# Patient Record
Sex: Female | Born: 1955 | Race: White | Hispanic: No | Marital: Married | Smoking: Never smoker
Health system: Southern US, Community
[De-identification: ages and names within clinical notes are randomized; demographics above are authoritative.]

## PROBLEM LIST (undated history)

## (undated) DIAGNOSIS — K219 Gastro-esophageal reflux disease without esophagitis: Secondary | ICD-10-CM

## (undated) DIAGNOSIS — R58 Hemorrhage, not elsewhere classified: Secondary | ICD-10-CM

## (undated) DIAGNOSIS — K8050 Calculus of bile duct without cholangitis or cholecystitis without obstruction: Secondary | ICD-10-CM

## (undated) DIAGNOSIS — R1011 Right upper quadrant pain: Secondary | ICD-10-CM

## (undated) DIAGNOSIS — G43909 Migraine, unspecified, not intractable, without status migrainosus: Secondary | ICD-10-CM

## (undated) DIAGNOSIS — M1990 Unspecified osteoarthritis, unspecified site: Secondary | ICD-10-CM

## (undated) DIAGNOSIS — Z83719 Family history of colon polyps, unspecified: Secondary | ICD-10-CM

## (undated) DIAGNOSIS — E119 Type 2 diabetes mellitus without complications: Secondary | ICD-10-CM

## (undated) DIAGNOSIS — I2510 Atherosclerotic heart disease of native coronary artery without angina pectoris: Secondary | ICD-10-CM

## (undated) DIAGNOSIS — Z8371 Family history of colonic polyps: Secondary | ICD-10-CM

## (undated) DIAGNOSIS — E785 Hyperlipidemia, unspecified: Secondary | ICD-10-CM

## (undated) DIAGNOSIS — R748 Abnormal levels of other serum enzymes: Secondary | ICD-10-CM

## (undated) DIAGNOSIS — K589 Irritable bowel syndrome without diarrhea: Secondary | ICD-10-CM

## (undated) HISTORY — PX: ABDOMINAL HYSTERECTOMY: SHX81

## (undated) HISTORY — DX: Type 2 diabetes mellitus without complications: E11.9

## (undated) HISTORY — DX: Right upper quadrant pain: R10.11

## (undated) HISTORY — PX: TOTAL ABDOMINAL HYSTERECTOMY: SHX209

## (undated) HISTORY — DX: Irritable bowel syndrome, unspecified: K58.9

## (undated) HISTORY — PX: KNEE ARTHROSCOPY: SUR90

## (undated) HISTORY — DX: Hyperlipidemia, unspecified: E78.5

## (undated) HISTORY — PX: TONSILLECTOMY: SUR1361

## (undated) HISTORY — DX: Family history of colon polyps, unspecified: Z83.719

## (undated) HISTORY — DX: Calculus of bile duct without cholangitis or cholecystitis without obstruction: K80.50

## (undated) HISTORY — DX: Abnormal levels of other serum enzymes: R74.8

## (undated) HISTORY — PX: CARDIAC CATHETERIZATION: SHX172

## (undated) HISTORY — DX: Family history of colonic polyps: Z83.71

## (undated) HISTORY — PX: CARPAL TUNNEL RELEASE: SHX101

## (undated) HISTORY — DX: Atherosclerotic heart disease of native coronary artery without angina pectoris: I25.10

## (undated) HISTORY — DX: Gastro-esophageal reflux disease without esophagitis: K21.9

## (undated) HISTORY — DX: Migraine, unspecified, not intractable, without status migrainosus: G43.909

---

## 1998-11-06 DIAGNOSIS — E119 Type 2 diabetes mellitus without complications: Secondary | ICD-10-CM

## 1998-11-06 HISTORY — DX: Type 2 diabetes mellitus without complications: E11.9

## 2007-03-14 ENCOUNTER — Ambulatory Visit: Payer: Self-pay

## 2008-01-13 ENCOUNTER — Inpatient Hospital Stay: Payer: Self-pay | Admitting: Internal Medicine

## 2008-01-13 ENCOUNTER — Other Ambulatory Visit: Payer: Self-pay

## 2008-03-09 ENCOUNTER — Ambulatory Visit: Payer: Self-pay | Admitting: Rheumatology

## 2008-03-19 ENCOUNTER — Ambulatory Visit: Payer: Self-pay

## 2008-04-03 ENCOUNTER — Ambulatory Visit: Payer: Self-pay | Admitting: Specialist

## 2009-03-24 ENCOUNTER — Ambulatory Visit: Payer: Self-pay

## 2010-03-28 ENCOUNTER — Ambulatory Visit: Payer: Self-pay | Admitting: Unknown Physician Specialty

## 2010-04-14 ENCOUNTER — Ambulatory Visit: Payer: Self-pay

## 2010-12-03 ENCOUNTER — Emergency Department: Payer: Self-pay | Admitting: Emergency Medicine

## 2011-03-11 ENCOUNTER — Observation Stay: Payer: Self-pay | Admitting: Family Medicine

## 2011-03-11 DIAGNOSIS — R079 Chest pain, unspecified: Secondary | ICD-10-CM

## 2011-03-12 DIAGNOSIS — R079 Chest pain, unspecified: Secondary | ICD-10-CM

## 2011-04-19 ENCOUNTER — Ambulatory Visit: Payer: Self-pay

## 2011-06-07 ENCOUNTER — Other Ambulatory Visit: Payer: Self-pay | Admitting: Physician Assistant

## 2012-04-25 ENCOUNTER — Ambulatory Visit: Payer: Self-pay

## 2012-08-06 LAB — URINALYSIS, COMPLETE
Bilirubin,UR: NEGATIVE
Blood: NEGATIVE
Glucose,UR: NEGATIVE mg/dL (ref 0–75)
Ketone: NEGATIVE
Leukocyte Esterase: NEGATIVE
Nitrite: NEGATIVE
Ph: 5 (ref 4.5–8.0)
Protein: NEGATIVE
RBC,UR: <1 /HPF (ref 0–5)
Specific Gravity: 1.011 (ref 1.003–1.030)
Squamous Epithelial: 3
WBC UR: 3 /HPF (ref 0–5)

## 2012-08-06 LAB — COMPREHENSIVE METABOLIC PANEL
Albumin: 4.2 g/dL (ref 3.4–5.0)
Alkaline Phosphatase: 71 U/L (ref 50–136)
Anion Gap: 9 (ref 7–16)
BUN: 8 mg/dL (ref 7–18)
Bilirubin,Total: 0.6 mg/dL (ref 0.2–1.0)
Calcium, Total: 8.8 mg/dL (ref 8.5–10.1)
Chloride: 106 mmol/L (ref 98–107)
Co2: 25 mmol/L (ref 21–32)
Creatinine: 0.48 mg/dL — ABNORMAL LOW (ref 0.60–1.30)
EGFR (African American): >60
EGFR (Non-African Amer.): >60
Glucose: 75 mg/dL (ref 65–99)
Osmolality: 276 (ref 275–301)
Potassium: 3.6 mmol/L (ref 3.5–5.1)
SGOT(AST): 32 U/L (ref 15–37)
SGPT (ALT): 32 U/L (ref 12–78)
Sodium: 140 mmol/L (ref 136–145)
Total Protein: 7.8 g/dL (ref 6.4–8.2)

## 2012-08-06 LAB — CBC
HCT: 36.8 % (ref 35.0–47.0)
HGB: 13.4 g/dL (ref 12.0–16.0)
MCH: 34.6 pg — ABNORMAL HIGH (ref 26.0–34.0)
MCHC: 36.3 g/dL — ABNORMAL HIGH (ref 32.0–36.0)
MCV: 95 fL (ref 80–100)
Platelet: 190 10*3/uL (ref 150–440)
RBC: 3.86 10*6/uL (ref 3.80–5.20)
RDW: 12.5 % (ref 11.5–14.5)
WBC: 10.8 10*3/uL (ref 3.6–11.0)

## 2012-08-06 LAB — LIPASE, BLOOD: Lipase: 187 U/L (ref 73–393)

## 2012-08-07 ENCOUNTER — Observation Stay: Payer: Self-pay

## 2012-08-07 LAB — TSH: Thyroid Stimulating Horm: 2.04 u[IU]/mL

## 2012-08-07 LAB — TROPONIN I
Troponin-I: <0.02 ng/mL
Troponin-I: <0.02 ng/mL

## 2012-08-07 LAB — CK TOTAL AND CKMB (NOT AT ARMC)
CK, Total: 45 U/L (ref 21–215)
CK, Total: 43 U/L (ref 21–215)
CK-MB: 0.7 ng/mL (ref 0.5–3.6)
CK-MB: 0.7 ng/mL (ref 0.5–3.6)

## 2012-08-07 LAB — MAGNESIUM: Magnesium: 1.9 mg/dL

## 2012-08-08 LAB — BASIC METABOLIC PANEL
Anion Gap: 10 (ref 7–16)
BUN: 5 mg/dL — ABNORMAL LOW (ref 7–18)
Calcium, Total: 8 mg/dL — ABNORMAL LOW (ref 8.5–10.1)
Chloride: 108 mmol/L — ABNORMAL HIGH (ref 98–107)
Co2: 25 mmol/L (ref 21–32)
Creatinine: 0.54 mg/dL — ABNORMAL LOW (ref 0.60–1.30)
EGFR (African American): >60
EGFR (Non-African Amer.): >60
Glucose: 83 mg/dL (ref 65–99)
Osmolality: 281 (ref 275–301)
Potassium: 3.3 mmol/L — ABNORMAL LOW (ref 3.5–5.1)
Sodium: 143 mmol/L (ref 136–145)

## 2012-08-08 LAB — CBC WITH DIFFERENTIAL/PLATELET
Basophil #: 0 10*3/uL (ref 0.0–0.1)
Basophil %: 0.6 %
Eosinophil #: 0.2 10*3/uL (ref 0.0–0.7)
Eosinophil %: 2.6 %
HCT: 32.6 % — ABNORMAL LOW (ref 35.0–47.0)
HGB: 11.2 g/dL — ABNORMAL LOW (ref 12.0–16.0)
Lymphocyte #: 2 10*3/uL (ref 1.0–3.6)
Lymphocyte %: 28.6 %
MCH: 32.4 pg (ref 26.0–34.0)
MCHC: 34.2 g/dL (ref 32.0–36.0)
MCV: 95 fL (ref 80–100)
Monocyte #: 0.7 x10 3/mm (ref 0.2–0.9)
Monocyte %: 10 %
Neutrophil #: 4.1 10*3/uL (ref 1.4–6.5)
Neutrophil %: 58.2 %
Platelet: 138 10*3/uL — ABNORMAL LOW (ref 150–440)
RBC: 3.44 10*6/uL — ABNORMAL LOW (ref 3.80–5.20)
RDW: 12.4 % (ref 11.5–14.5)
WBC: 7.1 10*3/uL (ref 3.6–11.0)

## 2012-08-08 LAB — LIPID PANEL
Cholesterol: 97 mg/dL (ref 0–200)
HDL Cholesterol: 27 mg/dL — ABNORMAL LOW (ref 40–60)
Ldl Cholesterol, Calc: 50 mg/dL (ref 0–100)
Triglycerides: 98 mg/dL (ref 0–200)
VLDL Cholesterol, Calc: 20 mg/dL (ref 5–40)

## 2012-08-08 LAB — CK TOTAL AND CKMB (NOT AT ARMC)
CK, Total: 34 U/L (ref 21–215)
CK-MB: <0.5 ng/mL — ABNORMAL LOW (ref 0.5–3.6)

## 2012-08-08 LAB — TROPONIN I: Troponin-I: <0.02 ng/mL

## 2012-08-10 LAB — BASIC METABOLIC PANEL
Anion Gap: 11 (ref 7–16)
BUN: 6 mg/dL — ABNORMAL LOW (ref 7–18)
Calcium, Total: 7.9 mg/dL — ABNORMAL LOW (ref 8.5–10.1)
Chloride: 110 mmol/L — ABNORMAL HIGH (ref 98–107)
Co2: 24 mmol/L (ref 21–32)
Creatinine: 0.46 mg/dL — ABNORMAL LOW (ref 0.60–1.30)
EGFR (African American): >60
EGFR (Non-African Amer.): >60
Glucose: 90 mg/dL (ref 65–99)
Osmolality: 286 (ref 275–301)
Potassium: 3.4 mmol/L — ABNORMAL LOW (ref 3.5–5.1)
Sodium: 145 mmol/L (ref 136–145)

## 2012-08-10 LAB — CBC WITH DIFFERENTIAL/PLATELET
Basophil #: 0 10*3/uL (ref 0.0–0.1)
Basophil %: 0.6 %
Eosinophil #: 0.2 10*3/uL (ref 0.0–0.7)
Eosinophil %: 2.4 %
HCT: 31.6 % — ABNORMAL LOW (ref 35.0–47.0)
HGB: 11.2 g/dL — ABNORMAL LOW (ref 12.0–16.0)
Lymphocyte #: 1.7 10*3/uL (ref 1.0–3.6)
Lymphocyte %: 24.4 %
MCH: 33.2 pg (ref 26.0–34.0)
MCHC: 35.6 g/dL (ref 32.0–36.0)
MCV: 93 fL (ref 80–100)
Monocyte #: 0.6 x10 3/mm (ref 0.2–0.9)
Monocyte %: 8.5 %
Neutrophil #: 4.5 10*3/uL (ref 1.4–6.5)
Neutrophil %: 64.1 %
Platelet: 153 10*3/uL (ref 150–440)
RBC: 3.38 10*6/uL — ABNORMAL LOW (ref 3.80–5.20)
RDW: 12.2 % (ref 11.5–14.5)
WBC: 7 10*3/uL (ref 3.6–11.0)

## 2012-08-13 LAB — PATHOLOGY REPORT

## 2012-10-24 ENCOUNTER — Ambulatory Visit: Payer: Self-pay

## 2013-08-12 ENCOUNTER — Emergency Department: Payer: Self-pay | Admitting: Emergency Medicine

## 2013-08-13 LAB — URINALYSIS, COMPLETE
Bilirubin,UR: NEGATIVE
Blood: NEGATIVE
Glucose,UR: 500 mg/dL (ref 0–75)
Leukocyte Esterase: NEGATIVE
Nitrite: NEGATIVE
Ph: 5 (ref 4.5–8.0)
Protein: 100
RBC,UR: NONE SEEN /HPF (ref 0–5)
Specific Gravity: 1.04 (ref 1.003–1.030)

## 2013-08-13 LAB — COMPREHENSIVE METABOLIC PANEL
Albumin: 4.2 g/dL (ref 3.4–5.0)
Alkaline Phosphatase: 106 U/L (ref 50–136)
Anion Gap: 6 — ABNORMAL LOW (ref 7–16)
BUN: 13 mg/dL (ref 7–18)
Bilirubin,Total: 0.6 mg/dL (ref 0.2–1.0)
Calcium, Total: 9.2 mg/dL (ref 8.5–10.1)
Chloride: 101 mmol/L (ref 98–107)
Co2: 28 mmol/L (ref 21–32)
Creatinine: 0.61 mg/dL (ref 0.60–1.30)
EGFR (African American): >60
EGFR (Non-African Amer.): >60
Glucose: 313 mg/dL — ABNORMAL HIGH (ref 65–99)
Osmolality: 282 (ref 275–301)
Potassium: 3.5 mmol/L (ref 3.5–5.1)
SGOT(AST): 47 U/L — ABNORMAL HIGH (ref 15–37)
SGPT (ALT): 59 U/L (ref 12–78)
Sodium: 135 mmol/L — ABNORMAL LOW (ref 136–145)
Total Protein: 7.9 g/dL (ref 6.4–8.2)

## 2013-08-13 LAB — CBC
HCT: 41.9 % (ref 35.0–47.0)
HGB: 14.6 g/dL (ref 12.0–16.0)
MCH: 32.1 pg (ref 26.0–34.0)
MCHC: 34.9 g/dL (ref 32.0–36.0)
MCV: 92 fL (ref 80–100)
Platelet: 191 10*3/uL (ref 150–440)
RBC: 4.54 10*6/uL (ref 3.80–5.20)
RDW: 12 % (ref 11.5–14.5)
WBC: 9.6 10*3/uL (ref 3.6–11.0)

## 2014-04-03 DIAGNOSIS — R0789 Other chest pain: Secondary | ICD-10-CM | POA: Insufficient documentation

## 2014-05-20 ENCOUNTER — Ambulatory Visit: Payer: Self-pay | Admitting: Internal Medicine

## 2014-06-06 DIAGNOSIS — G43909 Migraine, unspecified, not intractable, without status migrainosus: Secondary | ICD-10-CM | POA: Insufficient documentation

## 2014-07-09 ENCOUNTER — Emergency Department: Payer: Self-pay | Admitting: Emergency Medicine

## 2014-07-09 LAB — COMPREHENSIVE METABOLIC PANEL
Albumin: 4.1 g/dL (ref 3.4–5.0)
Alkaline Phosphatase: 77 U/L
Anion Gap: 14 (ref 7–16)
BUN: 14 mg/dL (ref 7–18)
Bilirubin,Total: 0.6 mg/dL (ref 0.2–1.0)
Calcium, Total: 9.1 mg/dL (ref 8.5–10.1)
Chloride: 102 mmol/L (ref 98–107)
Co2: 21 mmol/L (ref 21–32)
Creatinine: 0.94 mg/dL (ref 0.60–1.30)
EGFR (African American): >60
EGFR (Non-African Amer.): >60
Glucose: 428 mg/dL — ABNORMAL HIGH (ref 65–99)
Osmolality: 293 (ref 275–301)
Potassium: 3.7 mmol/L (ref 3.5–5.1)
SGOT(AST): 50 U/L — ABNORMAL HIGH (ref 15–37)
SGPT (ALT): 83 U/L — ABNORMAL HIGH
Sodium: 137 mmol/L (ref 136–145)
Total Protein: 7.6 g/dL (ref 6.4–8.2)

## 2014-07-09 LAB — CBC WITH DIFFERENTIAL/PLATELET
Basophil #: 0.1 10*3/uL (ref 0.0–0.1)
Basophil %: 0.5 %
Eosinophil #: 0.1 10*3/uL (ref 0.0–0.7)
Eosinophil %: 0.3 %
HCT: 47.5 % — ABNORMAL HIGH (ref 35.0–47.0)
HGB: 15.6 g/dL (ref 12.0–16.0)
Lymphocyte #: 2.2 10*3/uL (ref 1.0–3.6)
Lymphocyte %: 12 %
MCH: 31.6 pg (ref 26.0–34.0)
MCHC: 32.9 g/dL (ref 32.0–36.0)
MCV: 96 fL (ref 80–100)
Monocyte #: 1 x10 3/mm — ABNORMAL HIGH (ref 0.2–0.9)
Monocyte %: 5.2 %
Neutrophil #: 15.2 10*3/uL — ABNORMAL HIGH (ref 1.4–6.5)
Neutrophil %: 82 %
Platelet: 250 10*3/uL (ref 150–440)
RBC: 4.94 10*6/uL (ref 3.80–5.20)
RDW: 12.1 % (ref 11.5–14.5)
WBC: 18.5 10*3/uL — ABNORMAL HIGH (ref 3.6–11.0)

## 2014-07-09 LAB — TROPONIN I: Troponin-I: <0.02 ng/mL

## 2014-07-09 LAB — URINALYSIS, COMPLETE
Bacteria: NONE SEEN
Bilirubin,UR: NEGATIVE
Blood: NEGATIVE
Glucose,UR: >=500 mg/dL (ref 0–75)
Leukocyte Esterase: NEGATIVE
Nitrite: NEGATIVE
Ph: 5 (ref 4.5–8.0)
Protein: NEGATIVE
RBC,UR: 1 /HPF (ref 0–5)
Specific Gravity: 1.035 (ref 1.003–1.030)
Squamous Epithelial: 1
WBC UR: 1 /HPF (ref 0–5)

## 2014-07-09 LAB — LIPASE, BLOOD: Lipase: 137 U/L (ref 73–393)

## 2014-10-21 ENCOUNTER — Ambulatory Visit: Payer: Self-pay | Admitting: Internal Medicine

## 2014-11-06 ENCOUNTER — Ambulatory Visit: Payer: Self-pay | Admitting: Internal Medicine

## 2014-12-07 ENCOUNTER — Ambulatory Visit: Payer: Self-pay | Admitting: Internal Medicine

## 2015-02-10 ENCOUNTER — Emergency Department
Admit: 2015-02-10 | Disposition: A | Discharge status: Home / Self Care | Payer: Self-pay | Admitting: Emergency Medicine

## 2015-02-23 NOTE — Consult Note (Signed)
PATIENT NAME:  Brianna Leon, Brianna Leon MR#:  [id number] DATE OF BIRTH:  12-Oct-1956  DATE OF CONSULTATION:  08/08/2012  REFERRING PHYSICIAN:   CONSULTING PHYSICIAN:  Keturah Barre, NP  PRIMARY CARE PHYSICIAN: Einar Crow, MD  HISTORY OF PRESENT ILLNESS: [name] is a very pleasant 59 year old Caucasian woman who was admitted with abdominal pain for three days. Per report, she was in her usual state of health until three days prior to admission. Gastroenterology has been consulted at the request of Dr. Gaynelle Cage to evaluate the same. Her history is significant for diabetes, hyperlipidemia, and GERD. Her abdominal pain started Sunday evening after a steak and baked potato dinner at [facility]. She does report that last Friday she had some limited nausea, vomiting, and diarrhea which resolved on Saturday. Sunday evening she states she ate steak, baked potato, and a salad with onset of right-sided abdominal pain and nausea that same evening. She states pain progressively increased over Tuesday and Wednesday so she came to the emergency department and was subsequently admitted. She has had gallbladder evaluated with ultrasound and HIDA. There were no signs of gallstones or cholecystitis. The patient does report some cramping during the HIDA test, although this was done without CCK. Her liver panel has been normal and states her pain is better today after pain medications, but nausea has persisted, and is receiving IV Protonix here but for some reason is not taking PPI therapy at home despite history of GERD. She states no NSAID use other than 81 mg of aspirin daily. Greasy foods and salads give her abdominal pain and nausea. She denies black stools, vomiting, bloating, difficulty swallowing, and problems with recent acid reflux. She does have an appointment on 09/10/2012 with Dr. Mechele Collin in regards to 3 or 4 episodes of small amount of bright red rectal bleeding, which has not recurred recently.  She did have EGD in 2001 which found H. pylori gastritis and was treated with Flagyl and Biaxin. She does state that the abdominal pain is more on the side, in the flank, and sometimes in her lower back and it hurts to turn over. She states with onset of lower back pain about a year ago her primary physician ordered an x-ray and prescribed her some pain medications that she states made her ill and no longer takes. She states recent evaluation of ovaries by Dr. Luella Cook due to this complaint and they were normal. She states her kidneys were also evaluated and found to be normal.   REVIEW OF SYSTEMS: A 10-point review is unremarkable, other than what is noted above.     PAST MEDICAL HISTORY:  1. Hyperlipidemia.  2. Diabetes. 3. Gastroesophageal reflux disease.   PAST SURGICAL HISTORY:  1. Tonsillectomy. 2. Hysterectomy. 3. Did undergo cardiac catheterization for some complaints of chest pain by Dr. Juliann Pares approximately 5 to 6 months ago. This revealed no significant coronary artery disease.   SOCIAL HISTORY: She works as a Lawyer, lives with husband. No alcohol, tobacco, or illicits.   FAMILY HISTORY: Significant for colon polyps in her father. No history of colorectal cancer, peptic ulcer disease, or liver disease.   ALLERGIES: Penicillin and Lipitor.   MEDICATIONS:  1. Simvastatin 40 mg p.o. daily.  2. Xanax 0.25 mg p.o. daily p.r.n. 3. Actos 45 mg p.o. daily.  4. ASA 81 mg p.o. daily.  5. Glimepiride p.o. daily.  6. Victoza 1.8 injection daily.   MOST RECENT LABS/RADIOLOGIC STUDIES: Glucose 83, BUN 5, creatinine 0.54, sodium 143, potassium  3.3, chloride 108, GFR greater than 60, and calcium 8. Magnesium 1.5. Lipase 187. Cholesterol panel was normal with the exception of decreased HDL of 27. Her hepatic panel was normal. Cardiac enzyme testing x3 has been negative. TSH 2.04. WBC 7.1, hemoglobin 11.2, hematocrit 32.6, and platelet count 138. Normocytic red cells. Normal distribution of  white cells.   CT of chest was done for concerns of pulmonary embolism. This was not found.  Rib x-rays were negative.  Abdominal ultrasound demonstrated a normal liver, normal gallbladder, and normal size common bile duct.   CT with IV but no oral contrast demonstrated an accessory spleen, was essentially negative in other respects.  HIDA without CCK was done yesterday and had no unusual findings.   PHYSICAL EXAMINATION:   VITAL SIGNS: Most recent vital signs - temperature 97.8, pulse 68, respiratory rate 18, blood pressure 124/72, and oxygen saturation 97%.   GENERAL: Well-appearing, pleasant Caucasian woman lying in bed in no acute distress.   HEENT: Normocephalic, atraumatic. Sclerae anicteric. No redness, drainage, or inflammation to the eyes or nares. Oral mucous membranes are pink and moist.   NECK: Supple. No thyromegaly, JVD, or adenopathy.   CHEST: Respirations eupneic. Lungs are clear to auscultation bilaterally.   CARDIAC: S1 and S2, regular rate and rhythm. No MRG. Peripheral pulses 2+. No appreciable edema.   ABDOMEN: Nondistended. Bowel sounds x4. Minimal tenderness to the right lateral upper quadrant. No guarding, rebound tenderness, peritoneal signs, or hernias.   RECTAL: Stool brown, heme-negative. Nontender.   GENITOURINARY: Deferred.   EXTREMITIES: Moves all extremities well x4. No clubbing or cyanosis. Strength five out of five. Sensation intact.   NEUROLOGICAL: Cranial nerves II through XII grossly intact. Alert and oriented x3. Speech clear. No facial droop.   PSYCHIATRIC: Cooperative, logical thought, good memory skills, pleasant.   IMPRESSION AND PLAN: Sudden onset of right-sided abdominal pain and nausea after fatty meal with negative partial gallbladder work-up. Noted national shortage of CCK. Differentials also include gastritis, biliary colic, and dyskinesia. Agree with PPI therapy, recommend twice a day. Abdomen soft and nondistended with mild right  lateral quadrant tenderness. She is heme-negative. Stool is heme-negative. She may benefit from EGD and H. pylori test. She should keep her appointment with Dr. Mechele CollinElliott regarding her stools. At this point, I do not think that her lower back pain is contributory to this current issue.   These services were provided by Vevelyn Pathristiane Channin Agustin, MSN, NPC in collaboration with Barnetta ChapelMartin Skulskie, MD, with whom I have discussed this patient in full.  ____________________________ Keturah Barrehristiane H. Lashana Spang, NP chl:slb D: 08/09/2012 15:23:00 ET T: 08/09/2012 15:41:32 ET JOB#: [id number]  cc: Keturah Barrehristiane H. Rilan Eiland, NP, <Dictator> Marya AmslerMarshall W. Dareen PianoAnderson, MD [facility] HospitalCHRISTIANE H Colonel Krauser FNP ELECTRONICALLY SIGNED 08/24/2012 7:46

## 2015-02-23 NOTE — Discharge Summary (Signed)
PATIENT NAME:  Brianna Leon, Brianna Leon MR#:  [id number] DATE OF BIRTH:  11-07-1955  DATE OF ADMISSION:  08/07/2012 DATE OF DISCHARGE:  08/10/2012  PRIMARY CARE PHYSICIAN: Dr. Einar CrowMarshall Anderson  CONSULTING GI DOCTOR:  Dr. Marva PandaSkulskie    DISCHARGE DIAGNOSES:  1. Gastritis.  2. Abdominal pain.  3. Diabetes.  HISTORY OF PRESENT ILLNESS:  The patient is a pleasant 59 year old female admitted on 10/02 with a three-day history of progressive abdominal pain. In the ED she had a nondiagnostic CT and ultrasound scan. She was admitted for further evaluation. Her white blood count was slightly elevated at 11 but her comprehensive panel, urine, and EKG were normal. Her lipase was normal.   HOSPITAL COURSE BY ISSUE: .  1. Abdominal pain: The patient had negative urinalysis,  negative right upper quadrant ultrasound, normal white count, negative lipase, normal CT scan. Her troponins were negative. Thyroid was negative. She had a normal HIDA scan. She also had a CT of her chest which was negative for pulmonary embolism or thoracic abnormalities. She was seen by GI who did an endoscopy, which showed a normal esophagus but erosive gastritis and duodenitis. For this she was started on high-dose Protonix 40 mg twice a day. Biopsies are pending. She also had a bone scan done, which revealed some focal increased uptake in the right fourth rib, which may be posttraumatic. There was also some mild increase in the uptake at L5, which suggested degenerative changes. On the day of discharge she was pain free and was tolerating a regular diet. She will be discharged on Protonix. She also started Cymbalta and a diclofenac patch, which helped to control her pain.  2. Diabetes: She was continued on outpatient medications and sliding scale insulin for this.   DISCHARGE DIET: ADA diet.    DISCHARGE MEDICATIONS: .  1. Protonix 40 mg twice a day.  2. Cymbalta 60 mg once a day.  3. Diclofenac patch 1.3%, apply q. 12 hours as needed.   4. Pioglitazone 30 mg once a day.  5. Aspirin 81 mg once a day.  6. Simvastatin 40 mg once a day.  7. Imipramine  2 mg, 1 tablet once a day.  8. Victoza 18 mg per 3 mL, inject 1.8 mg subcutaneous once a day in the evening.  9. Ocuvite.  10. One-A-Day Women's Plus tablet.  DISPOSITION:  Discharge to home.  DISCHARGE ACTIVITY LIMITATIONS: As tolerated. Return to work Monday October 7 if tolerated.   DISCHARGE FOLLOWUP:  The  patient will follow up with Dr. Dareen PianoAnderson in 1 to 2 weeks and with Dr. Marva PandaSkulskie in GI within 2 to 4 weeks.   CONDITION ON DISCHARGE: Good.  TIME SPENT: 40 minutes.  ____________________________ Stann Mainlandavid P. Sampson GoonFitzgerald, MD dpf:bjt D: 08/11/2012 12:03:17 ET T: 08/11/2012 12:58:46 ET JOB#: [id number]  cc: Stann Mainlandavid P. Sampson GoonFitzgerald, MD, <Dictator> Marya AmslerMarshall W. Dareen PianoAnderson, MD Maysie Parkhill Sampson GoonFITZGERALD MD ELECTRONICALLY SIGNED 08/13/2012 21:46

## 2015-02-23 NOTE — H&P (Signed)
PATIENT NAME:  Brianna Leon, Brianna Leon MR#:  [id number] DATE OF BIRTH:  25-Apr-1956  DATE OF ADMISSION:  08/06/2012  PRIMARY CARE PHYSICIAN: Dr[facility] Einar CrowMarshall Anderson, [facility]  ER PHYSICIAN: Dr. Manson PasseyBrown  ADMITTING PHYSICIAN: Dr. Tilda FrancoAkuneme   PRESENTING COMPLAINT: Abdominal pain over a three day period.   HISTORY: Patient is a 59 year old lady who was in her usual state of health until three days ago when she started having progressive abdominal pain. Pain is sharp in nature, right-sided, radiating from the medial towards the flank in right upper quadrant area. She states nausea but no vomiting. Denies any fever. No loss of consciousness. Denied PND, orthopnea, pedal edema. No rashes or trauma to the area. No recent long travel or sick contact. No recent change in medication. Of note five days ago patient had episode of diarrhea with vomiting. This lasted over a 48 hour period with persistent nausea, vomiting. Symptoms resolved three days ago then soon afterwards this ongoing pain started. Initially intensity was about 4/5 but now has progressed to 9/10. Admits to occasional dizziness, but no loss of consciousness. No dysuria or frequency. No similar episodes of illness in the family or in the past. For this she had a CT abdomen here in the ER which was nondiagnostic and had a right upper quadrant ultrasound which shows no gallbladder or liver pathology. She was referred to hospitalist for further evaluation and pain control. Patient stated that pain gets worse when she eats greasy food. No known relieving factors.  REVIEW OF SYSTEMS: CONSTITUTIONAL: Denies fever, fatigue, weakness. Positive for weight loss but no weight gain. EYES: No redness or blurred vision. ENT: No tinnitus, epistaxis or difficulty swallowing. RESPIRATORY: No cough, wheezing. CARDIOVASCULAR: Denies any chest pain, PND, orthopnea, arrhythmia but admits to occasional exertional dizziness over the last few days. GASTROINTESTINAL: Positive for  nausea but no recent vomiting or diarrhea. Positive for abdominal pain which is mostly right sided. Denies hematemesis or change in bowel habits. GENITOURINARY: Denies dysuria, frequency, or incontinence. ENDOCRINE: No polyuria, polydipsia, heat or cold intolerance. HEMATOLOGIC: Denies anemia, easy bruising, bleeding, or swollen glands. SKIN: No rashes, change in hair or skin texture. MUSCULOSKELETAL: No joint pain, redness, swelling, or limited activity. NEURO: No numbness, weakness, tremors or vertigo. PSYCH: No anxiety or depression.   PAST MEDICAL HISTORY:  1. Hyperlipidemia.  2. Type 2 diabetes.  3. Gastroesophageal reflux disease.  PAST SURGICAL HISTORY:  1. Tonsillectomy.  2. Hysterectomy.  3. Patient had a cardiac cauterization about five months ago which showed no significant coronary artery disease.    SOCIAL HISTORY: Patient works as a LawyerCNA. No alcohol, tobacco, or recreational drug use. Lives at home with her family.   FAMILY HISTORY: Positive for coronary artery disease in her father.   ALLERGIES: Penicillin which gives her nausea, vomiting. Lipitor which gives her muscle weakness and GI upset.   MEDICATIONS:  1. Simvastatin 40 mg daily.  2. Alprazolam 0.25 mg daily p.r.n.  3. Protonix 40 mg daily.  4. Actos 45 mg daily.  5. Aspirin 81 mg a day.   PHYSICAL EXAMINATION:  VITAL SIGNS: Temperature 98.4, pulse 83, respiratory rate 18, blood pressure 122/57, oxygen saturation 99% on room.   GENERAL: Middle age lady, overweight, lying on the gurney awake, alert, oriented to time, place and person, in no distress.   HEENT: Atraumatic, normocephalic. Pupils equal, reactive to light and accommodation. Extraocular movements intact. Mucous membranes pink, moist.   NECK: Supple. No JV distention.   CHEST: Good air entry.  Clear to auscultation.   HEART: Regular rate and rhythm. No murmur.   ABDOMEN: Full, moves with respiration. Has right lower and right upper quadrant  tenderness. No guarding. Bowel sounds normoactive. No organomegaly.   EXTREMITIES: No edema. No clubbing. No deformity.   NEUROLOGICAL: No focal deficits.   PSYCH: Affect appropriate to situation.   LABORATORY, DIAGNOSTIC AND RADIOLOGICAL DATA: There is no EKG. CT abdomen and pelvis shows no acute abnormality. Right upper quadrant ultrasound shows no liver or gallbladder pathology. CBC: White count 11, hemoglobin 13, platelets 190. Chemistry shows sodium 140, potassium 3.6, creatinine 0.4, BUN 8, anion gap 9, glucose 75, calcium 8.8, lipase 187. LFTs normal. Urinalysis was negative for nitrites or leukocyte esterase, WBCs 3 and bacteria 3. Data also shows echocardiogram from one year ago which was normal. Lipase 187.   IMPRESSION:  1. Intractable pain with nausea and vomiting, query cause.  2. Type 2 diabetes, stable.  3. Hyperlipidemia, stable.  4. Gastroesophageal reflux disease.   PLAN: Admit to general medical floor under observation for serial cardiac enzymes. Check TSH, magnesium. Consider HIDA scan. IV fluid for rehydration, n.p.o. for now. Stool for Clostridium difficile. Protonix for GI prophylaxis. Deep vein thrombosis prophylaxis with Lovenox. CODE STATUS: FULL CODE. Will transfer to Dr. Ewell Poe service in the morning for possible HIDA scan.   TOTAL PATIENT CARE TIME: 50 minutes.   ____________________________ Floy Sabina Tilda Franco, MD mia:cms D: 08/07/2012 05:47:17 ET T: 08/07/2012 07:29:03 ET  JOB#: [id number] cc: Tammi Boulier I. Tilda Franco, MD, <Dictator> Marya Amsler. Dareen Piano, MD Margaret Pyle MD ELECTRONICALLY SIGNED 08/09/2012 0:24

## 2015-02-23 NOTE — Consult Note (Signed)
Brief Consult Note: Diagnosis: abdominal pain.   Patient was seen by consultant.   Consult note dictated.   Comments: Appreciate consult for 59 y/o caucasian woman with history of DM, HL, GERD, admitted with abdominal pain and nausea after steak dinner at [facility], for evaluation of the same. Patient reports that last Friday she had some limited NVD which resolved on Saturday. Sunday evening ate steak, baked potato, and salad, with onset of right sided abdominal pain & nausea that same evening. States pain progressively increased over Tues/Wed, so she came to the ED. Has had gallbladder evaluated with US and HIDA. No signs of gallstones/cholecystitis were found, but patient does report cramping during HIDA test, although done without CCK. Liver panel normal.Pain better today after pain meds, but nausea persistent. Is receiving Protonix IV here, but for some reason is not taking this at home, despite history of GERD. States no NSAID use other than 81mg  po daily. Greasy foods and salads give her abdominal pain and nausea.  Denies black stools, vomiting, bloating, difficulty swallowing, problems with recent acid reflux. Has appt 11/5 w/ Dr Markham JordanElliot in regards to 3 or 4 episodes of small amt bright red rectal bleeding.  Did have EGD 2001 that found H pylori gastritis, treated with Flagyl/Biaxin. Does state that the abdominal pain is more on the side and the flank, sometimes in her lower back, hurts to turn over. States onset of lower back pain about 2953yr ago, pmd ordered Xray and rx;d pain medication.States  recent eval of  ovaries by Dr Luella Cookosenow d/t this complaint, & are normal;  kidneys were also eval'd  normal.   Impression/ plan: sudden onset of abd pain, nausea after fatty meal, w/ negative partial gb wkup. Noted nt'l shortage of CCK. Diffs also include gastritis, biliary colic/dyskinesia. Agree w/ PPI therapy, rec bid. Abd soft/nondistended with mild right lateral quadrant tenderness. She is heme  negative. Stool is  heme neg. May benefit from EGD, H pylori test. Should keep appt w/ Dr Markham JordanElliot re stools Do not think that lower back pain is contributory to this current episode..  Electronic Signatures: Vevelyn PatLondon, Rucker Pridgeon H (NP)  (Signed 03-Oct-13 14:50)  Authored: Brief Consult Note   Last Updated: 03-Oct-13 14:50 by Keturah BarreLondon, Kordelia Severin H (NP)

## 2015-02-23 NOTE — Consult Note (Signed)
Brief Consult Note: Diagnosis: right upper and right lower abdominal apin, n/v.   Patient was seen by consultant.   Consult note dictated.   Recommend to proceed with surgery or procedure.   Recommend further assessment or treatment.   Orders entered.   Comments: Patient seen and examined, please see full GI consult.  Patietn admitted with n/v ruq/right abd pain.  Multiple studies uninformative, unfortunately CCK not available to test biliary dyskinesia.  Pain is medial to lateral ruq.  There is a fhx of gallbladder problems.  Will check h.pylori stool antigen and arrange for egd.  I have discussed the risks benefits and complications of egd to include not limited to bleding infection perforation and sedation and she wishes to proceed.  Will increase ppi to bid. I have discussed the risks benefits and complications of egd to include not limited to bleeding infection perforation and sedation and she wishes to proceed.  Electronic Signatures: Barnetta ChapelSkulskie, Martin (MD)  (Signed 03-Oct-13 17:29)  Authored: Brief Consult Note   Last Updated: 03-Oct-13 17:29 by Barnetta ChapelSkulskie, Martin (MD)

## 2015-02-23 NOTE — Consult Note (Signed)
Chief Complaint:   Subjective/Chief Complaint continues with milder nausea no emesis, continued right abdominal pain, less than on admission, mostly ruq toward epigastrum   VITAL SIGNS/ANCILLARY NOTES: **Vital Signs.:   04-Oct-13 07:30   Vital Signs Type Routine   Temperature Temperature (F) 97.9   Celsius 36.6   Temperature Source Oral   Pulse Pulse 59   Respirations Respirations 20   Systolic BP Systolic BP 502   Diastolic BP (mmHg) Diastolic BP (mmHg) 68   Mean BP 82   Pulse Ox % Pulse Ox % 94   Pulse Ox Activity Level  At rest   Oxygen Delivery Room Air/ 21 %  *Intake and Output.:   04-Oct-13 04:25   Grand Totals Intake:   Output:  125    Net:  -125 24 Hr.:  -100   Weight Type daily   Weight Method Bed   Current Weight (lbs) (lbs) 169   Current Weight (kg) (kg) 76.6   Height (ft) (feet) 5   Height (in) (in) 3   Height (cm) centimeters 160   BSA (m2) 1.7   BMI (kg/m2) 29.9   Urine ml     Out:  125   Urinary Method  Void; Up to BR   Brief Assessment:   Cardiac Regular    Respiratory clear BS    Gastrointestinal details normal Soft  Nondistended  No masses palpable  Bowel sounds normal  No rebound tenderness  tender to palpation right abdomen, mostly ruq toward epigastrum.   Lab Results: Routine Chem:  03-Oct-13 04:15    Glucose, Serum 83   BUN  5   Creatinine (comp)  0.54   Sodium, Serum 143   Potassium, Serum  3.3   Chloride, Serum  108   CO2, Serum 25   Calcium (Total), Serum  8.0   Anion Gap 10   Osmolality (calc) 281   eGFR (African American) >60   eGFR (Non-African American) >60 (eGFR values <8m/min/1.73 m2 may be an indication of chronic kidney disease (CKD). Calculated eGFR is useful in patients with stable renal function. The eGFR calculation will not be reliable in acutely ill patients when serum creatinine is changing rapidly. It is not useful in  patients on dialysis. The eGFR calculation may not be applicable to patients at the low and  high extremes of body sizes, pregnant women, and vegetarians.)   Cholesterol, Serum 97   Triglycerides, Serum 98   HDL (INHOUSE)  27   VLDL Cholesterol Calculated 20   LDL Cholesterol Calculated 50 (Result(s) reported on 08 Aug 2012 at 05:50AM.)  Routine Hem:  03-Oct-13 04:15    WBC (CBC) 7.1   RBC (CBC)  3.44   Hemoglobin (CBC)  11.2   Hematocrit (CBC)  32.6   Platelet Count (CBC)  138   MCV 95   MCH 32.4   MCHC 34.2   RDW 12.4   Neutrophil % 58.2   Lymphocyte % 28.6   Monocyte % 10.0   Eosinophil % 2.6   Basophil % 0.6   Neutrophil # 4.1   Lymphocyte # 2.0   Monocyte # 0.7   Eosinophil # 0.2   Basophil # 0.0 (Result(s) reported on 08 Aug 2012 at 05:39AM.)   Assessment/Plan:  Assessment/Plan:   Assessment 1) right abdominal pain as above, uncertain etiology. multiple tests uninformative.    Plan 1) EGD today.  I have discussed the risks benefits and complicatiosn of egd to include not limited to bleeding infection perforation and  sedation and she wishes to proceed. Further recs to follow.   Electronic Signatures: Loistine Simas (MD)  (Signed 04-Oct-13 14:50)  Authored: Chief Complaint, VITAL SIGNS/ANCILLARY NOTES, Brief Assessment, Lab Results, Assessment/Plan   Last Updated: 04-Oct-13 14:50 by Loistine Simas (MD)

## 2015-03-26 ENCOUNTER — Other Ambulatory Visit: Payer: Self-pay | Admitting: Nurse Practitioner

## 2015-03-26 DIAGNOSIS — Z87898 Personal history of other specified conditions: Secondary | ICD-10-CM | POA: Insufficient documentation

## 2015-03-26 DIAGNOSIS — R1011 Right upper quadrant pain: Secondary | ICD-10-CM

## 2015-03-26 DIAGNOSIS — Z8371 Family history of colonic polyps: Secondary | ICD-10-CM | POA: Insufficient documentation

## 2015-03-26 DIAGNOSIS — R110 Nausea: Secondary | ICD-10-CM

## 2015-03-26 DIAGNOSIS — R748 Abnormal levels of other serum enzymes: Secondary | ICD-10-CM | POA: Insufficient documentation

## 2015-03-26 LAB — HM HEPATITIS C SCREENING LAB: HM Hepatitis Screen: NEGATIVE

## 2015-04-02 ENCOUNTER — Other Ambulatory Visit: Payer: Self-pay

## 2015-04-06 ENCOUNTER — Ambulatory Visit
Admission: RE | Admit: 2015-04-06 | Discharge: 2015-04-06 | Disposition: A | Discharge status: Home / Self Care | Payer: BLUE CROSS/BLUE SHIELD | Source: Ambulatory Visit | Attending: Nurse Practitioner | Admitting: Nurse Practitioner

## 2015-04-06 DIAGNOSIS — R110 Nausea: Secondary | ICD-10-CM | POA: Diagnosis present

## 2015-04-06 DIAGNOSIS — R1011 Right upper quadrant pain: Secondary | ICD-10-CM

## 2015-04-06 MED ORDER — SINCALIDE 5 MCG IJ SOLR
0.0200 ug/kg | Freq: Once | INTRAMUSCULAR | Status: AC
  Administered 2015-04-06: 1.4 ug via INTRAVENOUS
  Filled 2015-04-06: qty 5

## 2015-04-06 MED ORDER — TECHNETIUM TC 99M MEBROFENIN IV KIT
5.2400 | PACK | Freq: Once | INTRAVENOUS | Status: AC | PRN
  Administered 2015-04-06: 5.24 via INTRAVENOUS

## 2015-05-07 LAB — HM PAP SMEAR: HM Pap smear: NORMAL

## 2015-05-18 HISTORY — PX: ESOPHAGOGASTRODUODENOSCOPY: SHX1529

## 2015-05-18 HISTORY — PX: COLONOSCOPY: SHX5424

## 2015-05-18 LAB — HM COLONOSCOPY

## 2015-06-17 DIAGNOSIS — Z0000 Encounter for general adult medical examination without abnormal findings: Secondary | ICD-10-CM | POA: Insufficient documentation

## 2015-07-09 ENCOUNTER — Encounter: Payer: Self-pay | Admitting: *Deleted

## 2016-10-24 ENCOUNTER — Encounter: Payer: Self-pay | Admitting: Emergency Medicine

## 2016-10-24 ENCOUNTER — Emergency Department
Admission: EM | Admit: 2016-10-24 | Discharge: 2016-10-24 | Disposition: A | Discharge status: Home / Self Care | Payer: BLUE CROSS/BLUE SHIELD | Attending: Emergency Medicine | Admitting: Emergency Medicine

## 2016-10-24 DIAGNOSIS — R112 Nausea with vomiting, unspecified: Secondary | ICD-10-CM | POA: Diagnosis not present

## 2016-10-24 DIAGNOSIS — R42 Dizziness and giddiness: Secondary | ICD-10-CM | POA: Diagnosis not present

## 2016-10-24 DIAGNOSIS — R1011 Right upper quadrant pain: Secondary | ICD-10-CM | POA: Insufficient documentation

## 2016-10-24 LAB — CBC WITH DIFFERENTIAL/PLATELET
Basophils Absolute: 0 10*3/uL (ref 0–0.1)
Basophils Relative: 1 %
Eosinophils Absolute: 0 10*3/uL (ref 0–0.7)
Eosinophils Relative: 0 %
HCT: 40.5 % (ref 35.0–47.0)
Hemoglobin: 14.2 g/dL (ref 12.0–16.0)
Lymphocytes Relative: 13 %
Lymphs Abs: 1.1 10*3/uL (ref 1.0–3.6)
MCH: 32.1 pg (ref 26.0–34.0)
MCHC: 35.1 g/dL (ref 32.0–36.0)
MCV: 91.4 fL (ref 80.0–100.0)
Monocytes Absolute: 0.3 10*3/uL (ref 0.2–0.9)
Monocytes Relative: 4 %
Neutro Abs: 6.8 10*3/uL — ABNORMAL HIGH (ref 1.4–6.5)
Neutrophils Relative %: 82 %
Platelets: 199 10*3/uL (ref 150–440)
RBC: 4.43 MIL/uL (ref 3.80–5.20)
RDW: 12.3 % (ref 11.5–14.5)
WBC: 8.3 10*3/uL (ref 3.6–11.0)

## 2016-10-24 LAB — COMPREHENSIVE METABOLIC PANEL
ALT: 28 U/L (ref 14–54)
AST: 23 U/L (ref 15–41)
Albumin: 4.7 g/dL (ref 3.5–5.0)
Alkaline Phosphatase: 55 U/L (ref 38–126)
Anion gap: 10 (ref 5–15)
BUN: 13 mg/dL (ref 6–20)
CO2: 25 mmol/L (ref 22–32)
Calcium: 9.2 mg/dL (ref 8.9–10.3)
Chloride: 100 mmol/L — ABNORMAL LOW (ref 101–111)
Creatinine, Ser: 0.46 mg/dL (ref 0.44–1.00)
GFR calc Af Amer: >60 mL/min (ref >60)
GFR calc non Af Amer: >60 mL/min (ref >60)
Glucose, Bld: 358 mg/dL — ABNORMAL HIGH (ref 65–99)
Potassium: 3.9 mmol/L (ref 3.5–5.1)
Sodium: 135 mmol/L (ref 135–145)
Total Bilirubin: 0.9 mg/dL (ref 0.3–1.2)
Total Protein: 8 g/dL (ref 6.5–8.1)

## 2016-10-24 LAB — TROPONIN I: Troponin I: <0.03 ng/mL (ref <0.03)

## 2016-10-24 MED ORDER — MECLIZINE HCL 25 MG PO TABS: 25.0000 mg | ORAL_TABLET | Freq: Three times a day (TID) | ORAL | 0 refills | Status: DC | PRN

## 2016-10-24 MED ORDER — ONDANSETRON 4 MG PO TBDP: 4.0000 mg | ORAL_TABLET | Freq: Three times a day (TID) | ORAL | 0 refills | Status: DC | PRN

## 2016-10-24 MED ORDER — BLOOD GLUCOSE MONITOR KIT: PACK | 0 refills | Status: DC

## 2016-10-24 MED ORDER — MECLIZINE HCL 25 MG PO TABS
25.0000 mg | ORAL_TABLET | Freq: Once | ORAL | Status: AC
  Administered 2016-10-24: 25 mg via ORAL
  Filled 2016-10-24: qty 1

## 2016-10-24 NOTE — ED Triage Notes (Signed)
C/o nausea and states when she stands or moves her head the room spins.

## 2016-10-24 NOTE — ED Notes (Signed)
Signature pad would only open "read only view" so signature page printed out and pt signed on paper and placed with chart

## 2016-10-24 NOTE — ED Provider Notes (Signed)
[facility] Emergency Department Provider Note  Time seen: 1:18 PM  I have reviewed the triage vital signs and the nursing notes.   HISTORY  Chief Complaint Dizziness    HPI Brianna Leon is a 60 y.o. female who presents to the emergency department for dizziness. According to the patient she states she was feeling well until this morning when she rolled over to turn off her alarm clock. She states acute onset of dizziness like the room was spinning around her. Became very nauseated. States she was able to get up after resting for several minutes she went to her mother's house to unlock the door for her caregiver however once again became extremely dizzy after bending over. Patient states she then became very nauseated with several episodes of vomiting. Denies any headache. Denies focal weakness or numbness. Denies history of vertigo.  History reviewed. No pertinent past medical history.  There are no active problems to display for this patient.   Past Surgical History:  Procedure Laterality Date  . ABDOMINAL HYSTERECTOMY  22 years ago    Prior to Admission medications   Not on File    Allergies  Allergen Reactions  . Metformin And Related Diarrhea  . Penicillins Nausea And Vomiting    No family history on file.  Social History Social History  Substance Use Topics  . Smoking status: Never Smoker  . Smokeless tobacco: Never Used  . Alcohol use Not on file    Review of Systems Constitutional: Negative for feverIntermittent dizziness. Denies any currently. Cardiovascular: Negative for chest pain. Respiratory: Negative for shortness of breath. Gastrointestinal: Negative for abdominal pain. Positive for nausea and vomiting, now resolved. Neurological: Negative for headaches, focal weakness or numbness. 10-point ROS otherwise negative.  ____________________________________________   PHYSICAL EXAM:  VITAL SIGNS: ED Triage Vitals  Enc  Vitals Group     BP 10/24/16 1017 123/60     Pulse Rate 10/24/16 1017 85     Resp 10/24/16 1017 18     Temp 10/24/16 1017 97.7 F (36.5 C)     Temp Source 10/24/16 1017 Oral     SpO2 10/24/16 1017 98 %     Weight 10/24/16 1008 130 lb (59 kg)     Height 10/24/16 1008 5\' 2"  (1.575 m)     Head Circumference --      Peak Flow --      Pain Score 10/24/16 1008 5     Pain Loc --      Pain Edu? --      Excl. in GC? --     Constitutional: Alert and oriented. Well appearing and in no distress. Eyes: Normal exam ENT   Head: Normocephalic and atraumatic   Mouth/Throat: Mucous membranes are moist. Cardiovascular: Normal rate, regular rhythm. No murmur Respiratory: Normal respiratory effort without tachypnea nor retractions. Breath sounds are clear Gastrointestinal: Soft, slight right upper quadrant tenderness to palpation which the patient states is chronic. No rebound or guarding. No distention. Musculoskeletal: Nontender with normal range of motion in all extremities. Neurologic:  Normal speech and language. No gross focal neurologic deficits Skin:  Skin is warm, dry and intact.  Psychiatric: Mood and affect are normal. Speech and behavior are normal.   ____________________________________________    EKG  EKG reviewed and interpreted by myself shows normal sinus rhythm at 85 bpm, narrow QRS, normal axis, normal intervals, no concerning ST changes.  ____________________________________________    INITIAL IMPRESSION / ASSESSMENT AND PLAN / ED COURSE  Pertinent labs & imaging results that were available during my care of the patient were reviewed by me and considered in my medical decision making (see chart for details).  The patient presents the emergency department with dizziness which started after rolling over this morning, exacerbated by certain positions/movements. Patient's symptoms are very suggestive of BPPV. Patient's labs are largely within normal limits besides an  elevated blood glucose. Patient is supposed be taking Lantus at night but lost her glucometer several weeks ago and has not been taking it. Patient's anion gap is normal. I discussed with the patient the need to restart her nighttime Lantus today. I will write her for a glucometer. We will also discharge the patient with meclizine and ENT follow-up. I discussed return precautions for any increased/persistent dizziness, weakness, numbness or headache. Patient agreeable plan.  ____________________________________________   FINAL CLINICAL IMPRESSION(S) / ED DIAGNOSES  Vertigo    Minna AntisKevin Elasha Tess, MD 10/24/16 1323

## 2016-10-24 NOTE — ED Notes (Signed)
Pt to room in wheelchair, standing beside bed to change with no assistance. Pt states "time to time I have frequent pain in my R side." states last night she was hurting in shoulder and back and didn't feel good, woke up this morning and felt dizzy. Was walking around trying to feed cats and states she felt like she was going to pass out. Went inside and tried to drink something. Then began throwing up. Movement made her dizzy and vomit.

## 2016-10-24 NOTE — Discharge Instructions (Signed)
As we discussed please take your meclizine as needed for dizziness. Please follow-up with ENT if you continue to have symptoms in the next 1-2 days. Return to the emergency department for any persistent dizziness, weakness/numbness of any arm or leg, or headache.

## 2016-10-24 NOTE — ED Notes (Addendum)
Pt states R sided pain more with eating, states still has gallbladder, been told she doesn't have gallstones.   Pt states since being here she is feeling better.

## 2016-11-28 ENCOUNTER — Other Ambulatory Visit: Payer: Self-pay | Admitting: Obstetrics & Gynecology

## 2016-11-28 DIAGNOSIS — I2510 Atherosclerotic heart disease of native coronary artery without angina pectoris: Secondary | ICD-10-CM | POA: Diagnosis not present

## 2016-11-28 DIAGNOSIS — E7800 Pure hypercholesterolemia, unspecified: Secondary | ICD-10-CM | POA: Diagnosis not present

## 2016-11-28 DIAGNOSIS — Z1211 Encounter for screening for malignant neoplasm of colon: Secondary | ICD-10-CM | POA: Diagnosis not present

## 2016-11-28 DIAGNOSIS — I10 Essential (primary) hypertension: Secondary | ICD-10-CM | POA: Diagnosis not present

## 2016-11-28 DIAGNOSIS — R079 Chest pain, unspecified: Secondary | ICD-10-CM | POA: Diagnosis not present

## 2016-11-28 DIAGNOSIS — Z01419 Encounter for gynecological examination (general) (routine) without abnormal findings: Secondary | ICD-10-CM | POA: Diagnosis not present

## 2016-11-28 DIAGNOSIS — Z0100 Encounter for examination of eyes and vision without abnormal findings: Secondary | ICD-10-CM | POA: Diagnosis not present

## 2016-11-28 DIAGNOSIS — Z794 Long term (current) use of insulin: Secondary | ICD-10-CM | POA: Diagnosis not present

## 2016-11-28 DIAGNOSIS — Z1231 Encounter for screening mammogram for malignant neoplasm of breast: Secondary | ICD-10-CM

## 2016-11-28 DIAGNOSIS — E119 Type 2 diabetes mellitus without complications: Secondary | ICD-10-CM | POA: Diagnosis not present

## 2016-12-04 DIAGNOSIS — E119 Type 2 diabetes mellitus without complications: Secondary | ICD-10-CM | POA: Diagnosis not present

## 2016-12-04 DIAGNOSIS — I10 Essential (primary) hypertension: Secondary | ICD-10-CM | POA: Diagnosis not present

## 2016-12-04 DIAGNOSIS — I2510 Atherosclerotic heart disease of native coronary artery without angina pectoris: Secondary | ICD-10-CM | POA: Diagnosis not present

## 2016-12-04 DIAGNOSIS — G43909 Migraine, unspecified, not intractable, without status migrainosus: Secondary | ICD-10-CM | POA: Diagnosis not present

## 2017-01-02 ENCOUNTER — Ambulatory Visit
Admission: RE | Admit: 2017-01-02 | Discharge: 2017-01-02 | Disposition: A | Discharge status: Home / Self Care | Payer: BLUE CROSS/BLUE SHIELD | Source: Ambulatory Visit | Attending: Obstetrics & Gynecology | Admitting: Obstetrics & Gynecology

## 2017-01-02 ENCOUNTER — Encounter: Payer: Self-pay | Admitting: Radiology

## 2017-01-02 DIAGNOSIS — Z1231 Encounter for screening mammogram for malignant neoplasm of breast: Secondary | ICD-10-CM | POA: Insufficient documentation

## 2017-04-03 DIAGNOSIS — G43909 Migraine, unspecified, not intractable, without status migrainosus: Secondary | ICD-10-CM | POA: Diagnosis not present

## 2017-04-03 DIAGNOSIS — E119 Type 2 diabetes mellitus without complications: Secondary | ICD-10-CM | POA: Diagnosis not present

## 2017-04-03 DIAGNOSIS — Z794 Long term (current) use of insulin: Secondary | ICD-10-CM | POA: Diagnosis not present

## 2017-04-03 DIAGNOSIS — I2510 Atherosclerotic heart disease of native coronary artery without angina pectoris: Secondary | ICD-10-CM | POA: Diagnosis not present

## 2017-04-03 LAB — LIPID PANEL
Cholesterol: 216 — AB (ref 0–200)
HDL: 45 (ref 35–70)
LDL Cholesterol: 149
Triglycerides: 111 (ref 40–160)

## 2017-04-03 LAB — BASIC METABOLIC PANEL
BUN: 18 (ref 4–21)
Creatinine: 0.5 (ref 0.5–1.1)
Glucose: 290
Potassium: 4.9 (ref 3.4–5.3)
Sodium: 139 (ref 137–147)

## 2017-04-03 LAB — HEMOGLOBIN A1C: Hemoglobin A1C: 9.4

## 2017-04-03 LAB — HEPATIC FUNCTION PANEL
ALT: 18 (ref 7–35)
AST: 14 (ref 13–35)
Alkaline Phosphatase: 48 (ref 25–125)
Bilirubin, Total: 0.7

## 2017-04-03 LAB — MICROALBUMIN, URINE: Microalb, Ur: <7

## 2017-04-30 DIAGNOSIS — E785 Hyperlipidemia, unspecified: Secondary | ICD-10-CM | POA: Insufficient documentation

## 2017-04-30 DIAGNOSIS — I2510 Atherosclerotic heart disease of native coronary artery without angina pectoris: Secondary | ICD-10-CM | POA: Insufficient documentation

## 2017-04-30 DIAGNOSIS — I10 Essential (primary) hypertension: Secondary | ICD-10-CM

## 2017-04-30 DIAGNOSIS — I129 Hypertensive chronic kidney disease with stage 1 through stage 4 chronic kidney disease, or unspecified chronic kidney disease: Secondary | ICD-10-CM | POA: Insufficient documentation

## 2017-04-30 DIAGNOSIS — K219 Gastro-esophageal reflux disease without esophagitis: Secondary | ICD-10-CM | POA: Insufficient documentation

## 2017-04-30 DIAGNOSIS — E119 Type 2 diabetes mellitus without complications: Secondary | ICD-10-CM | POA: Insufficient documentation

## 2017-06-11 ENCOUNTER — Encounter: Payer: Self-pay | Admitting: General Surgery

## 2017-06-11 ENCOUNTER — Other Ambulatory Visit: Payer: Self-pay | Admitting: Family Medicine

## 2017-06-11 ENCOUNTER — Ambulatory Visit
Admission: RE | Admit: 2017-06-11 | Discharge: 2017-06-11 | Disposition: A | Discharge status: Home / Self Care | Payer: BLUE CROSS/BLUE SHIELD | Source: Ambulatory Visit | Attending: Family Medicine | Admitting: Family Medicine

## 2017-06-11 ENCOUNTER — Ambulatory Visit (INDEPENDENT_AMBULATORY_CARE_PROVIDER_SITE_OTHER): Payer: BLUE CROSS/BLUE SHIELD | Admitting: Family Medicine

## 2017-06-11 ENCOUNTER — Encounter: Payer: Self-pay | Admitting: Family Medicine

## 2017-06-11 VITALS — BP 118/73 | HR 75 | Temp 98.3°F | Ht 62.7 in | Wt 131.1 lb

## 2017-06-11 DIAGNOSIS — E118 Type 2 diabetes mellitus with unspecified complications: Secondary | ICD-10-CM | POA: Diagnosis not present

## 2017-06-11 DIAGNOSIS — R918 Other nonspecific abnormal finding of lung field: Secondary | ICD-10-CM | POA: Diagnosis not present

## 2017-06-11 DIAGNOSIS — E782 Mixed hyperlipidemia: Secondary | ICD-10-CM | POA: Diagnosis not present

## 2017-06-11 DIAGNOSIS — R634 Abnormal weight loss: Secondary | ICD-10-CM

## 2017-06-11 DIAGNOSIS — I10 Essential (primary) hypertension: Secondary | ICD-10-CM | POA: Diagnosis not present

## 2017-06-11 DIAGNOSIS — R1011 Right upper quadrant pain: Secondary | ICD-10-CM

## 2017-06-11 DIAGNOSIS — K219 Gastro-esophageal reflux disease without esophagitis: Secondary | ICD-10-CM | POA: Diagnosis not present

## 2017-06-11 DIAGNOSIS — R0781 Pleurodynia: Secondary | ICD-10-CM

## 2017-06-11 MED ORDER — SUCRALFATE 1 GM/10ML PO SUSP: 1.0000 g | Freq: Three times a day (TID) | ORAL | 1 refills | Status: DC

## 2017-06-11 NOTE — Patient Instructions (Addendum)
Gastroparesis °Gastroparesis, also called delayed gastric emptying, is a condition in which food takes longer than normal to empty from the stomach. The condition is usually long-lasting (chronic). °What are the causes? °This condition may be caused by: °· An endocrine disorder, such as hypothyroidism or diabetes. Diabetes is the most common cause of this condition. °· A nervous system disease, such as Parkinson disease or multiple sclerosis. °· Cancer, infection, or surgery of the stomach or vagus nerve. °· A connective tissue disorder, such as scleroderma. °· Certain medicines. ° °In most cases, the cause is not known. °What increases the risk? °This condition is more likely to develop in: °· People with certain disorders, including endocrine disorders, eating disorders, amyloidosis, and scleroderma. °· People with certain diseases, including Parkinson disease or multiple sclerosis. °· People with cancer or infection of the stomach or vagus nerve. °· People who have had surgery on the stomach or vagus nerve. °· People who take certain medicines. °· Women. ° °What are the signs or symptoms? °Symptoms of this condition include: °· An early feeling of fullness when eating. °· Nausea. °· Weight loss. °· Vomiting. °· Heartburn. °· Abdominal bloating. °· Inconsistent blood glucose levels. °· Lack of appetite. °· Acid from the stomach coming up into the esophagus (gastroesophageal reflux). °· Spasms of the stomach. ° °Symptoms may come and go. °How is this diagnosed? °This condition is diagnosed with tests, such as: °· Tests that check how long it takes food to move through the stomach and intestines. These tests include: °? Upper gastrointestinal (GI) series. In this test, X-rays of the intestines are taken after you drink a liquid. The liquid makes the intestines show up better on the X-rays. °? Gastric emptying scintigraphy. In this test, scans are taken after you eat food that contains a small amount of radioactive  material. °? Wireless capsule GI monitoring system. This test involves swallowing a capsule that records information about movement through the stomach. °· Gastric manometry. This test measures electrical and muscular activity in the stomach. It is done with a thin tube that is passed down the throat and into the stomach. °· Endoscopy. This test checks for abnormalities in the lining of the stomach. It is done with a long, thin tube that is passed down the throat and into the stomach. °· An ultrasound. This test can help rule out gallbladder disease or pancreatitis as a cause of your symptoms. It uses sound waves to take pictures of the inside of your body. ° °How is this treated? °There is no cure for gastroparesis. This condition may be managed with: °· Treatment of the underlying condition causing the gastroparesis. °· Lifestyle changes, including exercise and dietary changes. Dietary changes can include: °? Changes in what and when you eat. °? Eating smaller meals more often. °? Eating low-fat foods. °? Eating low-fiber forms of high-fiber foods, such as cooked vegetables instead of raw vegetables. °? Having liquid foods in place of solid foods. Liquid foods are easier to digest. °· Medicines. These may be given to control nausea and vomiting and to stimulate stomach muscles. °· Getting food through a feeding tube. This may be done in severe cases. °· A gastric neurostimulator. This is a device that is inserted into the body with surgery. It helps improve stomach emptying and control nausea and vomiting. ° °Follow these instructions at home: °· Follow your health care provider's instructions about exercise and diet. °· Take medicines only as directed by your health care provider. °Contact a   health care provider if: °· Your symptoms do not improve with treatment. °· You have new symptoms. °Get help right away if: °· You have severe abdominal pain that does not improve with treatment. °· You have nausea that does  not go away. °· You cannot keep fluids down. °This information is not intended to replace advice given to you by your health care provider. Make sure you discuss any questions you have with your health care provider. °Document Released: 10/23/2005 Document Revised: 03/30/2016 Document Reviewed: 10/19/2014 °Elsevier Interactive Patient Education © 2018 [employer] ° °

## 2017-06-11 NOTE — Progress Notes (Signed)
BP 118/73 (BP Location: Left Arm, Patient Position: Sitting, Cuff Size: Normal)   Pulse 75   Temp 98.3 F (36.8 C)   Ht 5' 2.7" (1.593 m)   Wt 131 lb 2 oz (59.5 kg)   SpO2 99%   BMI 23.45 kg/m    Subjective:    Patient ID: Brianna Leon, female    DOB: 1956/09/07, 61 y.o.   MRN: [id number]  HPI: Brianna Leon is a 61 y.o. female who presents today to establish care. Records from her previous provider from January of this year reviewed. Was on cymbalta, glimiperide, lantus, onglyza and simvastatin. Labs last checked in May.  Chief Complaint  Patient presents with  . Weight Loss    Patient was at around 185. Right abdominal pain that goes to her back, diarrhea. Patient states that she had the same symptoms as her and had her gallbladder removed and it the symptoms went away  . Joint Pain    fingers, knees and legs and back   ABDOMINAL PAIN- has had problems with pain in her epigastric region and into her R side for years. Has seen GI about it in the past. She notes that she has had tests done in the past, but not when it was acting up, but then it will come back again. She notes that it tends to happen with eating certain foods- especially greasy and spicy food, but happens with all foods. Salads really tend to make it worse.   Duration: 10+ years Onset: gradual Severity: mild discomfort to severe pain Quality: dull aching Location:  R side and RUQ  Episode duration: constant pain- gets worse with eating, will ease off  Radiation: yes- into her back and into her shoulder Frequency: constant, waxes and wanes Alleviating factors: nothing Aggravating factors: eating anything Status: fluctuating Treatments attempted: antacids, PPI and H2 Blocker- was told that she has had an ulcer in the past Fever: no Nausea: yes Vomiting: yes- at least a couple of times a week Weight loss: yes- over the last 3 years, without effort Decreased appetite: yes Diarrhea: yes Constipation:  no Blood in stool: no Heartburn: yes- indigestion- when that is happening Jaundice: no Rash: no Dysuria/urinary frequency: no Hematuria: no History of sexually transmitted disease: no Recurrent NSAID use: no- tylenol 2-3x a week  HYPERTENSION / HYPERLIPIDEMIA Satisfied with current treatment? yes Duration of hypertension: chronic BP monitoring frequency: not checking BP medication side effects: no Duration of hyperlipidemia: chronic Cholesterol medication side effects: no Cholesterol supplements: none Past cholesterol medications: simvastatin Medication compliance: excellent compliance Aspirin: yes Recent stressors: no Recurrent headaches: no Visual changes: no Palpitations: no Dyspnea: no Chest pain: no Lower extremity edema: no Dizzy/lightheaded: no  DIABETES Hypoglycemic episodes: yes- at night- lowest has been recently has been 60s Polydipsia/polyuria: no Visual disturbance: no Chest pain: no Paresthesias: no Glucose Monitoring: in the AM- has gotten up to 230 and as low as 140 Taking Insulin?: yes Blood Pressure Monitoring: not checking Retinal Examination: Up to Date Foot Exam: Up to Date Diabetic Education: Completed Pneumovax: Up to Date Influenza: Up to Date Aspirin: yes  Active Ambulatory Problems    Diagnosis Date Noted  . CAD (coronary artery disease)   . Hyperlipidemia   . Hypertension   . GERD (gastroesophageal reflux disease)   . DM (diabetes mellitus), type 2 ([place])   . RUQ pain 06/12/2017   Resolved Ambulatory Problems    Diagnosis Date Noted  . No Resolved Ambulatory Problems  Past Medical History:  Diagnosis Date  . CAD (coronary artery disease)   . DM (diabetes mellitus), type 2 ([place])   . Elevated liver enzymes   . Family history of colonic polyps   . GERD (gastroesophageal reflux disease)   . Hyperlipidemia   . IBS (irritable bowel syndrome)   . Migraine    Past Surgical History:  Procedure Laterality Date  . ABDOMINAL  HYSTERECTOMY  22 years ago  . CARDIAC CATHETERIZATION     x 2  . CARPAL TUNNEL RELEASE Right   . COLONOSCOPY  05/18/2015   repeat 5 years  . ESOPHAGOGASTRODUODENOSCOPY  05/18/2015  . KNEE ARTHROSCOPY  2001 and 2009  . TONSILLECTOMY     Outpatient Encounter Prescriptions as of 06/11/2017  Medication Sig  . glimepiride (AMARYL) 2 MG tablet Take 2 mg by mouth daily with breakfast.  . Insulin Glargine (LANTUS SOLOSTAR [place]) Inject into the skin. Inject 20 units into the skin every night  . multivitamin-lutein (OCUVITE-LUTEIN) CAPS capsule Take 1 capsule by mouth daily.  Marland Kitchen OVER THE COUNTER MEDICATION Hearing Support  . saxagliptin HCl (ONGLYZA) 5 MG TABS tablet Take 5 mg by mouth daily.  . simvastatin (ZOCOR) 40 MG tablet Take 40 mg by mouth daily.  . blood glucose meter kit and supplies KIT Dispense based on patient and insurance preference. Use up to four times daily as directed. (FOR ICD-9 250.00, 250.01).  . meclizine (ANTIVERT) 25 MG tablet Take 1 tablet (25 mg total) by mouth 3 (three) times daily as needed for dizziness.  . sucralfate (CARAFATE) 1 GM/10ML suspension Take 10 mLs (1 g total) by mouth 4 (four) times daily -  with meals and at bedtime.  . [DISCONTINUED] ondansetron (ZOFRAN ODT) 4 MG disintegrating tablet Take 1 tablet (4 mg total) by mouth every 8 (eight) hours as needed for nausea or vomiting.   No facility-administered encounter medications on file as of 06/11/2017.    Allergies  Allergen Reactions  . Atorvastatin Other (See Comments)  . Metformin And Related Diarrhea  . Other Hives    Lysol Cleaning Products  . Penicillins Nausea And Vomiting  . Tramadol Nausea Only   Family History  Problem Relation Age of Onset  . Colon polyps Father   . Diabetes Father   . Hypertension Father   . Heart disease Father   . Stroke Father   . Diabetes Mother   . Hypertension Mother   . Diabetes Brother   . Heart failure Brother        pacemaker  . Hypertension Brother   .  Heart attack Brother   . Migraines Daughter   . Lupus Son   . Diabetes Maternal Grandmother   . Heart disease Maternal Grandmother   . Stroke Maternal Grandfather   . Hypertension Paternal Grandmother   . Stroke Paternal Grandfather   . Hyperlipidemia Brother   . Breast cancer Neg Hx    Social History   Social History  . Marital status: Married    Spouse name: N/A  . Number of children: N/A  . Years of education: N/A   Occupational History  . Not on file.   Social History Main Topics  . Smoking status: Never Smoker  . Smokeless tobacco: Never Used  . Alcohol use No  . Drug use: No  . Sexual activity: Yes   Other Topics Concern  . Not on file   Social History Narrative  . No narrative on file   Review of Systems  Constitutional: Positive for appetite change, fatigue and unexpected weight change. Negative for activity change, chills, diaphoresis and fever.  HENT: Negative.   Respiratory: Negative.   Cardiovascular: Negative.   Gastrointestinal: Positive for abdominal pain and constipation. Negative for abdominal distention, anal bleeding, blood in stool, diarrhea, nausea, rectal pain and vomiting.  Musculoskeletal: Positive for arthralgias. Negative for back pain, gait problem, joint swelling, myalgias, neck pain and neck stiffness.  Neurological: Negative.   Psychiatric/Behavioral: Negative.     Per HPI unless specifically indicated above     Objective:    BP 118/73 (BP Location: Left Arm, Patient Position: Sitting, Cuff Size: Normal)   Pulse 75   Temp 98.3 F (36.8 C)   Ht 5' 2.7" (1.593 m)   Wt 131 lb 2 oz (59.5 kg)   SpO2 99%   BMI 23.45 kg/m   Wt Readings from Last 3 Encounters:  06/11/17 131 lb 2 oz (59.5 kg)  10/24/16 130 lb (59 kg)  04/06/15 153 lb (69.4 kg)    Physical Exam  Constitutional: She is oriented to person, place, and time. She appears well-developed and well-nourished. No distress.  HENT:  Head: Normocephalic and atraumatic.    Right Ear: Hearing normal.  Left Ear: Hearing normal.  Nose: Nose normal.  Eyes: Conjunctivae and lids are normal. Right eye exhibits no discharge. Left eye exhibits no discharge. No scleral icterus.  Cardiovascular: Normal rate, regular rhythm, normal heart sounds and intact distal pulses.  Exam reveals no gallop and no friction rub.   No murmur heard. Pulmonary/Chest: Effort normal and breath sounds normal. No respiratory distress. She has no wheezes. She has no rales. She exhibits no tenderness.  Abdominal: Soft. Bowel sounds are normal. She exhibits no distension and no mass. There is tenderness. There is no rebound and no guarding.  Musculoskeletal: Normal range of motion.  Tenderness on her R lower ribs- not particularly on her abdomen  Neurological: She is alert and oriented to person, place, and time.  Skin: Skin is warm, dry and intact. No rash noted. She is not diaphoretic. No erythema. No pallor.  Psychiatric: She has a normal mood and affect. Her speech is normal and behavior is normal. Judgment and thought content normal. Cognition and memory are normal.  Nursing note and vitals reviewed.   Results for orders placed or performed in visit on 06/11/17  Microscopic Examination  Result Value Ref Range   WBC, UA 0-5 0 - 5 /hpf   RBC, UA None seen 0 - 2 /hpf   Epithelial Cells (non renal) 0-10 0 - 10 /hpf   Bacteria, UA None seen None seen/Few  Bayer DCA Hb A1c Waived  Result Value Ref Range   Bayer DCA Hb A1c Waived 9.7 (H) <7.0 %  Microalbumin, urine  Result Value Ref Range   Microalb, Ur <7   Basic metabolic panel  Result Value Ref Range   Glucose 290    BUN 18 4 - 21   Creatinine 0.5 0.5 - 1.1   Potassium 4.9 3.4 - 5.3   Sodium 139 137 - 147  Lipid panel  Result Value Ref Range   Triglycerides 111 40 - 160   Cholesterol 216 (A) 0 - 200   HDL 45 35 - 70   LDL Cholesterol 149   Hepatic function panel  Result Value Ref Range   Alkaline Phosphatase 48 25 - 125    ALT 18 7 - 35   AST 14 13 - 35   Bilirubin,  Total 0.7   Hemoglobin A1c  Result Value Ref Range   Hemoglobin A1C 9.4   HM PAP SMEAR  Result Value Ref Range   HM Pap smear normal   UA/M w/rflx Culture, Routine  Result Value Ref Range   Specific Gravity, UA 1.025 1.005 - 1.030   pH, UA 6.0 5.0 - 7.5   Color, UA Yellow Yellow   Appearance Ur Clear Clear   Leukocytes, UA Negative Negative   Protein, UA Negative Negative/Trace   Glucose, UA Negative Negative   Ketones, UA Negative Negative   RBC, UA Negative Negative   Bilirubin, UA Negative Negative   Urobilinogen, Ur 1.0 0.2 - 1.0 mg/dL   Nitrite, UA Negative Negative   Microscopic Examination See below:       Assessment & Plan:   Problem List Items Addressed This Visit      Cardiovascular and Mediastinum   Hypertension - Primary    Under good control. Continue current regimen. Continue to monitor. Call with any concerns.       Relevant Medications   simvastatin (ZOCOR) 40 MG tablet     Digestive   GERD (gastroesophageal reflux disease)    Unclear if this is the cause of the weight loss- she thinks that she had an ulcer in the past. Will check H. Pylori and CMP. Will start carafate to see if this will help calm down her stomach. Await results.       Relevant Medications   sucralfate (CARAFATE) 1 GM/10ML suspension     Endocrine   DM (diabetes mellitus), type 2 ([place])    Uncontrolled with A1c of 9.4. Concern for possible gatroparesis with her issues with nausea and pain in her belly. Has not been taking her insulin. Strongly advised her to start taking it. Will continue to monitor. This may also be the cause of her weight loss.       Relevant Medications   simvastatin (ZOCOR) 40 MG tablet   glimepiride (AMARYL) 2 MG tablet   Insulin Glargine (LANTUS SOLOSTAR )   saxagliptin HCl (ONGLYZA) 5 MG TABS tablet   Other Relevant Orders   Bayer DCA Hb A1c Waived (Completed)     Other   Hyperlipidemia    Under good  control. Continue current regimen. Continue to monitor. Call with any concerns.       Relevant Medications   simvastatin (ZOCOR) 40 MG tablet   RUQ pain    Has had significant work up including HIDA without benefits. Patient is convinced that this is her gall bladder. Would like referral to general surgery for evaluation and discussion about CCY. Referral generated today. Long discussion with patient today as I am not sure this is her gall bladder. Her pain seems to be more boney and over her ribs than abdominal. She also also describes a lot of nausea and diarrhea with anything she eats. Pain is constant rather than colicky. Concern for possible gastroparesis with her uncontrolled diabetes or possibly a bone issue. Will check labs and x-ray. Will start carafate to see if it helps with the abdominal pain. Referral to surgery made today. Continue to monitor closely and await results.       Relevant Orders   CBC with Differential/Platelet   Comprehensive metabolic panel   UA/M w/rflx Culture, Routine (Completed)   Iron and TIBC   Ferritin   Thyroid Panel With TSH   H. pylori antibody, IgG   Ambulatory referral to General Surgery   DG Ribs Unilateral  Right (Completed)    Other Visit Diagnoses    Weight loss       Significant concern with loss of >50lbs over 2 years without effort. Checking labs today. Await results. Continue to follow closely.   Relevant Orders   CBC with Differential/Platelet   Comprehensive metabolic panel   UA/M w/rflx Culture, Routine (Completed)   Iron and TIBC   Ferritin   Thyroid Panel With TSH   Rib pain on right side       More rib pain than abdominal pain on exam- will check x-ray. Await results.    Relevant Orders   DG Ribs Unilateral Right (Completed)       Follow up plan: Return 3-4 weeks, for follow up.

## 2017-06-12 ENCOUNTER — Telehealth: Payer: Self-pay | Admitting: Family Medicine

## 2017-06-12 DIAGNOSIS — R1011 Right upper quadrant pain: Secondary | ICD-10-CM | POA: Insufficient documentation

## 2017-06-12 HISTORY — DX: Right upper quadrant pain: R10.11

## 2017-06-12 LAB — UA/M W/RFLX CULTURE, ROUTINE
Bilirubin, UA: NEGATIVE
Glucose, UA: NEGATIVE
Ketones, UA: NEGATIVE
Leukocytes, UA: NEGATIVE
Nitrite, UA: NEGATIVE
Protein, UA: NEGATIVE
RBC, UA: NEGATIVE
Specific Gravity, UA: 1.025 (ref 1.005–1.030)
Urobilinogen, Ur: 1 mg/dL (ref 0.2–1.0)
pH, UA: 6 (ref 5.0–7.5)

## 2017-06-12 LAB — IRON AND TIBC
Iron Saturation: 48 % (ref 15–55)
Iron: 141 ug/dL (ref 27–159)
Total Iron Binding Capacity: 292 ug/dL (ref 250–450)
UIBC: 151 ug/dL (ref 131–425)

## 2017-06-12 LAB — CBC WITH DIFFERENTIAL/PLATELET
Basophils Absolute: 0 10*3/uL (ref 0.0–0.2)
Basos: 0 %
EOS (ABSOLUTE): 0.2 10*3/uL (ref 0.0–0.4)
Eos: 3 %
Hematocrit: 41.6 % (ref 34.0–46.6)
Hemoglobin: 13.6 g/dL (ref 11.1–15.9)
Immature Grans (Abs): 0 10*3/uL (ref 0.0–0.1)
Immature Granulocytes: 0 %
Lymphocytes Absolute: 2.6 10*3/uL (ref 0.7–3.1)
Lymphs: 33 %
MCH: 30.8 pg (ref 26.6–33.0)
MCHC: 32.7 g/dL (ref 31.5–35.7)
MCV: 94 fL (ref 79–97)
Monocytes Absolute: 0.6 10*3/uL (ref 0.1–0.9)
Monocytes: 7 %
Neutrophils Absolute: 4.6 10*3/uL (ref 1.4–7.0)
Neutrophils: 57 %
Platelets: 253 10*3/uL (ref 150–379)
RBC: 4.41 x10E6/uL (ref 3.77–5.28)
RDW: 12.5 % (ref 12.3–15.4)
WBC: 8.1 10*3/uL (ref 3.4–10.8)

## 2017-06-12 LAB — THYROID PANEL WITH TSH
Free Thyroxine Index: 1.9 (ref 1.2–4.9)
T3 Uptake Ratio: 25 % (ref 24–39)
T4, Total: 7.4 ug/dL (ref 4.5–12.0)
TSH: 0.787 u[IU]/mL (ref 0.450–4.500)

## 2017-06-12 LAB — MICROSCOPIC EXAMINATION
Bacteria, UA: NONE SEEN
RBC, UA: NONE SEEN /hpf (ref 0–?)

## 2017-06-12 LAB — COMPREHENSIVE METABOLIC PANEL
ALT: 21 IU/L (ref 0–32)
AST: 19 IU/L (ref 0–40)
Albumin/Globulin Ratio: 2.1 (ref 1.2–2.2)
Albumin: 5 g/dL (ref 3.5–5.5)
Alkaline Phosphatase: 59 IU/L (ref 39–117)
BUN/Creatinine Ratio: 33 — ABNORMAL HIGH (ref 9–23)
BUN: 16 mg/dL (ref 6–24)
Bilirubin Total: 0.7 mg/dL (ref 0.0–1.2)
CO2: 25 mmol/L (ref 20–29)
Calcium: 10.2 mg/dL (ref 8.7–10.2)
Chloride: 101 mmol/L (ref 96–106)
Creatinine, Ser: 0.48 mg/dL — ABNORMAL LOW (ref 0.57–1.00)
GFR calc Af Amer: 124 mL/min/{1.73_m2} (ref >59)
GFR calc non Af Amer: 108 mL/min/{1.73_m2} (ref >59)
Globulin, Total: 2.4 g/dL (ref 1.5–4.5)
Glucose: 160 mg/dL — ABNORMAL HIGH (ref 65–99)
Potassium: 4.2 mmol/L (ref 3.5–5.2)
Sodium: 141 mmol/L (ref 134–144)
Total Protein: 7.4 g/dL (ref 6.0–8.5)

## 2017-06-12 LAB — H. PYLORI ANTIBODY, IGG: H. pylori, IgG AbS: <0.8 Index Value (ref 0.00–0.79)

## 2017-06-12 LAB — FERRITIN: Ferritin: 306 ng/mL — ABNORMAL HIGH (ref 15–150)

## 2017-06-12 NOTE — Assessment & Plan Note (Signed)
Uncontrolled with A1c of 9.4. Concern for possible gatroparesis with her issues with nausea and pain in her belly. Has not been taking her insulin. Strongly advised her to start taking it. Will continue to monitor. This may also be the cause of her weight loss.

## 2017-06-12 NOTE — Assessment & Plan Note (Signed)
Has had significant work up including HIDA without benefits. Patient is convinced that this is her gall bladder. Would like referral to general surgery for evaluation and discussion about CCY. Referral generated today. Long discussion with patient today as I am not sure this is her gall bladder. Her pain seems to be more boney and over her ribs than abdominal. She also also describes a lot of nausea and diarrhea with anything she eats. Pain is constant rather than colicky. Concern for possible gastroparesis with her uncontrolled diabetes or possibly a bone issue. Will check labs and x-ray. Will start carafate to see if it helps with the abdominal pain. Referral to surgery made today. Continue to monitor closely and await results.

## 2017-06-12 NOTE — Telephone Encounter (Signed)
Called patient, no answer, left message for patient to return my call.  

## 2017-06-12 NOTE — Assessment & Plan Note (Signed)
Unclear if this is the cause of the weight loss- she thinks that she had an ulcer in the past. Will check H. Pylori and CMP. Will start carafate to see if this will help calm down her stomach. Await results.

## 2017-06-12 NOTE — Assessment & Plan Note (Signed)
Under good control. Continue current regimen. Continue to monitor. Call with any concerns. 

## 2017-06-12 NOTE — Telephone Encounter (Signed)
Please let her know that her labs so far have been normal and her x-ray was normal. We're still waiting on a couple of labs to come back and I'll let her know when the come back.

## 2017-06-13 ENCOUNTER — Telehealth: Payer: Self-pay

## 2017-06-13 NOTE — Telephone Encounter (Signed)
Referral, OV notes, face sheet, and insurance cards printed and faxed to:   [facility] -[address] [place],  [place]  [id number] Demetrius [phone]: [phone] F: [phone])

## 2017-06-13 NOTE — Telephone Encounter (Signed)
Patient notified

## 2017-06-13 NOTE — Telephone Encounter (Signed)
Patient called back to speak with Tiffany. She can be reached at [phone]  Thank You

## 2017-06-18 NOTE — Progress Notes (Signed)
Lab copy given to Dr.Johnson

## 2017-06-20 ENCOUNTER — Telehealth: Payer: Self-pay | Admitting: Family Medicine

## 2017-06-20 NOTE — Telephone Encounter (Signed)
Phone call Discussed with patient side effects to Carafate. Patient will stay off medication try Prilosec times if getting worse will check back.

## 2017-06-20 NOTE — Telephone Encounter (Signed)
Patient was transferred to provider for telephone conversation.   

## 2017-06-20 NOTE — Telephone Encounter (Signed)
Call pt 

## 2017-06-20 NOTE — Telephone Encounter (Signed)
Called and spoke to the patient. She states that she has been taking the medication sucralfate since last week and not really had any issues with it. Patient states that last night, her throat was swelling and she had a rash all over her stomach. Patient states she tried taking a benadryl but threw it up. States she took another benadryl afterwards. Patient states she has not taken the medication today, throat is not swollen but a little scratchy and rash is pretty much gone but a little itching still. Patient would like to know what to do and feels that she should not take this medication. Patient requests to be called back at [phone]. I explained that Dr. Laural BenesJohnson was not here but that I would send this message to Dr. Dossie Arbourrissman to look at.

## 2017-06-20 NOTE — Telephone Encounter (Signed)
Patient called to see if someone would call her back because she had some type of reaction to the medication she has been taking for about a week now. She said yesterday was the first time she had that weird feeling.    She is hoping someone can call her back today.  Thank you  [phone] or [phone]

## 2017-06-26 ENCOUNTER — Encounter: Payer: Self-pay | Admitting: General Surgery

## 2017-06-26 ENCOUNTER — Ambulatory Visit (INDEPENDENT_AMBULATORY_CARE_PROVIDER_SITE_OTHER): Payer: BLUE CROSS/BLUE SHIELD | Admitting: General Surgery

## 2017-06-26 VITALS — BP 126/64 | HR 98 | Resp 12 | Ht 62.0 in | Wt 130.0 lb

## 2017-06-26 DIAGNOSIS — R112 Nausea with vomiting, unspecified: Secondary | ICD-10-CM

## 2017-06-26 DIAGNOSIS — R1011 Right upper quadrant pain: Secondary | ICD-10-CM

## 2017-06-26 NOTE — Progress Notes (Addendum)
Patient ID: Brianna Leon, female   DOB: 1956/02/10, 61 y.o.   MRN: [id number]  Chief Complaint  Patient presents with  . Abdominal Pain    HPI Brianna Leon is a 61 y.o. female.  here for evaluation of occasional right upper quadrant pain referred by Dr Park Liter. She states has noticed this for about 8-9 years. She states it has gotten worse over the 6 months. She notices this with heavy meals, greasy or spicy foods. The pain last 3-4 hours. She does get nauseated and vomit with every episode. She does admit to chills and tremors with the pain and nausea. The pain radiates to her back and shoulder. Occasional loose stools but not with every episode. She was placed on Carafate but had an allergic reaction. She states she has lost 70 pounds over 4 years. Her cardiologist is Dr Clayborn Bigness and is followed by [facility]. HIDA scan was 04-06-15. Abdominal ultrasound 07-09-14.  HPI  Past Medical History:  Diagnosis Date  . CAD (coronary artery disease)   . DM (diabetes mellitus), type 2 ([place]) 2000   insulin started 2016  . Elevated liver enzymes   . Family history of colonic polyps    father  . GERD (gastroesophageal reflux disease)   . Hyperlipidemia   . IBS (irritable bowel syndrome)   . Migraine     Past Surgical History:  Procedure Laterality Date  . ABDOMINAL HYSTERECTOMY  22 years ago  . CARDIAC CATHETERIZATION     x 2  . CARPAL TUNNEL RELEASE Right   . COLONOSCOPY  05/18/2015   repeat 5 years  . ESOPHAGOGASTRODUODENOSCOPY  05/18/2015  . KNEE ARTHROSCOPY  2001 and 2009  . TONSILLECTOMY      Family History  Problem Relation Age of Onset  . Colon polyps Father   . Diabetes Father   . Hypertension Father   . Heart disease Father   . Stroke Father   . Diabetes Mother   . Hypertension Mother   . Diabetes Brother   . Heart failure Brother        pacemaker  . Hypertension Brother   . Heart attack Brother   . Migraines Daughter   . Lupus Son   . Diabetes Maternal  Grandmother   . Heart disease Maternal Grandmother   . Stroke Maternal Grandfather   . Hypertension Paternal Grandmother   . Stroke Paternal Grandfather   . Hyperlipidemia Brother   . Breast cancer Neg Hx     Social History Social History  Substance Use Topics  . Smoking status: Never Smoker  . Smokeless tobacco: Never Used  . Alcohol use No    Allergies  Allergen Reactions  . Carafate [Sucralfate] Anaphylaxis and Hives  . Atorvastatin Other (See Comments)  . Metformin And Related Diarrhea  . Other Hives    Lysol Cleaning Products  . Penicillins Nausea And Vomiting  . Tramadol Nausea Only    Current Outpatient Prescriptions  Medication Sig Dispense Refill  . blood glucose meter kit and supplies KIT Dispense based on patient and insurance preference. Use up to four times daily as directed. (FOR ICD-9 250.00, 250.01). 1 each 0  . glimepiride (AMARYL) 2 MG tablet Take 2 mg by mouth daily with breakfast.    . Insulin Glargine (LANTUS SOLOSTAR [place]) Inject into the skin. Inject 20 units into the skin every night    . meclizine (ANTIVERT) 25 MG tablet Take 1 tablet (25 mg total) by mouth 3 (three) times daily  as needed for dizziness. 30 tablet 0  . multivitamin-lutein (OCUVITE-LUTEIN) CAPS capsule Take 1 capsule by mouth daily.    Marland Kitchen OVER THE COUNTER MEDICATION Hearing Support    . saxagliptin HCl (ONGLYZA) 5 MG TABS tablet Take 5 mg by mouth daily.    . simvastatin (ZOCOR) 40 MG tablet Take 40 mg by mouth daily.     No current facility-administered medications for this visit.     Review of Systems Review of Systems  Constitutional: Negative.   Respiratory: Negative.   Cardiovascular: Negative.   Gastrointestinal: Positive for abdominal pain, diarrhea, nausea and vomiting. Negative for constipation.    Blood pressure 126/64, pulse 98, resp. rate 12, height '5\' 2"'  (1.575 m), weight 130 lb (59 kg).  Weight 129 pounds during "Well woman" exam with Dr. Leonides Schanz on November 28, 2016.       Physical Exam Physical Exam  Constitutional: She is oriented to person, place, and time. She appears well-developed and well-nourished.  HENT:  Mouth/Throat: Oropharynx is clear and moist.  Eyes: Conjunctivae are normal. No scleral icterus.  Neck: Neck supple.  Cardiovascular: Normal rate, regular rhythm and normal heart sounds.   Pulmonary/Chest: Effort normal and breath sounds normal.  Abdominal: Soft. Bowel sounds are normal. There is no tenderness.  Lymphadenopathy:    She has no cervical adenopathy.  Neurological: She is alert and oriented to person, place, and time.  Skin: Skin is warm and dry.  Psychiatric: Her behavior is normal.    Data Reviewed GI notes of Mar 26, 2015: Evaluation for abdominal pain (RUQ) with nausea, vomiting.  Weight 157 poiunds.  EGD/Colonoscopy planned. Not completed at [facility].   2011 and 2013 endoscopy reports reviewed.   Abdominal ultrasound of July 09, 2014 and HIDA scan of Apr 06, 2015 reviewed.    HGB A1c of Apr 03, 2017:  9.4.   This was during transition from Dr. Ouida Sills to Dr. Wynetta Emery.  HGB A1c: 8.4 June 11, 2015. HGB A1c: 6.4 February 05, 2015.   CBC and comprehensive metabolic panel in ED October 24, 2016 notable for normal CBC, BS 358. Normal LFT's.   Cardiology evaluation of November 28, 2016. Chest pain at rest with no known cardiac etiology.  Assessment    Long standing abdominal pain (years).  Weight loss.  Conviction that resolution will occur with cholecystectomy as occurred with her daughter.    Plan    Urinary glucose losses during period of poor control could be contributing to weight loss. . Reviewed potential for improvement in symptoms depends on cause.  Risk of cholecystectomy including ductal injury reviewed (National data: 1/600).   With history of frequent stools, this can be worsened with gallbladder removal, with or without resolution of pain. History of diabetes for 18 years (only 2 years on insulin)  raises possibility of gastroparesis, especially considering the large volume of emesis she reports.  Will obtain gastric emptying study if biliary evaluation is negative.       Recommend repeating abdominal ultrasound with HIDA with stimulation to rule in/ rule out possible biliary source.    Patient 2016 HIDA showed EF of 91 %.  If pain has reproduction of symptoms with stimulation, there is a possibiity of symptom relief with cholecystectomy, although the literature is sparse to support.   More than 50% of the time was spent on chart/ imaging reviews.   HPI, Physical Exam, Assessment and Plan have been scribed under the direction and in the presence of Robert Bellow, MD.  Karie Fetch, RN  I have completed the exam and reviewed the above documentation for accuracy and completeness.  I agree with the above.  Haematologist has been used and any errors in dictation or transcription are unintentional.  Hervey Ard, M.D., F.A.C.S. [place] [phone] [birth year]

## 2017-06-26 NOTE — Patient Instructions (Addendum)
The patient is aware to call back for any questions or concerns.  Recommend repeating abdominal ultrasound and HIDA  The patient is scheduled for an abdominal Ultrasound and HIDA scan with CCK injection at [facility] on 07/11/17 at 8:30 am. She will arrive there by 8:15 am and have nothing to eat or drink after midnight the night prior. She is aware of date, time, and instructions.

## 2017-06-27 ENCOUNTER — Other Ambulatory Visit: Payer: Self-pay

## 2017-06-27 DIAGNOSIS — R112 Nausea with vomiting, unspecified: Secondary | ICD-10-CM

## 2017-06-27 DIAGNOSIS — R1011 Right upper quadrant pain: Secondary | ICD-10-CM

## 2017-07-06 ENCOUNTER — Encounter: Payer: Self-pay | Admitting: Family Medicine

## 2017-07-06 ENCOUNTER — Ambulatory Visit (INDEPENDENT_AMBULATORY_CARE_PROVIDER_SITE_OTHER): Payer: BLUE CROSS/BLUE SHIELD | Admitting: Family Medicine

## 2017-07-06 VITALS — BP 127/77 | HR 86 | Temp 97.7°F | Wt 128.4 lb

## 2017-07-06 DIAGNOSIS — R1011 Right upper quadrant pain: Secondary | ICD-10-CM

## 2017-07-06 MED ORDER — OMEPRAZOLE 20 MG PO CPDR: 20.0000 mg | DELAYED_RELEASE_CAPSULE | Freq: Every day | ORAL | 3 refills | Status: DC

## 2017-07-06 NOTE — Progress Notes (Signed)
BP 127/77 (BP Location: Left Arm, Patient Position: Sitting, Cuff Size: Normal)   Pulse 86   Temp 97.7 F (36.5 C)   Wt 128 lb 7 oz (58.3 kg)   SpO2 99%   BMI 23.49 kg/m    Subjective:    Patient ID: Brianna Leon, female    DOB: Feb 27, 1956, 61 y.o.   MRN: [id number]  HPI: Brianna Leon is a 61 y.o. female  Chief Complaint  Patient presents with  . Abdominal Pain   Abdominal pain- saw Dr. Lemar LivingsByrnett on 06/26/17. He was also concerned that this may be due to gastroparesis. He plans to obtain gastric emptying study if her billiary evaluation is negative. He is repeating her abdominal US and her HIDA to rule in or out a biliary source. She is scheduled to do this next week.   She did not tolerated her carafate- stated that it made her throat start to swell and gave her a rash on her belly. She stopped taking it and just continued on the prilosec. She has not started the prilosec. She notes that the carafate didn't really seem to help when she was taking it. She notes that she is looking forward to her testing next week and is really hoping that her tests show that this is her gall bladder so she has an answer. No other concerns or complaints at this time.   Relevant past medical, surgical, family and social history reviewed and updated as indicated. Interim medical history since our last visit reviewed. Allergies and medications reviewed and updated.  Review of Systems  Constitutional: Negative.   Respiratory: Negative.   Cardiovascular: Negative.   Gastrointestinal: Positive for abdominal pain, diarrhea, nausea and vomiting. Negative for abdominal distention, anal bleeding, blood in stool, constipation and rectal pain.  Psychiatric/Behavioral: Negative.     Per HPI unless specifically indicated above     Objective:    BP 127/77 (BP Location: Left Arm, Patient Position: Sitting, Cuff Size: Normal)   Pulse 86   Temp 97.7 F (36.5 C)   Wt 128 lb 7 oz (58.3 kg)   SpO2 99%    BMI 23.49 kg/m   Wt Readings from Last 3 Encounters:  07/06/17 128 lb 7 oz (58.3 kg)  06/26/17 130 lb (59 kg)  06/11/17 131 lb 2 oz (59.5 kg)    Physical Exam  Constitutional: She is oriented to person, place, and time. She appears well-developed and well-nourished. No distress.  HENT:  Head: Normocephalic and atraumatic.  Right Ear: Hearing normal.  Left Ear: Hearing normal.  Nose: Nose normal.  Eyes: Conjunctivae and lids are normal. Right eye exhibits no discharge. Left eye exhibits no discharge. No scleral icterus.  Cardiovascular: Normal rate, regular rhythm, normal heart sounds and intact distal pulses.  Exam reveals no gallop and no friction rub.   No murmur heard. Pulmonary/Chest: Effort normal and breath sounds normal. No respiratory distress. She has no wheezes. She has no rales. She exhibits no tenderness.  Abdominal: Soft. Bowel sounds are normal. She exhibits no distension and no mass. There is no tenderness. There is no rebound and no guarding.  Musculoskeletal: Normal range of motion.  Neurological: She is alert and oriented to person, place, and time.  Skin: Skin is warm, dry and intact. No rash noted. She is not diaphoretic. No erythema. No pallor.  Psychiatric: She has a normal mood and affect. Her speech is normal and behavior is normal. Judgment and thought content normal. Cognition and memory  are normal.  Nursing note and vitals reviewed.   Results for orders placed or performed in visit on 06/11/17  CBC with Differential/Platelet  Result Value Ref Range   WBC 8.1 3.4 - 10.8 x10E3/uL   RBC 4.41 3.77 - 5.28 x10E6/uL   Hemoglobin 13.6 11.1 - 15.9 g/dL   Hematocrit 16.1 09.6 - 46.6 %   MCV 94 79 - 97 fL   MCH 30.8 26.6 - 33.0 pg   MCHC 32.7 31.5 - 35.7 g/dL   RDW 04.5 40.9 - 81.1 %   Platelets 253 150 - 379 x10E3/uL   Neutrophils 57 Not Estab. %   Lymphs 33 Not Estab. %   Monocytes 7 Not Estab. %   Eos 3 Not Estab. %   Basos 0 Not Estab. %   Neutrophils  Absolute 4.6 1.4 - 7.0 x10E3/uL   Lymphocytes Absolute 2.6 0.7 - 3.1 x10E3/uL   Monocytes Absolute 0.6 0.1 - 0.9 x10E3/uL   EOS (ABSOLUTE) 0.2 0.0 - 0.4 x10E3/uL   Basophils Absolute 0.0 0.0 - 0.2 x10E3/uL   Immature Granulocytes 0 Not Estab. %   Immature Grans (Abs) 0.0 0.0 - 0.1 x10E3/uL  Comprehensive metabolic panel  Result Value Ref Range   Glucose 160 (H) 65 - 99 mg/dL   BUN 16 6 - 24 mg/dL   Creatinine, Ser 9.14 (L) 0.57 - 1.00 mg/dL   GFR calc non Af Amer 108 >59 mL/min/1.73   GFR calc Af Amer 124 >59 mL/min/1.73   BUN/Creatinine Ratio 33 (H) 9 - 23   Sodium 141 134 - 144 mmol/L   Potassium 4.2 3.5 - 5.2 mmol/L   Chloride 101 96 - 106 mmol/L   CO2 25 20 - 29 mmol/L   Calcium 10.2 8.7 - 10.2 mg/dL   Total Protein 7.4 6.0 - 8.5 g/dL   Albumin 5.0 3.5 - 5.5 g/dL   Globulin, Total 2.4 1.5 - 4.5 g/dL   Albumin/Globulin Ratio 2.1 1.2 - 2.2   Bilirubin Total 0.7 0.0 - 1.2 mg/dL   Alkaline Phosphatase 59 39 - 117 IU/L   AST 19 0 - 40 IU/L   ALT 21 0 - 32 IU/L  Iron and TIBC  Result Value Ref Range   Total Iron Binding Capacity 292 250 - 450 ug/dL   UIBC 782 956 - 213 ug/dL   Iron 086 27 - 578 ug/dL   Iron Saturation 48 15 - 55 %  Thyroid Panel With TSH  Result Value Ref Range   TSH 0.787 0.450 - 4.500 uIU/mL   T4, Total 7.4 4.5 - 12.0 ug/dL   T3 Uptake Ratio 25 24 - 39 %   Free Thyroxine Index 1.9 1.2 - 4.9  H. pylori antibody, IgG  Result Value Ref Range   H. pylori, IgG AbS <0.80 0.00 - 0.79 Index Value  Ferritin  Result Value Ref Range   Ferritin 306 (H) 15 - 150 ng/mL      Assessment & Plan:   Problem List Items Addressed This Visit      Other   RUQ pain - Primary    No change. Will switch to prilosec to see if this is GERD/gastritis related. Rx sent to her pharmacy. Continue to follow with general surgery. Call with any concerns.           Follow up plan: Return in about 2 months (around 09/05/2017) for DM visit.

## 2017-07-06 NOTE — Assessment & Plan Note (Signed)
No change. Will switch to prilosec to see if this is GERD/gastritis related. Rx sent to her pharmacy. Continue to follow with general surgery. Call with any concerns.

## 2017-07-11 ENCOUNTER — Encounter
Admission: RE | Admit: 2017-07-11 | Discharge: 2017-07-11 | Disposition: A | Discharge status: Home / Self Care | Payer: BLUE CROSS/BLUE SHIELD | Source: Ambulatory Visit | Attending: General Surgery | Admitting: General Surgery

## 2017-07-11 DIAGNOSIS — R1011 Right upper quadrant pain: Secondary | ICD-10-CM | POA: Insufficient documentation

## 2017-07-11 DIAGNOSIS — R112 Nausea with vomiting, unspecified: Secondary | ICD-10-CM

## 2017-07-11 MED ORDER — TECHNETIUM TC 99M MEBROFENIN IV KIT
5.0000 | PACK | Freq: Once | INTRAVENOUS | Status: AC | PRN
  Administered 2017-07-11: 5.1 via INTRAVENOUS

## 2017-07-11 MED ORDER — SINCALIDE 5 MCG IJ SOLR
0.0200 ug/kg | Freq: Once | INTRAMUSCULAR | Status: AC
  Administered 2017-07-11: 1.2 ug via INTRAVENOUS

## 2017-07-13 ENCOUNTER — Telehealth: Payer: Self-pay | Admitting: General Surgery

## 2017-07-13 ENCOUNTER — Telehealth: Payer: Self-pay | Admitting: Family Medicine

## 2017-07-13 NOTE — Telephone Encounter (Signed)
Routing to provider  

## 2017-07-13 NOTE — Telephone Encounter (Signed)
Patient would like for Dr Laural BenesJohnson to speak with her regarding the appointment with Dr Lemar LivingsByrnett for gallbladder issues.  She said she has some questions for Dr Laural BenesJohnson before she precedes on with his recommendations.    Thank Melissa NoonYou  Aide[place] [phone]

## 2017-07-13 NOTE — Telephone Encounter (Signed)
Called and spoke to Brianna Leon. 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 She notes that she doesn't want to pay a specialist copay to find out he doesn't want to take her gall bladder out. Discussed the results that I can see on epic and advised her that she should follow up with Dr. Lemar LivingsByrnett.   She notes that she tried to eat terribly before she had her HIDA scan to try to trigger her symptoms. She notes that she has been having diarrhea and has been feeling sick.   She will call Dr. Lemar LivingsByrnett to make an appointment to discuss.

## 2017-07-13 NOTE — Telephone Encounter (Signed)
Patient called & wanted her results from her abd u/s & hida scan.I spoke to Dr Lemar LivingsByrnett & HE SAID HE HAD SENT HER A MESSAGE IN MY CHART & IF SHE WANTED TO DISCUSS HER OPTIONS TO MAKE HER AN APPOINTMENT. I EXPLAINED THIS TO THE PATIENT & SHE WAS NOT HAPPY.SHE WAS GOING TO REVIEW HER MESSAGE & MAY CALL BACK FOR AN APPOINTMENT.

## 2017-08-02 ENCOUNTER — Encounter: Payer: Self-pay | Admitting: General Surgery

## 2017-08-02 ENCOUNTER — Ambulatory Visit (INDEPENDENT_AMBULATORY_CARE_PROVIDER_SITE_OTHER): Payer: BLUE CROSS/BLUE SHIELD | Admitting: General Surgery

## 2017-08-02 VITALS — BP 106/70 | HR 100 | Resp 14 | Ht 62.0 in | Wt 129.0 lb

## 2017-08-02 DIAGNOSIS — R1011 Right upper quadrant pain: Secondary | ICD-10-CM

## 2017-08-02 DIAGNOSIS — K8050 Calculus of bile duct without cholangitis or cholecystitis without obstruction: Secondary | ICD-10-CM

## 2017-08-02 HISTORY — DX: Calculus of bile duct without cholangitis or cholecystitis without obstruction: K80.50

## 2017-08-02 MED ORDER — DICYCLOMINE HCL 10 MG PO CAPS: 10.0000 mg | ORAL_CAPSULE | Freq: Three times a day (TID) | ORAL | 0 refills | Status: DC

## 2017-08-02 NOTE — Patient Instructions (Signed)

## 2017-08-02 NOTE — Progress Notes (Signed)
Patient ID: Sena Hitch, female   DOB: 1956-04-07, 61 y.o.   MRN: [id number]  Chief Complaint  Patient presents with  . Follow-up    HPI JENEAN ESCANDON is a 61 y.o. female here today to discuss her HIDA done on 07/11/2017. Patient states she had some pain darning her HIDA. Shortly after the scan the pain was a 9/10 reproducing her postprandial symptoms. Every time she eats she states she has pain.  HPI  Past Medical History:  Diagnosis Date  . CAD (coronary artery disease)   . DM (diabetes mellitus), type 2 ([place]) 2000   insulin started 2016  . Elevated liver enzymes   . Family history of colonic polyps    father  . GERD (gastroesophageal reflux disease)   . Hyperlipidemia   . IBS (irritable bowel syndrome)   . Migraine     Past Surgical History:  Procedure Laterality Date  . ABDOMINAL HYSTERECTOMY  22 years ago  . CARDIAC CATHETERIZATION     x 2  . CARPAL TUNNEL RELEASE Right   . COLONOSCOPY  05/18/2015   repeat 5 years  . ESOPHAGOGASTRODUODENOSCOPY  05/18/2015  . KNEE ARTHROSCOPY  2001 and 2009  . TONSILLECTOMY      Family History  Problem Relation Age of Onset  . Colon polyps Father   . Diabetes Father   . Hypertension Father   . Heart disease Father   . Stroke Father   . Diabetes Mother   . Hypertension Mother   . Diabetes Brother   . Heart failure Brother        pacemaker  . Hypertension Brother   . Heart attack Brother   . Migraines Daughter   . Lupus Son   . Diabetes Maternal Grandmother   . Heart disease Maternal Grandmother   . Stroke Maternal Grandfather   . Hypertension Paternal Grandmother   . Stroke Paternal Grandfather   . Hyperlipidemia Brother   . Breast cancer Neg Hx     Social History Social History  Substance Use Topics  . Smoking status: Never Smoker  . Smokeless tobacco: Never Used  . Alcohol use No    Allergies  Allergen Reactions  . Carafate [Sucralfate] Anaphylaxis and Hives  . Atorvastatin Other (See Comments)  .  Metformin And Related Diarrhea  . Other Hives    Lysol Cleaning Products  . Penicillins Nausea And Vomiting  . Tramadol Nausea Only    Current Outpatient Prescriptions  Medication Sig Dispense Refill  . blood glucose meter kit and supplies KIT Dispense based on patient and insurance preference. Use up to four times daily as directed. (FOR ICD-9 250.00, 250.01). 1 each 0  . glimepiride (AMARYL) 2 MG tablet Take 2 mg by mouth daily with breakfast.    . Insulin Glargine (LANTUS SOLOSTAR [place]) Inject into the skin. Inject 20 units into the skin every night    . meclizine (ANTIVERT) 25 MG tablet Take 1 tablet (25 mg total) by mouth 3 (three) times daily as needed for dizziness. 30 tablet 0  . multivitamin-lutein (OCUVITE-LUTEIN) CAPS capsule Take 1 capsule by mouth daily.    Marland Kitchen omeprazole (PRILOSEC) 20 MG capsule Take 1 capsule (20 mg total) by mouth daily. 30 capsule 3  . OVER THE COUNTER MEDICATION Hearing Support    . saxagliptin HCl (ONGLYZA) 5 MG TABS tablet Take 5 mg by mouth daily.    . simvastatin (ZOCOR) 40 MG tablet Take 40 mg by mouth daily.    Marland Kitchen dicyclomine (  BENTYL) 10 MG capsule Take 1 capsule (10 mg total) by mouth 4 (four) times daily -  before meals and at bedtime. 30 capsule 0   No current facility-administered medications for this visit.     Review of Systems Review of Systems  Blood pressure 106/70, pulse 100, resp. rate 14, height '5\' 2"'  (1.575 m), weight 129 lb (58.5 kg).  Physical Exam Physical Exam  Constitutional: She appears well-developed and well-nourished.  Cardiovascular: Normal rate and regular rhythm.   Pulmonary/Chest: Effort normal and breath sounds normal.    Data Reviewed HIDA scan of 07/11/2017 showed an ejection fraction of 98%.  Assessment    Biliary colic.  Plan    I reviewed with the patient at some patient's will have severe pain with a hyperactive gallbladder. At times this may be tempered with the use of a smooth muscle relaxant such as  Bentyl, other patients will have their gallbladder removed. The literature is inconsistent best regarding cholecystectomy for hyperfunctioning gallbladders, no mild experiences been one for those people with ejection fractions over 90% and with reproduction of symptoms they seem to have relief. A short trial of Bentyl will not be harmful and if she responds nicely to it, cholecystectomy could be avoided. We should know within a week whether this is the case.    The patient will notify the office after 1 week whether her episodes of daily pain of improve. If not we'll plan to proceed with cholecystectomy.   Based on her past history of cardiac disease and insulin-dependent diabetes, she will require cardiology assessment prior to surgery.  Laparoscopic Cholecystectomy with Intraoperative Cholangiogram. The procedure, including it's potential risks and complications (including but not limited to infection, bleeding, injury to intra-abdominal organs or bile ducts, bile leak, poor cosmetic result, sepsis and death) were discussed with the patient in detail. Non-operative options, including their inherent risks (acute calculous cholecystitis with possible choledocholithiasis or gallstone pancreatitis, with the risk of ascending cholangitis, sepsis, and death) were discussed as well. The patient expressed and understanding of what we discussed and wishes to proceed with laparoscopic cholecystectomy. The patient further understands that if it is technically not possible, or it is unsafe to proceed laparoscopically, that I will convert to an open cholecystectomy.  HPI, Physical Exam, Assessment and Plan have been scribed under the direction and in the presence of Hervey Ard, MD.  Gaspar Cola, CMA  I have completed the exam and reviewed the above documentation for accuracy and completeness.  I agree with the above.  Haematologist has been used and any errors in dictation or transcription are  unintentional.  Hervey Ard, M.D., F.A.C.S.   Robert Bellow 08/02/2017, 9:14 PM  Patient's surgery has been scheduled for 09-20-17 at [facility].   Dominga Ferry, CMA

## 2017-08-13 ENCOUNTER — Telehealth: Payer: Self-pay

## 2017-08-13 ENCOUNTER — Telehealth: Payer: Self-pay | Admitting: *Deleted

## 2017-08-13 NOTE — Telephone Encounter (Signed)
Patient called and states that she has used the medication as prescribed and has had no relief from her symptoms. She said that she will go ahead with her surgery planned for 09/20/17 to remove her gallbladder.

## 2017-08-13 NOTE — Telephone Encounter (Signed)
Patient was notified that she would need cardiac clearance prior to gallbladder surgery that is scheduled for 09-20-17.   An appointment has been scheduled with Dr. Juliann Pares for 08-15-17 at 4:15 pm.

## 2017-08-15 DIAGNOSIS — E7800 Pure hypercholesterolemia, unspecified: Secondary | ICD-10-CM | POA: Diagnosis not present

## 2017-08-15 DIAGNOSIS — Z8371 Family history of colonic polyps: Secondary | ICD-10-CM | POA: Diagnosis not present

## 2017-08-15 DIAGNOSIS — G43909 Migraine, unspecified, not intractable, without status migrainosus: Secondary | ICD-10-CM | POA: Diagnosis not present

## 2017-08-15 DIAGNOSIS — I2510 Atherosclerotic heart disease of native coronary artery without angina pectoris: Secondary | ICD-10-CM | POA: Diagnosis not present

## 2017-08-20 NOTE — Telephone Encounter (Signed)
Called Lisa at Dr. Glennis Brink office and have requested they fax clearance over since we have not received anything yet.

## 2017-08-22 NOTE — Telephone Encounter (Signed)
Cardiac [facility] [facility] pre-admit on 08-21-17.

## 2017-09-03 ENCOUNTER — Other Ambulatory Visit: Payer: Self-pay | Admitting: General Surgery

## 2017-09-03 DIAGNOSIS — K8050 Calculus of bile duct without cholangitis or cholecystitis without obstruction: Secondary | ICD-10-CM

## 2017-09-07 ENCOUNTER — Encounter: Payer: Self-pay | Admitting: Family Medicine

## 2017-09-07 ENCOUNTER — Ambulatory Visit (INDEPENDENT_AMBULATORY_CARE_PROVIDER_SITE_OTHER): Payer: BLUE CROSS/BLUE SHIELD | Admitting: Family Medicine

## 2017-09-07 VITALS — BP 135/79 | HR 83 | Temp 97.7°F | Wt 127.4 lb

## 2017-09-07 DIAGNOSIS — R300 Dysuria: Secondary | ICD-10-CM

## 2017-09-07 DIAGNOSIS — Z23 Encounter for immunization: Secondary | ICD-10-CM | POA: Diagnosis not present

## 2017-09-07 DIAGNOSIS — E118 Type 2 diabetes mellitus with unspecified complications: Secondary | ICD-10-CM | POA: Diagnosis not present

## 2017-09-07 NOTE — Assessment & Plan Note (Addendum)
Strongly advised patient to take medication daily. A1c not under good control at 10.4. Recheck in 3 months when her gall bladder has been removed. Call with any concerns.

## 2017-09-07 NOTE — Addendum Note (Signed)
Addended by: Verdon CumminsEEL, TIFFANY L on: 09/07/2017 05:06 PM   Modules accepted: Orders

## 2017-09-07 NOTE — Progress Notes (Signed)
BP 135/79 (BP Location: Left Arm, Patient Position: Sitting, Cuff Size: Normal)   Pulse 83   Temp 97.7 F (36.5 C)   Wt 127 lb 6 oz (57.8 kg)   SpO2 99%   BMI 23.30 kg/m    Subjective:    Patient ID: Brianna Leon, female    DOB: Mar 18, 1956, 61 y.o.   MRN: [id number]  HPI: Brianna Leon is a 60 y.o. female  Chief Complaint  Patient presents with  . Diabetes   Getting her gall bladder out on 11/15. Has bruised her ribs getting something out of the recycling bin.   DIABETES- has not been taking her medicine as prescribed because she is not eating as her belly is bothering her. She is taking her lantus about every other day, misses her lantus 2-3 days in a row some time Hypoglycemic episodes:yes- going down to 70 Polydipsia/polyuria: no Visual disturbance: no Chest pain: no Paresthesias: no Glucose Monitoring: yes  Accucheck frequency: Daily Taking Insulin?: yes Blood Pressure Monitoring: not checking Retinal Examination: Not up to Date Foot Exam: Up to Date Diabetic Education: Completed Pneumovax: Up to Date Influenza: Given today Aspirin: yes  URINARY SYMPTOMS Duration: Today Dysuria: yes Urinary frequency: yes Urgency: yes Small volume voids: no Symptom severity: moderate Urinary incontinence: no Foul odor: no Hematuria: no Abdominal pain: no Back pain: no Suprapubic pain/pressure: no Flank pain: no Fever:  no Vomiting: no Relief with cranberry juice: no Relief with pyridium: no Status: worse Previous urinary tract infection: yes Recurrent urinary tract infection: no   Relevant past medical, surgical, family and social history reviewed and updated as indicated. Interim medical history since our last visit reviewed. Allergies and medications reviewed and updated.  Review of Systems  Constitutional: Negative.   Respiratory: Negative.   Cardiovascular: Negative.   Gastrointestinal: Negative.   Genitourinary: Positive for dysuria and frequency.  Negative for decreased urine volume, difficulty urinating, dyspareunia, enuresis, flank pain, genital sores, hematuria, menstrual problem, pelvic pain, urgency, vaginal bleeding, vaginal discharge and vaginal pain.  Psychiatric/Behavioral: Negative.     Per HPI unless specifically indicated above     Objective:    BP 135/79 (BP Location: Left Arm, Patient Position: Sitting, Cuff Size: Normal)   Pulse 83   Temp 97.7 F (36.5 C)   Wt 127 lb 6 oz (57.8 kg)   SpO2 99%   BMI 23.30 kg/m   Wt Readings from Last 3 Encounters:  09/07/17 127 lb 6 oz (57.8 kg)  08/02/17 129 lb (58.5 kg)  07/06/17 128 lb 7 oz (58.3 kg)    Physical Exam  Constitutional: She is oriented to person, place, and time. She appears well-developed and well-nourished. No distress.  HENT:  Head: Normocephalic and atraumatic.  Right Ear: Hearing normal.  Left Ear: Hearing normal.  Nose: Nose normal.  Eyes: Conjunctivae and lids are normal. Right eye exhibits no discharge. Left eye exhibits no discharge. No scleral icterus.  Cardiovascular: Normal rate, regular rhythm, normal heart sounds and intact distal pulses.  Exam reveals no gallop and no friction rub.   No murmur heard. Pulmonary/Chest: Effort normal and breath sounds normal. No respiratory distress. She has no wheezes. She has no rales. She exhibits no tenderness.  Musculoskeletal: Normal range of motion.  Neurological: She is alert and oriented to person, place, and time.  Skin: Skin is warm, dry and intact. No rash noted. She is not diaphoretic. No erythema. No pallor.  Psychiatric: She has a normal mood and affect. Her  speech is normal and behavior is normal. Judgment and thought content normal. Cognition and memory are normal.  Nursing note and vitals reviewed.   Results for orders placed or performed in visit on 06/11/17  CBC with Differential/Platelet  Result Value Ref Range   WBC 8.1 3.4 - 10.8 x10E3/uL   RBC 4.41 3.77 - 5.28 x10E6/uL   Hemoglobin  13.6 11.1 - 15.9 g/dL   Hematocrit 29.541.6 62.134.0 - 46.6 %   MCV 94 79 - 97 fL   MCH 30.8 26.6 - 33.0 pg   MCHC 32.7 31.5 - 35.7 g/dL   RDW 30.812.5 65.712.3 - 84.615.4 %   Platelets 253 150 - 379 x10E3/uL   Neutrophils 57 Not Estab. %   Lymphs 33 Not Estab. %   Monocytes 7 Not Estab. %   Eos 3 Not Estab. %   Basos 0 Not Estab. %   Neutrophils Absolute 4.6 1.4 - 7.0 x10E3/uL   Lymphocytes Absolute 2.6 0.7 - 3.1 x10E3/uL   Monocytes Absolute 0.6 0.1 - 0.9 x10E3/uL   EOS (ABSOLUTE) 0.2 0.0 - 0.4 x10E3/uL   Basophils Absolute 0.0 0.0 - 0.2 x10E3/uL   Immature Granulocytes 0 Not Estab. %   Immature Grans (Abs) 0.0 0.0 - 0.1 x10E3/uL  Comprehensive metabolic panel  Result Value Ref Range   Glucose 160 (H) 65 - 99 mg/dL   BUN 16 6 - 24 mg/dL   Creatinine, Ser 9.620.48 (L) 0.57 - 1.00 mg/dL   GFR calc non Af Amer 108 >59 mL/min/1.73   GFR calc Af Amer 124 >59 mL/min/1.73   BUN/Creatinine Ratio 33 (H) 9 - 23   Sodium 141 134 - 144 mmol/L   Potassium 4.2 3.5 - 5.2 mmol/L   Chloride 101 96 - 106 mmol/L   CO2 25 20 - 29 mmol/L   Calcium 10.2 8.7 - 10.2 mg/dL   Total Protein 7.4 6.0 - 8.5 g/dL   Albumin 5.0 3.5 - 5.5 g/dL   Globulin, Total 2.4 1.5 - 4.5 g/dL   Albumin/Globulin Ratio 2.1 1.2 - 2.2   Bilirubin Total 0.7 0.0 - 1.2 mg/dL   Alkaline Phosphatase 59 39 - 117 IU/L   AST 19 0 - 40 IU/L   ALT 21 0 - 32 IU/L  Iron and TIBC  Result Value Ref Range   Total Iron Binding Capacity 292 250 - 450 ug/dL   UIBC 952151 841131 - 324425 ug/dL   Iron 401141 27 - 027159 ug/dL   Iron Saturation 48 15 - 55 %  Thyroid Panel With TSH  Result Value Ref Range   TSH 0.787 0.450 - 4.500 uIU/mL   T4, Total 7.4 4.5 - 12.0 ug/dL   T3 Uptake Ratio 25 24 - 39 %   Free Thyroxine Index 1.9 1.2 - 4.9  H. pylori antibody, IgG  Result Value Ref Range   H. pylori, IgG AbS <0.80 0.00 - 0.79 Index Value  Ferritin  Result Value Ref Range   Ferritin 306 (H) 15 - 150 ng/mL      Assessment & Plan:   Problem List Items Addressed This  Visit      Endocrine   DM (diabetes mellitus), type 2 ([facility]) - Primary    Strongly advised patient to take medication daily. A1c not under good control at 10.4. Recheck in 3 months when her gall bladder has been removed. Call with any concerns.       Relevant Orders   Bayer DCA Hb A1c Waived    Other  Visit Diagnoses    Dysuria       Urine checked today, sent out. Await results.    Relevant Orders   UA/M w/rflx Culture, Routine       Follow up plan: Return in about 3 months (around 12/08/2017) for DM, cholesterol, BP follow up.

## 2017-09-08 LAB — UA/M W/RFLX CULTURE, ROUTINE
Bilirubin, UA: NEGATIVE
Ketones, UA: NEGATIVE
Leukocytes, UA: NEGATIVE
Nitrite, UA: NEGATIVE
Protein, UA: NEGATIVE
RBC, UA: NEGATIVE
Specific Gravity, UA: >=1.03 — AB (ref 1.005–1.030)
Urobilinogen, Ur: 0.2 mg/dL (ref 0.2–1.0)
pH, UA: 5 (ref 5.0–7.5)

## 2017-09-08 LAB — MICROSCOPIC EXAMINATION: Casts: NONE SEEN /lpf

## 2017-09-08 LAB — BAYER DCA HB A1C WAIVED: HB A1C (BAYER DCA - WAIVED): 10.4 % — ABNORMAL HIGH (ref <7.0)

## 2017-09-11 ENCOUNTER — Encounter
Admission: RE | Admit: 2017-09-11 | Discharge: 2017-09-11 | Disposition: A | Discharge status: Home / Self Care | Payer: BLUE CROSS/BLUE SHIELD | Source: Ambulatory Visit | Attending: General Surgery | Admitting: General Surgery

## 2017-09-11 HISTORY — DX: Unspecified osteoarthritis, unspecified site: M19.90

## 2017-09-11 HISTORY — DX: Hemorrhage, not elsewhere classified: R58

## 2017-09-11 NOTE — Patient Instructions (Addendum)
Your procedure is scheduled on: 09-20-17 THURSDAY Report to Same Day Surgery 2nd floor medical mall [facility](Medical Mall Entrance-take elevator on left to 2nd floor.  Check in with surgery information desk.) To find out your arrival time please call [phone] between 1PM - 3PM on [facility]  Remember: Instructions that are not followed completely may result in serious medical risk, up to and including death, or upon the discretion of your surgeon and anesthesiologist your surgery may need to be rescheduled.    _x___ 1. Do not eat food after midnight the night before your procedure. NO GUM CHEWING OR CANDY AFTER MIDNIGHT.  You may drink WATER up to 2 hours before you are scheduled to arrive at the hospital for your procedure.  Do not drink clear liquids within 2 hours of your scheduled arrival to the hospital.  Type 1 and type 2 diabetics should only drink water.     __x__ 2. No Alcohol for 24 hours before or after surgery.   __x__3. No Smoking for 24 prior to surgery.   ____  4. Bring all medications with you on the day of surgery if instructed.    __x__ 5. Notify your doctor if there is any change in your medical condition     (cold, fever, infections).     Do not wear jewelry, make-up, hairpins, clips or nail polish.  Do not wear lotions, powders, or perfumes. You may wear deodorant.  Do not shave 48 hours prior to surgery. Men may shave face and neck.  Do not bring valuables to the hospital.    [facility] any belongings or valuables.               Contacts, dentures or bridgework may not be worn into surgery.  Leave your suitcase in the car. After surgery it may be brought to your room.  For patients admitted to the hospital, discharge time is determined by your treatment team.   Patients discharged the day of surgery will not be allowed to drive home.  You will need someone to drive you home and stay with you the night of your procedure.    Please  read over the following fact sheets that you were given:   [facility] Preparing for [facility] and or MRSA Information   _x___ TAKE THE FOLLOWING MEDICATIONS THE MORNING OF SURGERY WITH A SMALL SIP OF WATER. These include:  1. PRILOSEC  2. TAKE A PRILOSEC Wednesday NIGHT BEFORE BED  3.  4.  5.  6.  ____Fleets enema or Magnesium Citrate as directed.   _x___ Use CHG Soap or sage wipes as directed on instruction sheet   ____ Use inhalers on the day of surgery and bring to hospital day of surgery  ____ Stop Metformin and Janumet 2 days prior to surgery.    _X___ Take 1/2 of usual insulin dose the night before surgery and none on the morning surgery-TAKE 10 UNITS OF LANTUS ON Wednesday NIGHT AND NO INSULIN THE AM OF SURGERY  ____ Follow recommendations from Cardiologist, Pulmonologist or PCP regarding stopping Aspirin, Coumadin, Plavix ,Eliquis, Effient, or Pradaxa, and Pletal-OK TO CONTINUE 81 MG ASPIRIN-DO NOT TAKE AM OF SURGERY  ____Stop Anti-inflammatories such as Advil, Aleve, Ibuprofen, Motrin, Naproxen, Naprosyn, Goodies powders or aspirin products. OK to take Tylenol-ASK DR BYRNETT ABOUT BAYER BACK AND BODY AND EXCEDRIN SINCE YOUR TAKING AN 81 MG ASPIRIN.   _x___ Stop supplements until after surgery-STOP HEARING HEALTH SUPPLEMENT NOW-MAY RESUME AFTER  SURGERY   ____ Bring C-Pap to the hospital.

## 2017-09-11 NOTE — Pre-Procedure Instructions (Signed)
Brianna Leon, [name], MD - 08/15/2017 4:15 PM EDT Formatting of this note might be different from the original. Established Patient Visit   Chief Complaint: Chief Complaint  Patient presents with  . Follow-up  P.O.C. GALLBLADDER  Date of Service: 08/15/2017 Date of Birth: 1956-03-08 PCP: Dorcas CarrowJohnson, Megan P, DO  History of Present Illness: Ms. Brianna Leon is a 61 y.o.female patient who presents for preoperative clearance for gallbladder surgery which is scheduled November 15 with Dr. Doristine CounterBurnett. Patient reportedly had a HIDA scan which was abnormal she is been having recurrent symptoms of abdominal discomfort now thought to be related to gallbladder. Patient denies any significant cardiac issues has had noninvasive cardiac workup in 2015 which was unremarkable she has history of diabetes and hypertension but feels reasonably well. Patient is at home taking care of grandkids right now.  Past Medical and Surgical History  Past Medical History Past Medical History:  Diagnosis Date  . CAD (coronary artery disease)  . DM2 (diabetes mellitus, type 2) (CMS-HCC)  PCP Dr. Einar CrowMarshall Anderson  . Elevated liver enzymes 03/26/2015  . GERD (gastroesophageal reflux disease)  . HLD (hyperlipidemia), unspecified  . HTN (hypertension)  . Onychia and paronychia of toe  . Osteoarthrosis, unspecified whether generalized or localized, lower leg  . Pure hypercholesterolemia   Past Surgical History She has a past surgical history that includes Hysterectomy; Wisdom Teeth Removed; Endoscopic Carpal Tunnel Release (Right); Cardiac catheterization; Tonsillectomy; Cardiac catheterization; Colonoscopy (03/28/2010, 10/23/2003, 03/26/1998); egd (03/26/1998); egd (08/09/2012); Colonoscopy (05/18/2015); egd (05/18/2015); and Knee arthroscopy (Bilateral).   Medications and Allergies  Current Medications  Current Outpatient Medications  Medication Sig Dispense Refill  . ALPRAZolam (XANAX) 0.25 MG tablet Take 0.25 mg by mouth  nightly as needed for Sleep.  Marland Kitchen. aspirin 81 MG EC tablet Take 81 mg by mouth once daily.  . blood glucose diagnostic test strip Use 3 (three) times daily. Use as instructed. On insulin and glucose is high 100 each 12  . Compound Medication Estradiol 0.2 mg/gm. 0.5 gm 1x a week. Or as directed. Qty:30gm refills 3 1 each 0  . DULoxetine (CYMBALTA) 60 MG DR capsule Take 1 capsule (60 mg total) by mouth once daily. 90 capsule 3  . glimepiride (AMARYL) 2 MG tablet Take 1 tablet (2 mg total) by mouth daily with breakfast. 90 tablet 3  . insulin GLARGINE (LANTUS SOLOSTAR, BASAGLAR KWIKPEN) pen injector (concentration 100 units/mL) Inject 20 Units subcutaneously nightly. 18 mL 3  . sAXagliptin (ONGLYZA) 5 mg tablet Take 1 tablet (5 mg total) by mouth once daily. 90 tablet 3  . simvastatin (ZOCOR) 40 MG tablet Take 1 tablet (40 mg total) by mouth nightly. 90 tablet 3  . VIT A/VIT C/VIT E/ZINC/COPPER (PRESERVISION AREDS ORAL) Take by mouth.   No current facility-administered medications for this visit.   Allergies: Carafate [sucralfate]; Lipitor [atorvastatin]; Metformin; Penicillins; and Tramadol  Social and Family History  Social History reports that she has never smoked. She has never used smokeless tobacco. She reports that she does not drink alcohol or use drugs.  Family History Family History  Problem Relation Age of Onset  . Myocardial Infarction (Heart attack) Brother  . Diabetes type II Brother  . High blood pressure (Hypertension) Brother  . Pacemaker Brother  . Heart failure Brother  . Diabetes Unknown  . Diabetes type II Mother  . Diabetes type II Father  . High blood pressure (Hypertension) Father   Review of Systems   Review of Systems: The patient denies chest pain, shortness  of breath, orthopnea, paroxysmal nocturnal dyspnea, pedal edema, palpitations, heart racing, presyncope, syncope. Review of 12 Systems is negative except as described above.  Physical Examination    Vitals:There were no vitals taken for this visit. Ht: Wt: ZOX:WRUEABSA:There is no height or weight on file to calculate BSA. There is no height or weight on file to calculate BMI.  HEENT: Pupils equally reactive to light and accomodation  Neck: Supple without thyromegaly, carotid pulses 2+ Lungs: clear to auscultation bilaterally; no wheezes, rales, rhonchi Heart: Regular rate and rhythm. No gallops, murmurs or rub Abdomen: soft nontender, nondistended, with normal bowel sounds Extremities: no cyanosis, clubbing, or edema Peripheral Pulses: 2+ in all extremities, 2+ femoral pulses bilaterally  Assessment   61 y.o. female with  1. Migraine without status migrainosus, not intractable, unspecified migraine type  2. Coronary artery disease involving native coronary artery of native heart without angina pectoris  3. Pure hypercholesterolemia  4. Family history of polyps in the colon  5. Abdominal pain, RUQ (right upper quadrant)  6. Hx of nausea  7. Elevated liver enzymes  8. Chest tightness  9. Type 2 diabetes mellitus without complication, with long-term current use of insulin (CMS-HCC)  10. Gastroesophageal reflux disease, esophagitis presence not specified   Plan  1 preop clearance for gallbladder surgery patient appears to be an acceptable risk 2 coronary artery disease reasonably stable continue aspirin continue simvastatin 3 hyperlipidemia chronic stable continue simvastatin therapy 4 diabetes type 2 uncomplicated continue glimepiride and Onglyza 5 GERD chronic current stable consider Zantac omeprazole therapy for reflux symptoms 6 anxiety chronic stable continue Xanax therapy 7 have the patient follow-up in 1 year  Return in about 1 year (around 08/15/2018).  Alwyn PeaWAYNE D CALLWOOD, MD    Electronically Signed by Brianna Leon, [name], MD on 08/15/2017 4:15 PM EDT     Plan of Treatment - as of this encounter  Upcoming Encounters Upcoming Encounters  Date Type Specialty  Care Team Description  11/29/2017 Office Visit Obstetrics and Gynecology Ward, Renee Ramushelsea Crist, MD  [address]  [facility]  [place], [place] [address]  [phone]  [phone] (Fax)     Visit Diagnoses - in this encounter  Diagnosis  Migraine without status migrainosus, not intractable, unspecified migraine type - Primary   Coronary artery disease involving native coronary artery of native heart without angina pectoris   Pure hypercholesterolemia   Family history of polyps in the colon  Family history of colonic polyps   Abdominal pain, RUQ (right upper quadrant)  Abdominal pain, right upper quadrant   Hx of nausea   Elevated liver enzymes  Other nonspecific abnormal serum enzyme levels   Chest tightness  Other chest pain   Type 2 diabetes mellitus without complication, with long-term current use of insulin (CMS-HCC)   Gastroesophageal reflux disease, esophagitis presence not specified    Images Document Information  Primary Care Provider Other Service Providers Document Coverage Dates  Dorcas CarrowMegan P Johnson DO (Oct. 10, 2018 - Present) [phone] (Work) [phone] (Fax) [address] [place], [place] [address]   Oct. 10, 2018   Custodian Organization  [facility] [address] [place], [place] [address]   Encounter Providers Encounter Date  Dwayne Dorthey Sawyerennis Callwood MD (Attending) [phone] (Work) [phone] (Fax) [address] [facility] [place], [place] [address]  Oct. 10, 2018

## 2017-09-12 ENCOUNTER — Telehealth: Payer: Self-pay | Admitting: Family Medicine

## 2017-09-12 NOTE — Telephone Encounter (Signed)
Patient notified

## 2017-09-12 NOTE — Telephone Encounter (Signed)
Please let her know that her urine was normal. I sent it through mychart, so I'm sorry she didn't get it.

## 2017-09-12 NOTE — Telephone Encounter (Signed)
Copied from CRM [id number]. Topic: General - Other >> Sep 12, 2017  9:37 AM Jolayne Hainesaylor, Brittany L wrote: Reason for CRM:  Patient is requesting her results from Monday. She said the DR was suppose to call her on Monday and she has not heard anything

## 2017-09-14 ENCOUNTER — Encounter
Admission: RE | Admit: 2017-09-14 | Discharge: 2017-09-14 | Disposition: A | Discharge status: Home / Self Care | Payer: BLUE CROSS/BLUE SHIELD | Source: Ambulatory Visit | Attending: General Surgery | Admitting: General Surgery

## 2017-09-14 DIAGNOSIS — E119 Type 2 diabetes mellitus without complications: Secondary | ICD-10-CM | POA: Diagnosis not present

## 2017-09-14 DIAGNOSIS — R001 Bradycardia, unspecified: Secondary | ICD-10-CM | POA: Diagnosis not present

## 2017-09-14 DIAGNOSIS — K8050 Calculus of bile duct without cholangitis or cholecystitis without obstruction: Secondary | ICD-10-CM | POA: Diagnosis not present

## 2017-09-14 NOTE — Pre-Procedure Instructions (Signed)
Brianna Leon, Brianna P, DO  Physician  Family Medicine      Progress Notes  Signed     Encounter Date:  09/07/2017                 Signed                     Expand widget buttonCollapse widget button     Hide copied text   Hover for detailscustomization button                                                                                 untitled image     BP 135/79 (BP Location: Left Arm, Patient Position: Sitting, Cuff Size: Normal)   Pulse 83   Temp 97.7 F (36.5 C)   Wt 127 lb 6 oz (57.8 kg)   SpO2 99%   BMI 23.30 kg/m        Subjective:        Patient ID: Brianna Leon, female    DOB: 1956/07/17, 61 y.o.   MRN: [id number]     HPI:  Brianna Leon is a 61 y.o. female         Chief Complaint    Patient presents with    .   Diabetes       Getting her gall bladder out on 11/15. Has bruised her ribs getting something out of the recycling bin.      DIABETES- has not been taking her medicine as prescribed because she is not eating as her belly is bothering her. She is taking her lantus about every other day, misses her lantus 2-3 days in a row some time  Hypoglycemic episodes:yes- going down to 70  Polydipsia/polyuria: no  Visual disturbance: no  Chest pain: no  Paresthesias: no  Glucose Monitoring: yes              Accucheck frequency: Daily  Taking Insulin?: yes  Blood Pressure Monitoring: not checking  Retinal Examination: Not up to Date  Foot Exam: Up to Date  Diabetic Education: Completed  Pneumovax: Up to Date  Influenza: Given today  Aspirin: yes     URINARY SYMPTOMS  Duration: Today  Dysuria: yes  Urinary frequency: yes  Urgency: yes  Small volume voids: no  Symptom severity: moderate  Urinary incontinence: no  Foul odor: no  Hematuria: no  Abdominal pain: no  Back pain:  no  Suprapubic pain/pressure: no  Flank pain: no  Fever:  no  Vomiting: no  Relief with cranberry juice: no  Relief with pyridium: no  Status: worse  Previous urinary tract infection: yes  Recurrent urinary tract infection: no        Relevant past medical, surgical, family and social history reviewed and updated as indicated. Interim medical history since our last visit reviewed.  Allergies and medications reviewed and updated.     Review of Systems   Constitutional: Negative.    Respiratory: Negative.    Cardiovascular: Negative.    Gastrointestinal: Negative.    Genitourinary: Positive for dysuria and frequency. Negative for decreased urine volume, difficulty urinating, dyspareunia, enuresis, flank pain, genital sores, hematuria,  menstrual problem, pelvic pain, urgency, vaginal bleeding, vaginal discharge and vaginal pain.   Psychiatric/Behavioral: Negative.          Per HPI unless specifically indicated above          Objective:      BP 135/79 (BP Location: Left Arm, Patient Position: Sitting, Cuff Size: Normal)   Pulse 83   Temp 97.7 F (36.5 C)   Wt 127 lb 6 oz (57.8 kg)   SpO2 99%   BMI 23.30 kg/m        Wt Readings from Last 3 Encounters:    09/07/17   127 lb 6 oz (57.8 kg)    08/02/17   129 lb (58.5 kg)    07/06/17   128 lb 7 oz (58.3 kg)       Physical Exam   Constitutional: She is oriented to person, place, and time. She appears well-developed and well-nourished. No distress.   HENT:   Head: Normocephalic and atraumatic.  Right Ear: Hearing normal.  Left Ear: Hearing normal.   Nose: Nose normal.   Eyes: Conjunctivae and lids are normal. Right eye exhibits no discharge. Left eye exhibits no discharge. No scleral icterus.   Cardiovascular: Normal rate, regular rhythm, normal heart sounds and intact distal pulses.  Exam reveals no gallop and no friction rub.    No murmur heard.  Pulmonary/Chest: Effort  normal and breath sounds normal. No respiratory distress. She has no wheezes. She has no rales. She exhibits no tenderness.  Musculoskeletal: Normal range of motion.  Neurological: She is alert and oriented to person, place, and time.   Skin: Skin is warm, dry and intact. No rash noted. She is not diaphoretic. No erythema. No pallor.  Psychiatric: She has a normal mood and affect. Her speech is normal and behavior is normal. Judgment and thought content normal. Cognition and memory are normal.   Nursing note and vitals reviewed.              Results for orders placed or performed in visit on 06/11/17    CBC with Differential/Platelet    Result   Value   Ref Range        WBC   8.1   3.4 - 10.8 x10E3/uL        RBC   4.41   3.77 - 5.28 x10E6/uL        Hemoglobin   13.6   11.1 - 15.9 g/dL        Hematocrit   40.941.6   34.0 - 46.6 %        MCV   94   79 - 97 fL        MCH   30.8   26.6 - 33.0 pg        MCHC   32.7   31.5 - 35.7 g/dL        RDW   81.112.5   91.412.3 - 15.4 %        Platelets   253   150 - 379 x10E3/uL        Neutrophils   57   Not Estab. %        Lymphs   33   Not Estab. %        Monocytes   7   Not Estab. %        Eos   3   Not Estab. %        Basos   0  Not Estab. %        Neutrophils Absolute   4.6   1.4 - 7.0 x10E3/uL        Lymphocytes Absolute   2.6   0.7 - 3.1 x10E3/uL        Monocytes Absolute   0.6   0.1 - 0.9 x10E3/uL        EOS (ABSOLUTE)   0.2   0.0 - 0.4 x10E3/uL        Basophils Absolute   0.0   0.0 - 0.2 x10E3/uL        Immature Granulocytes   0   Not Estab. %        Immature Grans (Abs)   0.0   0.0 - 0.1 x10E3/uL    Comprehensive metabolic panel    Result   Value   Ref Range        Glucose   160 (H)   65 - 99 mg/dL        BUN   16   6 - 24 mg/dL         Creatinine, Ser   0.48 (L)   0.57 - 1.00 mg/dL        GFR calc non Af Amer   108   >59 mL/min/1.73        GFR calc Af Amer   124   >59 mL/min/1.73        BUN/Creatinine Ratio   33 (H)   9 - 23        Sodium   141   134 - 144 mmol/L        Potassium   4.2   3.5 - 5.2 mmol/L        Chloride   101   96 - 106 mmol/L        CO2   25   20 - 29 mmol/L        Calcium   10.2   8.7 - 10.2 mg/dL        Total Protein   7.4   6.0 - 8.5 g/dL        Albumin   5.0   3.5 - 5.5 g/dL        Globulin, Total   2.4   1.5 - 4.5 g/dL        Albumin/Globulin Ratio   2.1   1.2 - 2.2        Bilirubin Total   0.7   0.0 - 1.2 mg/dL        Alkaline Phosphatase   59   39 - 117 IU/L        AST   19   0 - 40 IU/L        ALT   21   0 - 32 IU/L    Iron and TIBC    Result   Value   Ref Range        Total Iron Binding Capacity   292   250 - 450 ug/dL        UIBC   161   096 - 425 ug/dL        Iron   045   27 - 159 ug/dL        Iron Saturation   48   15 - 55 %    Thyroid Panel With TSH    Result   Value   Ref Range        TSH   0.787  0.450 - 4.500 uIU/mL        T4, Total   7.4   4.5 - 12.0 ug/dL        T3 Uptake Ratio   25   24 - 39 %        Free Thyroxine Index   1.9   1.2 - 4.9    H. pylori antibody, IgG    Result   Value   Ref Range        H. pylori, IgG AbS   <0.80   0.00 - 0.79 Index Value    Ferritin    Result   Value   Ref Range        Ferritin   306 (H)   15 - 150 ng/mL            Assessment & Plan:           Problem List Items Addressed This Visit                  Endocrine        DM (diabetes mellitus), type 2 (HCC) - Primary              Strongly advised patient to take medication daily. A1c not under good control at 10.4.  Recheck in 3 months when her gall bladder has been removed. Call with any concerns.               Relevant Orders        Bayer DCA Hb A1c Waived                 Other Visit Diagnoses          Dysuria            Urine checked today, sent out. Await results.         Relevant Orders        UA/M w/rflx Culture, Routine                Follow up plan:  Return in about 3 months (around 12/08/2017) for DM, cholesterol, BP follow up.                          Electronically signed by Brianna Carrow, DO at 09/07/2017  5:04 PM               Office Visit on 09/07/2017                  Detailed Report

## 2017-09-19 MED ORDER — CEFAZOLIN SODIUM-DEXTROSE 2-4 GM/100ML-% IV SOLN
2.0000 g | INTRAVENOUS | Status: AC
  Administered 2017-09-20: 2 g via INTRAVENOUS

## 2017-09-20 ENCOUNTER — Ambulatory Visit: Payer: BLUE CROSS/BLUE SHIELD | Admitting: Anesthesiology

## 2017-09-20 ENCOUNTER — Encounter: Payer: Self-pay | Admitting: *Deleted

## 2017-09-20 ENCOUNTER — Ambulatory Visit: Payer: BLUE CROSS/BLUE SHIELD

## 2017-09-20 ENCOUNTER — Encounter
Admission: RE | Disposition: A | Discharge status: Home / Self Care | Payer: Self-pay | Source: Ambulatory Visit | Attending: General Surgery

## 2017-09-20 ENCOUNTER — Ambulatory Visit
Admission: RE | Admit: 2017-09-20 | Discharge: 2017-09-20 | Disposition: A | Discharge status: Home / Self Care | Payer: BLUE CROSS/BLUE SHIELD | Source: Ambulatory Visit | Attending: General Surgery | Admitting: General Surgery

## 2017-09-20 ENCOUNTER — Other Ambulatory Visit: Payer: Self-pay

## 2017-09-20 DIAGNOSIS — K8050 Calculus of bile duct without cholangitis or cholecystitis without obstruction: Secondary | ICD-10-CM

## 2017-09-20 DIAGNOSIS — Z79899 Other long term (current) drug therapy: Secondary | ICD-10-CM | POA: Insufficient documentation

## 2017-09-20 DIAGNOSIS — E785 Hyperlipidemia, unspecified: Secondary | ICD-10-CM | POA: Insufficient documentation

## 2017-09-20 DIAGNOSIS — I2510 Atherosclerotic heart disease of native coronary artery without angina pectoris: Secondary | ICD-10-CM | POA: Diagnosis not present

## 2017-09-20 DIAGNOSIS — Z794 Long term (current) use of insulin: Secondary | ICD-10-CM | POA: Insufficient documentation

## 2017-09-20 DIAGNOSIS — I10 Essential (primary) hypertension: Secondary | ICD-10-CM | POA: Insufficient documentation

## 2017-09-20 DIAGNOSIS — K219 Gastro-esophageal reflux disease without esophagitis: Secondary | ICD-10-CM | POA: Insufficient documentation

## 2017-09-20 DIAGNOSIS — K8020 Calculus of gallbladder without cholecystitis without obstruction: Secondary | ICD-10-CM | POA: Diagnosis not present

## 2017-09-20 DIAGNOSIS — E119 Type 2 diabetes mellitus without complications: Secondary | ICD-10-CM | POA: Insufficient documentation

## 2017-09-20 DIAGNOSIS — K811 Chronic cholecystitis: Secondary | ICD-10-CM | POA: Diagnosis not present

## 2017-09-20 DIAGNOSIS — Z7982 Long term (current) use of aspirin: Secondary | ICD-10-CM | POA: Diagnosis not present

## 2017-09-20 DIAGNOSIS — R1011 Right upper quadrant pain: Secondary | ICD-10-CM | POA: Diagnosis present

## 2017-09-20 HISTORY — PX: CHOLECYSTECTOMY: SHX55

## 2017-09-20 LAB — GLUCOSE, CAPILLARY
Glucose-Capillary: 151 mg/dL — ABNORMAL HIGH (ref 65–99)
Glucose-Capillary: 211 mg/dL — ABNORMAL HIGH (ref 65–99)

## 2017-09-20 SURGERY — LAPAROSCOPIC CHOLECYSTECTOMY WITH INTRAOPERATIVE CHOLANGIOGRAM
Anesthesia: General | Wound class: Clean Contaminated

## 2017-09-20 MED ORDER — SODIUM CHLORIDE 0.9 % IJ SOLN
INTRAMUSCULAR | Status: AC
  Filled 2017-09-20: qty 50

## 2017-09-20 MED ORDER — SUGAMMADEX SODIUM 500 MG/5ML IV SOLN
INTRAVENOUS | Status: DC | PRN
  Administered 2017-09-20: 200 mg via INTRAVENOUS

## 2017-09-20 MED ORDER — FENTANYL CITRATE (PF) 100 MCG/2ML IJ SOLN
INTRAMUSCULAR | Status: DC | PRN
  Administered 2017-09-20: 50 ug via INTRAVENOUS
  Administered 2017-09-20: 100 ug via INTRAVENOUS

## 2017-09-20 MED ORDER — LIDOCAINE HCL (CARDIAC) 20 MG/ML IV SOLN
INTRAVENOUS | Status: DC | PRN
  Administered 2017-09-20: 60 mg via INTRAVENOUS

## 2017-09-20 MED ORDER — ONDANSETRON HCL 4 MG/2ML IJ SOLN
INTRAMUSCULAR | Status: AC
  Filled 2017-09-20: qty 2

## 2017-09-20 MED ORDER — CEFAZOLIN SODIUM-DEXTROSE 2-4 GM/100ML-% IV SOLN
INTRAVENOUS | Status: AC
  Filled 2017-09-20: qty 100

## 2017-09-20 MED ORDER — LACTATED RINGERS IV SOLN
INTRAVENOUS | Status: DC | PRN
  Administered 2017-09-20: 09:00:00 via INTRAVENOUS

## 2017-09-20 MED ORDER — MIDAZOLAM HCL 2 MG/2ML IJ SOLN
INTRAMUSCULAR | Status: DC | PRN
  Administered 2017-09-20: 2 mg via INTRAVENOUS

## 2017-09-20 MED ORDER — ACETAMINOPHEN 10 MG/ML IV SOLN
INTRAVENOUS | Status: DC | PRN
  Administered 2017-09-20: 1000 mg via INTRAVENOUS

## 2017-09-20 MED ORDER — SODIUM CHLORIDE 0.9 % IV SOLN
INTRAVENOUS | Status: DC | PRN
  Administered 2017-09-20: 30 mL

## 2017-09-20 MED ORDER — SODIUM CHLORIDE 0.9 % IV SOLN
INTRAVENOUS | Status: DC
  Administered 2017-09-20: 09:00:00 via INTRAVENOUS

## 2017-09-20 MED ORDER — SEVOFLURANE IN SOLN
RESPIRATORY_TRACT | Status: AC
  Filled 2017-09-20: qty 250

## 2017-09-20 MED ORDER — OXYCODONE HCL 5 MG PO TABS: 5.0000 mg | ORAL_TABLET | Freq: Once | ORAL | Status: DC | PRN

## 2017-09-20 MED ORDER — MIDAZOLAM HCL 2 MG/2ML IJ SOLN
INTRAMUSCULAR | Status: AC
  Filled 2017-09-20: qty 2

## 2017-09-20 MED ORDER — FENTANYL CITRATE (PF) 100 MCG/2ML IJ SOLN
INTRAMUSCULAR | Status: AC
  Administered 2017-09-20: 50 ug via INTRAVENOUS
  Filled 2017-09-20: qty 2

## 2017-09-20 MED ORDER — OXYCODONE HCL 5 MG/5ML PO SOLN: 5.0000 mg | Freq: Once | ORAL | Status: DC | PRN

## 2017-09-20 MED ORDER — PROPOFOL 10 MG/ML IV BOLUS
INTRAVENOUS | Status: AC
  Filled 2017-09-20: qty 40

## 2017-09-20 MED ORDER — ROCURONIUM BROMIDE 100 MG/10ML IV SOLN
INTRAVENOUS | Status: DC | PRN
  Administered 2017-09-20: 50 mg via INTRAVENOUS

## 2017-09-20 MED ORDER — HYDROCODONE-ACETAMINOPHEN 5-325 MG PO TABS: 1.0000 | ORAL_TABLET | ORAL | 0 refills | Status: DC | PRN

## 2017-09-20 MED ORDER — HYDROMORPHONE HCL 1 MG/ML IJ SOLN
INTRAMUSCULAR | Status: DC | PRN
  Administered 2017-09-20: 0.5 mg via INTRAVENOUS

## 2017-09-20 MED ORDER — FENTANYL CITRATE (PF) 250 MCG/5ML IJ SOLN
INTRAMUSCULAR | Status: AC
  Filled 2017-09-20: qty 5

## 2017-09-20 MED ORDER — PROPOFOL 10 MG/ML IV BOLUS
INTRAVENOUS | Status: DC | PRN
  Administered 2017-09-20: 110 mg via INTRAVENOUS

## 2017-09-20 MED ORDER — ONDANSETRON HCL 4 MG/2ML IJ SOLN
INTRAMUSCULAR | Status: DC | PRN
Start: 2017-09-20 — End: 2017-09-20
  Administered 2017-09-20: 4 mg via INTRAVENOUS

## 2017-09-20 MED ORDER — DEXAMETHASONE SODIUM PHOSPHATE 10 MG/ML IJ SOLN
INTRAMUSCULAR | Status: DC | PRN
  Administered 2017-09-20: 5 mg via INTRAVENOUS

## 2017-09-20 MED ORDER — HYDROMORPHONE HCL 1 MG/ML IJ SOLN
INTRAMUSCULAR | Status: AC
  Filled 2017-09-20: qty 1

## 2017-09-20 MED ORDER — FENTANYL CITRATE (PF) 100 MCG/2ML IJ SOLN
25.0000 ug | INTRAMUSCULAR | Status: DC | PRN
  Administered 2017-09-20: 50 ug via INTRAVENOUS

## 2017-09-20 SURGICAL SUPPLY — 37 items
APPLIER CLIP ROT 10 11.4 M/L (STAPLE) ×2
BLADE SURG 11 STRL SS SAFETY (MISCELLANEOUS) ×2 IMPLANT
CANISTER SUCT 1200ML W/VALVE (MISCELLANEOUS) ×2 IMPLANT
CANNULA DILATOR 10 W/SLV (CANNULA) ×2 IMPLANT
CANNULA DILATOR 5 W/SLV (CANNULA) ×4 IMPLANT
CATH CHOLANG 76X19 KUMAR (CATHETERS) ×2 IMPLANT
CHLORAPREP W/TINT 26ML (MISCELLANEOUS) ×2 IMPLANT
CLIP APPLIE ROT 10 11.4 M/L (STAPLE) ×1 IMPLANT
CONRAY 60ML FOR OR (MISCELLANEOUS) ×2 IMPLANT
DISSECTOR KITTNER STICK (MISCELLANEOUS) IMPLANT
DISSECTORS/KITTNER STICK (MISCELLANEOUS)
DRAPE SHEET LG 3/4 BI-LAMINATE (DRAPES) ×2 IMPLANT
DRSG TEGADERM 2-3/8X2-3/4 SM (GAUZE/BANDAGES/DRESSINGS) ×8 IMPLANT
DRSG TELFA 4X3 1S NADH ST (GAUZE/BANDAGES/DRESSINGS) ×2 IMPLANT
ELECT REM PT RETURN 9FT ADLT (ELECTROSURGICAL) ×2
ELECTRODE REM PT RTRN 9FT ADLT (ELECTROSURGICAL) ×1 IMPLANT
GLOVE BIO SURGEON STRL SZ7.5 (GLOVE) ×2 IMPLANT
GLOVE INDICATOR 8.0 STRL GRN (GLOVE) ×2 IMPLANT
GOWN STRL REUS W/ TWL LRG LVL3 (GOWN DISPOSABLE) ×3 IMPLANT
GOWN STRL REUS W/TWL LRG LVL3 (GOWN DISPOSABLE) ×3
IRRIGATION STRYKERFLOW (MISCELLANEOUS) ×1 IMPLANT
IRRIGATOR STRYKERFLOW (MISCELLANEOUS) ×2
IV LACTATED RINGERS 1000ML (IV SOLUTION) ×2 IMPLANT
KIT RM TURNOVER STRD PROC AR (KITS) ×2 IMPLANT
LABEL OR SOLS (LABEL) ×2 IMPLANT
NDL INSUFF ACCESS 14 VERSASTEP (NEEDLE) ×2 IMPLANT
NS IRRIG 500ML POUR BTL (IV SOLUTION) ×2 IMPLANT
PACK LAP CHOLECYSTECTOMY (MISCELLANEOUS) ×2 IMPLANT
POUCH SPECIMEN RETRIEVAL 10MM (ENDOMECHANICALS) IMPLANT
SCISSORS METZENBAUM CVD 33 (INSTRUMENTS) ×2 IMPLANT
STRIP CLOSURE SKIN 1/2X4 (GAUZE/BANDAGES/DRESSINGS) ×2 IMPLANT
SUT VIC AB 0 CT2 27 (SUTURE) ×2 IMPLANT
SUT VIC AB 4-0 FS2 27 (SUTURE) ×2 IMPLANT
SWABSTK COMLB BENZOIN TINCTURE (MISCELLANEOUS) ×2 IMPLANT
TROCAR XCEL NON-BLD 11X100MML (ENDOMECHANICALS) ×2 IMPLANT
TUBING INSUFFLATOR HI FLOW (MISCELLANEOUS) ×2 IMPLANT
WATER STERILE IRR 1000ML POUR (IV SOLUTION) ×2 IMPLANT

## 2017-09-20 NOTE — OR Nursing (Addendum)
Dr. Lemar LivingsByrnett into see pt and family. Discussed pt's feeling of constipation, OK to dc home use Milk of Mag to help with constipation.

## 2017-09-20 NOTE — H&P (Signed)
Brianna Leon [id number] Sep 23, 1956     HPI: 61 year old woman with recurrent epigastric and right upper quadrant pain.  Ultrasound was negative but HIDA scan showed a markedly high ejection fraction 90% or greater.  This reproduced her symptoms.  Plans for elective cholecystectomy.  Medications Prior to Admission  Medication Sig Dispense Refill Last Dose  . aspirin EC 81 MG tablet Take 81 mg by mouth daily.   09/16/2017  . aspirin-acetaminophen-caffeine (EXCEDRIN MIGRAINE) 250-250-65 MG tablet Take 2 tablets by mouth every 6 (six) hours as needed for headache.   09/06/2017  . Aspirin-Caffeine (BAYER BACK & BODY) 500-32.5 MG TABS Take 2 tablets by mouth daily as needed (for pain).   09/15/2017  . glimepiride (AMARYL) 2 MG tablet Take 2 mg by mouth daily with breakfast.   09/19/2017 at Unknown time  . insulin glargine (LANTUS) 100 unit/mL SOPN Inject 20 Units into the skin at bedtime.   09/19/2017 at Unknown time  . meclizine (ANTIVERT) 25 MG tablet Take 1 tablet (25 mg total) by mouth 3 (three) times daily as needed for dizziness. 30 tablet 0 07/21/2017  . Multiple Vitamins-Minerals (OCUVITE ADULT 50+ PO) Take 1 capsule by mouth daily.   09/14/2017  . omeprazole (PRILOSEC) 20 MG capsule Take 1 capsule (20 mg total) by mouth daily. (Patient taking differently: Take 20 mg every morning by mouth. ) 30 capsule 3 09/20/2017 at Unknown time  . OVER THE COUNTER MEDICATION Take 2 capsules by mouth daily. Hearing Health Supplement   09/18/2017  . promethazine (PHENERGAN) 25 MG tablet Take 25 mg by mouth every 6 (six) hours as needed for nausea or vomiting.   09/18/2017  . simvastatin (ZOCOR) 40 MG tablet Take 40 mg by mouth at bedtime.    09/19/2017 at Unknown time  . blood glucose meter kit and supplies KIT Dispense based on patient and insurance preference. Use up to four times daily as directed. (FOR ICD-9 250.00, 250.01). (Patient not taking: Reported on 08/31/2017) 1 each 0 Not Taking at Unknown time   . dicyclomine (BENTYL) 10 MG capsule Take 1 capsule (10 mg total) by mouth 4 (four) times daily -  before meals and at bedtime. (Patient not taking: Reported on 08/31/2017) 30 capsule 0 Not Taking at Unknown time   Allergies  Allergen Reactions  . Carafate [Sucralfate] Anaphylaxis and Hives  . Atorvastatin Other (See Comments)    Unknown  . Metformin And Related Diarrhea  . Other Hives and Other (See Comments)    Lysol Cleaning Products  . Penicillins Nausea And Vomiting  . Tramadol Nausea Only   Past Medical History:  Diagnosis Date  . Arthritis    KNEES  . CAD (coronary artery disease)   . DM (diabetes mellitus), type 2 ([place]) 2000   insulin started 2016  . Elevated liver enzymes   . Family history of colonic polyps    father  . GERD (gastroesophageal reflux disease)   . Hemorrhage    DURING PREGNANCY 31 YEARS AGO  . Hyperlipidemia   . IBS (irritable bowel syndrome)   . Migraine    Past Surgical History:  Procedure Laterality Date  . ABDOMINAL HYSTERECTOMY  22 years ago  . CARDIAC CATHETERIZATION     x 2  . CARPAL TUNNEL RELEASE Right   . COLONOSCOPY  05/18/2015   repeat 5 years  . ESOPHAGOGASTRODUODENOSCOPY  05/18/2015  . KNEE ARTHROSCOPY  2001 and 2009  . TONSILLECTOMY     Social History   Socioeconomic History  .  Marital status: Married    Spouse name: Not on file  . Number of children: Not on file  . Years of education: Not on file  . Highest education level: Not on file  Social Needs  . Financial resource strain: Not on file  . Food insecurity - worry: Not on file  . Food insecurity - inability: Not on file  . Transportation needs - medical: Not on file  . Transportation needs - non-medical: Not on file  Occupational History  . Not on file  Tobacco Use  . Smoking status: Never Smoker  . Smokeless tobacco: Never Used  Substance and Sexual Activity  . Alcohol use: No  . Drug use: No  . Sexual activity: Yes  Other Topics Concern  . Not on file   Social History Narrative  . Not on file   Social History   Social History Narrative  . Not on file     ROS: Negative.     PE: HEENT: Negative. Lungs: Clear. Cardio: RR.  Assessment/Plan:  Proceed with planned cholecystectomy.   Robert Bellow 09/20/2017

## 2017-09-20 NOTE — Discharge Instructions (Signed)

## 2017-09-20 NOTE — Transfer of Care (Signed)
Immediate Anesthesia Transfer of Care Note  [facility]  Procedure(s) Performed: LAPAROSCOPIC CHOLECYSTECTOMY WITH INTRAOPERATIVE CHOLANGIOGRAM (N/A )  Patient Location: PACU  Anesthesia Type:General  Level of Consciousness: sedated  Airway & Oxygen Therapy: Patient Spontanous Breathing  Post-op Assessment: Report given to RN and Post -op Vital signs reviewed and stable  Post vital signs: Reviewed and stable  Last Vitals:  Vitals:   09/20/17 1008 09/20/17 1009  BP: (!) 133/48 (!) 133/48  Pulse: 76 74  Resp: (!) 27 (!) 24  Temp: (!) 36.4 C   SpO2: 96% 96%    Last Pain:  Vitals:   09/20/17 1008  TempSrc:   PainSc: Asleep         Complications: No apparent anesthesia complications

## 2017-09-20 NOTE — Anesthesia Procedure Notes (Signed)
Procedure Name: Intubation Date/Time: 09/20/2017 9:42 AM Performed by: Almeta MonasFletcher, Milbert Bixler, CRNA Pre-anesthesia Checklist: Patient identified, Patient being monitored, Timeout performed, Emergency Drugs available and Suction available Patient Re-evaluated:Patient Re-evaluated prior to induction Oxygen Delivery Method: Circle system utilized Preoxygenation: Pre-oxygenation with 100% oxygen Induction Type: IV induction Ventilation: Mask ventilation without difficulty Laryngoscope Size: Miller and 2 Grade View: Grade I Tube type: Oral Tube size: 7.0 mm Number of attempts: 1 Airway Equipment and Method: Stylet Placement Confirmation: ETT inserted through vocal cords under direct vision,  positive ETCO2 and breath sounds checked- equal and bilateral Secured at: 21 cm Tube secured with: Tape Dental Injury: Teeth and Oropharynx as per pre-operative assessment  Difficulty Due To: Difficult Airway- due to anterior larynx and Difficulty was anticipated Future Recommendations: Recommend- induction with short-acting agent, and alternative techniques readily available

## 2017-09-20 NOTE — Anesthesia Post-op Follow-up Note (Signed)
Anesthesia QCDR form completed.        

## 2017-09-20 NOTE — Anesthesia Preprocedure Evaluation (Signed)
Anesthesia Evaluation  Patient identified by MRN, date of birth, ID band Patient awake    Reviewed: Allergy & Precautions, H&P , NPO status , Patient's Chart, lab work & pertinent test results  History of Anesthesia Complications Negative for: history of anesthetic complications  Airway Mallampati: III  TM Distance: <3 FB Neck ROM: full    Dental  (+) Chipped   Pulmonary neg pulmonary ROS, neg shortness of breath,           Cardiovascular Exercise Tolerance: Good hypertension, (-) angina+ CAD  (-) Past MI and (-) DOE      Neuro/Psych  Headaches, negative psych ROS   GI/Hepatic Neg liver ROS, GERD  Medicated and Controlled,  Endo/Other  diabetes, Type 2  Renal/GU      Musculoskeletal  (+) Arthritis ,   Abdominal   Peds  Hematology negative hematology ROS (+)   Anesthesia Other Findings Past Medical History: No date: Arthritis     Comment:  KNEES No date: CAD (coronary artery disease) 2000: DM (diabetes mellitus), type 2 (HCC)     Comment:  insulin started 2016 No date: Elevated liver enzymes No date: Family history of colonic polyps     Comment:  father No date: GERD (gastroesophageal reflux disease) No date: Hemorrhage     Comment:  DURING PREGNANCY 31 YEARS AGO No date: Hyperlipidemia No date: IBS (irritable bowel syndrome) No date: Migraine  Past Surgical History: 22 years ago: ABDOMINAL HYSTERECTOMY No date: CARDIAC CATHETERIZATION     Comment:  x 2 No date: CARPAL TUNNEL RELEASE; Right 05/18/2015: COLONOSCOPY     Comment:  repeat 5 years 05/18/2015: ESOPHAGOGASTRODUODENOSCOPY 2001 and 2009: KNEE ARTHROSCOPY No date: TONSILLECTOMY  BMI    Body Mass Index:  22.68 kg/m      Reproductive/Obstetrics negative OB ROS                             Anesthesia Physical Anesthesia Plan  ASA: III  Anesthesia Plan: General ETT   Post-op Pain Management:    Induction:  Intravenous  PONV Risk Score and Plan: 4 or greater and Ondansetron, Dexamethasone and Midazolam  Airway Management Planned: Oral ETT  Additional Equipment:   Intra-op Plan:   Post-operative Plan: Extubation in OR  Informed Consent: I have reviewed the patients History and Physical, chart, labs and discussed the procedure including the risks, benefits and alternatives for the proposed anesthesia with the patient or authorized representative who has indicated his/her understanding and acceptance.   Dental Advisory Given  Plan Discussed with: Anesthesiologist, CRNA and Surgeon  Anesthesia Plan Comments: (Patient consented for risks of anesthesia including but not limited to:  - adverse reactions to medications - damage to teeth, lips or other oral mucosa - sore throat or hoarseness - Damage to heart, brain, lungs or loss of life  Patient voiced understanding.)        Anesthesia Quick Evaluation

## 2017-09-20 NOTE — Op Note (Signed)
Preoperative diagnosis: Chronic cholecystitis.  Postoperative diagnosis: Same.  Operative procedure: Laparoscopic cholecystectomy with intraoperative cholangiograms.  Operating Surgeon: Donnalee CurryJeffrey Solene Hereford, MD.  Anesthesia: General endotracheal.  Estimated blood loss: Less than 5 cc.  Clinical note: This 61 year old woman has had episodic right upper quadrant and epigastric pain.  Ultrasound was negative but HIDA scan reproduced her symptoms with a very high ejection fraction of 90+ percent.  She failed to improve her symptoms with use of Bentyl.  She is brought to the operating at this time for planned cholecystectomy.  Operative note: With the patient under adequate general endotracheal anesthesia the abdomen was cleansed with ChloraPrep and draped.  In Trendelenburg position a varies needle was placed through a trans-umbilical incision.  After assuring intra-abdominal location with a hanging drop test the abdomen was insufflated with CO2 at 10 mmHg pressure.  A 10 mm Step port was expanded and inspection showed no evidence of injury from initial port placement.  The patient was placed in the reverse Trendelenburg position and rolled to the left.  An 11 mm XL port was placed in the epigastrium under direct vision and 2-5 mm Step ports were placed under direct vision in the right lateral abdominal wall.  The gallbladder was placed on cephalad traction.  Minimal evidence of gallbladder inflammation.  Moderate inflammation near the neck of the gallbladder.  The cystic duct and cystic artery were identified and separated.  A Kumar clamp was placed and fluoroscopic cholangiograms completed using a total of 30 cc of one half strength Conray 60.  This showed prompt filling of the common bile duct and free flowing of the duodenum and later reflux in the common hepatic duct.  Incomplete visualization of the intrahepatic ducts.  The cystic duct and cystic artery were doubly clipped and divided.  The gallbladder  was removed from the liver bed making use of hook cautery dissection.  It was delivered to the umbilical port site.  While it was being extracted bile sprayed from the gallbladder to the right side of the abdomen.  After the gallbladder was removed and pneumoperitoneum reestablished the abdomen was irrigated with 1.5 L of lactated Ringer solution.  Inspection of the gallbladder showed no stones but thickened mucosa consistent with chronic cholecystitis.  Inspection of the liver bed showed good hemostasis.  The lateral ports were removed and the abdomen desufflated under direct vision.  The fascia at the umbilicus and epigastric site was approximated with a simple 0 Vicryl suture.  Skin incisions were closed with 4-0 Vicryl subarticular sutures.  Benzoin, Steri-Strips, Telfa and Tegaderm dressings applied.  The patient tolerated the procedure well and was taken to the recovery room in stable condition.

## 2017-09-20 NOTE — Anesthesia Postprocedure Evaluation (Signed)
Anesthesia Post Note  Patient: Brianna Leon  Procedure(s) Performed: LAPAROSCOPIC CHOLECYSTECTOMY WITH INTRAOPERATIVE CHOLANGIOGRAM (N/A )  Patient location during evaluation: PACU Anesthesia Type: General Level of consciousness: awake and alert Pain management: pain level controlled Vital Signs Assessment: post-procedure vital signs reviewed and stable Respiratory status: spontaneous breathing, nonlabored ventilation, respiratory function stable and patient connected to nasal cannula oxygen Cardiovascular status: blood pressure returned to baseline and stable Postop Assessment: no apparent nausea or vomiting Anesthetic complications: no     Last Vitals:  Vitals:   09/20/17 1148 09/20/17 1227  BP: (!) 156/100 (!) 151/61  Pulse: 74 69  Resp: 20 18  Temp:    SpO2: 100% 99%    Last Pain:  Vitals:   09/20/17 1227  TempSrc:   PainSc: 9                  Jabari Swoveland K Seanmichael Salmons

## 2017-09-21 LAB — SURGICAL PATHOLOGY

## 2017-10-03 ENCOUNTER — Ambulatory Visit (INDEPENDENT_AMBULATORY_CARE_PROVIDER_SITE_OTHER): Payer: BLUE CROSS/BLUE SHIELD | Admitting: General Surgery

## 2017-10-03 ENCOUNTER — Encounter: Payer: Self-pay | Admitting: General Surgery

## 2017-10-03 VITALS — BP 126/72 | HR 94 | Resp 12 | Ht 62.0 in | Wt 127.0 lb

## 2017-10-03 DIAGNOSIS — K8050 Calculus of bile duct without cholangitis or cholecystitis without obstruction: Secondary | ICD-10-CM

## 2017-10-03 NOTE — Progress Notes (Signed)
Patient ID: Brianna Leon, female   DOB: Oct 17, 1956, 61 y.o.   MRN: [id number]  Chief Complaint  Patient presents with  . Routine Post Op    HPI Brianna Leon is a 61 y.o. female here today for her post op gallbladder surgery done on 09/20/2017. Patient is doing well. Bowels moving regular. The patient underwent cholecystectomy for a very high HIDA scan ejection fraction which reproduced her pain. No pain since surgery. Some foods are causing loose stools.  HPI  Past Medical History:  Diagnosis Date  . Arthritis    KNEES  . CAD (coronary artery disease)   . DM (diabetes mellitus), type 2 ([facility]) 2000   insulin started 2016  . Elevated liver enzymes   . Family history of colonic polyps    father  . GERD (gastroesophageal reflux disease)   . Hemorrhage    DURING PREGNANCY 31 YEARS AGO  . Hyperlipidemia   . IBS (irritable bowel syndrome)   . Migraine     Past Surgical History:  Procedure Laterality Date  . ABDOMINAL HYSTERECTOMY  22 years ago  . CARDIAC CATHETERIZATION     x 2  . CARPAL TUNNEL RELEASE Right   . CHOLECYSTECTOMY N/A 09/20/2017   Procedure: LAPAROSCOPIC CHOLECYSTECTOMY WITH INTRAOPERATIVE CHOLANGIOGRAM;  Surgeon: Robert Bellow, MD;  Location: [facility] ORS;  Service: General;  Laterality: N/A;  . COLONOSCOPY  05/18/2015   repeat 5 years  . ESOPHAGOGASTRODUODENOSCOPY  05/18/2015  . KNEE ARTHROSCOPY  2001 and 2009  . TONSILLECTOMY      Family History  Problem Relation Age of Onset  . Colon polyps Father   . Diabetes Father   . Hypertension Father   . Heart disease Father   . Stroke Father   . Diabetes Mother   . Hypertension Mother   . Diabetes Brother   . Heart failure Brother        pacemaker  . Hypertension Brother   . Heart attack Brother   . Migraines Daughter   . Lupus Son   . Diabetes Maternal Grandmother   . Heart disease Maternal Grandmother   . Stroke Maternal Grandfather   . Hypertension Paternal Grandmother   . Stroke Paternal  Grandfather   . Hyperlipidemia Brother   . Breast cancer Neg Hx     Social History Social History   Tobacco Use  . Smoking status: Never Smoker  . Smokeless tobacco: Never Used  Substance Use Topics  . Alcohol use: No  . Drug use: No    Allergies  Allergen Reactions  . Carafate [Sucralfate] Anaphylaxis and Hives  . Atorvastatin Other (See Comments)    Unknown  . Metformin And Related Diarrhea  . Other Hives and Other (See Comments)    Lysol Cleaning Products  . Penicillins Nausea And Vomiting  . Tramadol Nausea Only    Current Outpatient Medications  Medication Sig Dispense Refill  . aspirin EC 81 MG tablet Take 81 mg by mouth daily.    Marland Kitchen aspirin-acetaminophen-caffeine (EXCEDRIN MIGRAINE) 250-250-65 MG tablet Take 2 tablets by mouth every 6 (six) hours as needed for headache.    . Aspirin-Caffeine (BAYER BACK & BODY) 500-32.5 MG TABS Take 2 tablets by mouth daily as needed (for pain).    . blood glucose meter kit and supplies KIT Dispense based on patient and insurance preference. Use up to four times daily as directed. (FOR ICD-9 250.00, 250.01). 1 each 0  . glimepiride (AMARYL) 2 MG tablet Take 2 mg by mouth  daily with breakfast.    . insulin glargine (LANTUS) 100 unit/mL SOPN Inject 20 Units into the skin at bedtime.    . meclizine (ANTIVERT) 25 MG tablet Take 1 tablet (25 mg total) by mouth 3 (three) times daily as needed for dizziness. 30 tablet 0  . Multiple Vitamins-Minerals (OCUVITE ADULT 50+ PO) Take 1 capsule by mouth daily.    Marland Kitchen omeprazole (PRILOSEC) 20 MG capsule Take 1 capsule (20 mg total) by mouth daily. (Patient taking differently: Take 20 mg every morning by mouth. ) 30 capsule 3  . OVER THE COUNTER MEDICATION Take 2 capsules by mouth daily. Hearing Health Supplement    . promethazine (PHENERGAN) 25 MG tablet Take 25 mg by mouth every 6 (six) hours as needed for nausea or vomiting.    . simvastatin (ZOCOR) 40 MG tablet Take 40 mg by mouth at bedtime.       No current facility-administered medications for this visit.     Review of Systems Review of Systems  Constitutional: Negative.   Respiratory: Negative.   Cardiovascular: Negative.     Blood pressure 126/72, pulse 94, resp. rate 12, height '5\' 2"'  (1.575 m), weight 127 lb (57.6 kg).  Physical Exam Physical Exam  Constitutional: She is oriented to person, place, and time. She appears well-developed and well-nourished.  Abdominal:  Port sites are clean and well healed.   Neurological: She is alert and oriented to person, place, and time.  Skin: Skin is warm and dry.  Psychiatric: Her behavior is normal.    Data Reviewed DIAGNOSIS:  A. GALLBLADDER; CHOLECYSTECTOMY:  - EARLY CHRONIC CHOLECYSTITIS.  - NEGATIVE FOR MALIGNANCY.   Assessment    Doing well status post cholecystectomy.    Plan    Patient may increase activities as tolerated. ..  She may find over the coming months that foods that presently cause intolerance will be better tolerated the further she gets out from surgery.    Return as needed. The patient is aware to call back for any questions or concerns.   HPI, Physical Exam, Assessment and Plan have been scribed under the direction and in the presence of Robert Bellow, MD. Karie Fetch, RN  I have completed the exam and reviewed the above documentation for accuracy and completeness.  I agree with the above.  Haematologist has been used and any errors in dictation or transcription are unintentional.  Hervey Ard, M.D., F.A.C.S.  Robert Bellow 10/03/2017, 8:46 PM

## 2017-10-03 NOTE — Patient Instructions (Signed)
The patient is aware to call back for any questions or concerns.  

## 2017-10-29 LAB — BAYER DCA HB A1C WAIVED: HB A1C (BAYER DCA - WAIVED): 9.7 % — ABNORMAL HIGH (ref <7.0)

## 2017-11-29 ENCOUNTER — Other Ambulatory Visit: Payer: Self-pay | Admitting: Obstetrics & Gynecology

## 2017-11-29 DIAGNOSIS — Z1211 Encounter for screening for malignant neoplasm of colon: Secondary | ICD-10-CM | POA: Diagnosis not present

## 2017-11-29 DIAGNOSIS — Z01419 Encounter for gynecological examination (general) (routine) without abnormal findings: Secondary | ICD-10-CM | POA: Diagnosis not present

## 2017-11-29 DIAGNOSIS — Z1231 Encounter for screening mammogram for malignant neoplasm of breast: Secondary | ICD-10-CM | POA: Diagnosis not present

## 2017-11-29 DIAGNOSIS — H5203 Hypermetropia, bilateral: Secondary | ICD-10-CM | POA: Diagnosis not present

## 2017-11-29 LAB — HM DIABETES EYE EXAM

## 2017-12-14 ENCOUNTER — Encounter: Payer: Self-pay | Admitting: Family Medicine

## 2017-12-14 ENCOUNTER — Ambulatory Visit: Payer: BLUE CROSS/BLUE SHIELD | Admitting: Family Medicine

## 2017-12-14 VITALS — BP 103/66 | HR 91 | Temp 98.0°F | Wt 122.0 lb

## 2017-12-14 DIAGNOSIS — F419 Anxiety disorder, unspecified: Secondary | ICD-10-CM | POA: Diagnosis not present

## 2017-12-14 DIAGNOSIS — K219 Gastro-esophageal reflux disease without esophagitis: Secondary | ICD-10-CM

## 2017-12-14 DIAGNOSIS — E782 Mixed hyperlipidemia: Secondary | ICD-10-CM | POA: Diagnosis not present

## 2017-12-14 DIAGNOSIS — E118 Type 2 diabetes mellitus with unspecified complications: Secondary | ICD-10-CM | POA: Diagnosis not present

## 2017-12-14 DIAGNOSIS — I10 Essential (primary) hypertension: Secondary | ICD-10-CM | POA: Diagnosis not present

## 2017-12-14 MED ORDER — SIMVASTATIN 40 MG PO TABS: 40.0000 mg | ORAL_TABLET | Freq: Every day | ORAL | 1 refills | Status: DC

## 2017-12-14 MED ORDER — GLIMEPIRIDE 2 MG PO TABS: 2.0000 mg | ORAL_TABLET | Freq: Every day | ORAL | 1 refills | Status: DC

## 2017-12-14 MED ORDER — LORAZEPAM 0.5 MG PO TABS: 0.5000 mg | ORAL_TABLET | Freq: Two times a day (BID) | ORAL | 0 refills | Status: DC | PRN

## 2017-12-14 MED ORDER — OMEPRAZOLE 20 MG PO CPDR: 20.0000 mg | DELAYED_RELEASE_CAPSULE | ORAL | 1 refills | Status: DC

## 2017-12-14 NOTE — Patient Instructions (Addendum)
Start the xultophy injection sample 16 units a day- I'll call you Monday or Tuesday to see how you're doing on it.   Knee Exercises Ask your health care provider which exercises are safe for you. Do exercises exactly as told by your health care provider and adjust them as directed. It is normal to feel mild stretching, pulling, tightness, or discomfort as you do these exercises, but you should stop right away if you feel sudden pain or your pain gets worse.Do not begin these exercises until told by your health care provider. STRETCHING AND RANGE OF MOTION EXERCISES These exercises warm up your muscles and joints and improve the movement and flexibility of your knee. These exercises also help to relieve pain, numbness, and tingling. Exercise A: Knee Extension, Prone 1. Lie on your abdomen on a bed. 2. Place your left / right knee just beyond the edge of the surface so your knee is not on the bed. You can put a towel under your left / right thigh just above your knee for comfort. 3. Relax your leg muscles and allow gravity to straighten your knee. You should feel a stretch behind your left / right knee. 4. Hold this position for __________ seconds. 5. Scoot up so your knee is supported between repetitions. Repeat __________ times. Complete this stretch __________ times a day. Exercise B: Knee Flexion, Active  1. Lie on your back with both knees straight. If this causes back discomfort, bend your left / right knee so your foot is flat on the floor. 2. Slowly slide your left / right heel back toward your buttocks until you feel a gentle stretch in the front of your knee or thigh. 3. Hold this position for __________ seconds. 4. Slowly slide your left / right heel back to the starting position. Repeat __________ times. Complete this exercise __________ times a day. Exercise C: Quadriceps, Prone  1. Lie on your abdomen on a firm surface, such as a bed or padded floor. 2. Bend your left / right knee  and hold your ankle. If you cannot reach your ankle or pant leg, loop a belt around your foot and grab the belt instead. 3. Gently pull your heel toward your buttocks. Your knee should not slide out to the side. You should feel a stretch in the front of your thigh and knee. 4. Hold this position for __________ seconds. Repeat __________ times. Complete this stretch __________ times a day. Exercise D: Hamstring, Supine 1. Lie on your back. 2. Loop a belt or towel over the ball of your left / right foot. The ball of your foot is on the walking surface, right under your toes. 3. Straighten your left / right knee and slowly pull on the belt to raise your leg until you feel a gentle stretch behind your knee. ? Do not let your left / right knee bend while you do this. ? Keep your other leg flat on the floor. 4. Hold this position for __________ seconds. Repeat __________ times. Complete this stretch __________ times a day. STRENGTHENING EXERCISES These exercises build strength and endurance in your knee. Endurance is the ability to use your muscles for a long time, even after they get tired. Exercise E: Quadriceps, Isometric  1. Lie on your back with your left / right leg extended and your other knee bent. Put a rolled towel or small pillow under your knee if told by your health care provider. 2. Slowly tense the muscles in the front of  your left / right thigh. You should see your kneecap slide up toward your hip or see increased dimpling just above the knee. This motion will push the back of the knee toward the floor. 3. For __________ seconds, keep the muscle as tight as you can without increasing your pain. 4. Relax the muscles slowly and completely. Repeat __________ times. Complete this exercise __________ times a day. Exercise F: Straight Leg Raises - Quadriceps 1. Lie on your back with your left / right leg extended and your other knee bent. 2. Tense the muscles in the front of your left /  right thigh. You should see your kneecap slide up or see increased dimpling just above the knee. Your thigh may even shake a bit. 3. Keep these muscles tight as you raise your leg 4-6 inches (10-15 cm) off the floor. Do not let your knee bend. 4. Hold this position for __________ seconds. 5. Keep these muscles tense as you lower your leg. 6. Relax your muscles slowly and completely after each repetition. Repeat __________ times. Complete this exercise __________ times a day. Exercise G: Hamstring, Isometric 1. Lie on your back on a firm surface. 2. Bend your left / right knee approximately __________ degrees. 3. Dig your left / right heel into the surface as if you are trying to pull it toward your buttocks. Tighten the muscles in the back of your thighs to dig as hard as you can without increasing any pain. 4. Hold this position for __________ seconds. 5. Release the tension gradually and allow your muscles to relax completely for __________ seconds after each repetition. Repeat __________ times. Complete this exercise __________ times a day. Exercise H: Hamstring Curls  If told by your health care provider, do this exercise while wearing ankle weights. Begin with __________ weights. Then increase the weight by 1 lb (0.5 kg) increments. Do not wear ankle weights that are more than __________. 1. Lie on your abdomen with your legs straight. 2. Bend your left / right knee as far as you can without feeling pain. Keep your hips flat against the floor. 3. Hold this position for __________ seconds. 4. Slowly lower your leg to the starting position.  Repeat __________ times. Complete this exercise __________ times a day. Exercise I: Squats (Quadriceps) 1. Stand in front of a table, with your feet and knees pointing straight ahead. You may rest your hands on the table for balance but not for support. 2. Slowly bend your knees and lower your hips like you are going to sit in a chair. ? Keep your  weight over your heels, not over your toes. ? Keep your lower legs upright so they are parallel with the table legs. ? Do not let your hips go lower than your knees. ? Do not bend lower than told by your health care provider. ? If your knee pain increases, do not bend as low. 3. Hold the squat position for __________ seconds. 4. Slowly push with your legs to return to standing. Do not use your hands to pull yourself to standing. Repeat __________ times. Complete this exercise __________ times a day. Exercise J: Wall Slides (Quadriceps)  1. Lean your back against a smooth wall or door while you walk your feet out 18-24 inches (46-61 cm) from it. 2. Place your feet hip-width apart. 3. Slowly slide down the wall or door until your knees bend __________ degrees. Keep your knees over your heels, not over your toes. Keep your knees in line  with your hips. 4. Hold for __________ seconds. Repeat __________ times. Complete this exercise __________ times a day. Exercise K: Straight Leg Raises - Hip Abductors 1. Lie on your side with your left / right leg in the top position. Lie so your head, shoulder, knee, and hip line up. You may bend your bottom knee to help you keep your balance. 2. Roll your hips slightly forward so your hips are stacked directly over each other and your left / right knee is facing forward. 3. Leading with your heel, lift your top leg 4-6 inches (10-15 cm). You should feel the muscles in your outer hip lifting. ? Do not let your foot drift forward. ? Do not let your knee roll toward the ceiling. 4. Hold this position for __________ seconds. 5. Slowly return your leg to the starting position. 6. Let your muscles relax completely after each repetition. Repeat __________ times. Complete this exercise __________ times a day. Exercise L: Straight Leg Raises - Hip Extensors 1. Lie on your abdomen on a firm surface. You can put a pillow under your hips if that is more  comfortable. 2. Tense the muscles in your buttocks and lift your left / right leg about 4-6 inches (10-15 cm). Keep your knee straight as you lift your leg. 3. Hold this position for __________ seconds. 4. Slowly lower your leg to the starting position. 5. Let your leg relax completely after each repetition. Repeat __________ times. Complete this exercise __________ times a day. This information is not intended to replace advice given to you by your health care provider. Make sure you discuss any questions you have with your health care provider. Document Released: 09/06/2005 Document Revised: 07/17/2016 Document Reviewed: 08/29/2015 Elsevier Interactive Patient Education  2018 [employer].

## 2017-12-14 NOTE — Assessment & Plan Note (Signed)
Doing much better. Barely taking her omeprazole. Call with any concerns. Checking CBC today.

## 2017-12-14 NOTE — Progress Notes (Signed)
BP 103/66 (BP Location: Left Arm, Patient Position: Sitting, Cuff Size: Normal)   Pulse 91   Temp 98 F (36.7 C)   Wt 122 lb (55.3 kg)   BMI 22.31 kg/m    Subjective:    Patient ID: Brianna Leon, female    DOB: 12/12/1955, 62 y.o.   MRN: [id number]  HPI: Brianna Leon is a 62 y.o. female  Chief Complaint  Patient presents with  . Diabetes  . Hyperlipidemia  . Hypertension   DIABETES Hypoglycemic episodes:yes- having a lot of lows- has not been taking the time to eat, had been caring for her mother Polydipsia/polyuria: no Visual disturbance: no Chest pain: no Paresthesias: no Glucose Monitoring: yes  Accucheck frequency: occasionally Taking Insulin?: yes  Long acting insulin: lantus Blood Pressure Monitoring: not checking Retinal Examination: Up to Date Foot Exam: Up to Date Diabetic Education: Completed Pneumovax: Up to Date Influenza: Up to Date Aspirin: yes  HYPERTENSION / HYPERLIPIDEMIA Satisfied with current treatment? yes Duration of hypertension: chronic BP monitoring frequency: not checking BP medication side effects: no Past BP meds: none right now Duration of hyperlipidemia: chronic Cholesterol medication side effects: no Cholesterol supplements: none Past cholesterol medications: simvastatin (zocor) Medication compliance: excellent compliance Aspirin: yes Recent stressors: yes Recurrent headaches: no Visual changes: no Palpitations: no Dyspnea: no Chest pain: no Lower extremity edema: no Dizzy/lightheaded: no   Has been under a huge amount of stress. Her sister is currently on hospice. She has been caring for her elderly mother. Has been under a huge amount of stress. Has taken xanax in the past to help with stress- takes it very occasionally. Would like something to help with the stress.    Relevant past medical, surgical, family and social history reviewed and updated as indicated. Interim medical history since our last visit  reviewed. Allergies and medications reviewed and updated.  Review of Systems  Constitutional: Negative.   Respiratory: Negative.   Cardiovascular: Negative.   Musculoskeletal: Positive for arthralgias. Negative for back pain, gait problem, joint swelling, myalgias, neck pain and neck stiffness.  Skin: Negative.   Neurological: Negative.   Psychiatric/Behavioral: Negative for agitation, behavioral problems, confusion, decreased concentration, dysphoric mood, hallucinations, self-injury, sleep disturbance and suicidal ideas. The patient is nervous/anxious. The patient is not hyperactive.     Per HPI unless specifically indicated above     Objective:    BP 103/66 (BP Location: Left Arm, Patient Position: Sitting, Cuff Size: Normal)   Pulse 91   Temp 98 F (36.7 C)   Wt 122 lb (55.3 kg)   BMI 22.31 kg/m   Wt Readings from Last 3 Encounters:  12/14/17 122 lb (55.3 kg)  10/03/17 127 lb (57.6 kg)  09/20/17 124 lb (56.2 kg)    Physical Exam  Constitutional: She is oriented to person, place, and time. She appears well-developed and well-nourished. No distress.  HENT:  Head: Normocephalic and atraumatic.  Right Ear: Hearing normal.  Left Ear: Hearing normal.  Nose: Nose normal.  Eyes: Conjunctivae and lids are normal. Right eye exhibits no discharge. Left eye exhibits no discharge. No scleral icterus.  Cardiovascular: Normal rate, regular rhythm, normal heart sounds and intact distal pulses. Exam reveals no gallop and no friction rub.  No murmur heard. Pulmonary/Chest: Effort normal and breath sounds normal. No respiratory distress. She has no wheezes. She has no rales. She exhibits no tenderness.  Musculoskeletal: Normal range of motion.  Neurological: She is alert and oriented to person, place, and time.  Skin: Skin is warm, dry and intact. No rash noted. She is not diaphoretic. No erythema. No pallor.  Psychiatric: She has a normal mood and affect. Her speech is normal and  behavior is normal. Judgment and thought content normal. Cognition and memory are normal.  Nursing note and vitals reviewed.   Results for orders placed or performed in visit on 12/14/17  Bayer DCA Hb A1c Waived  Result Value Ref Range   Bayer DCA Hb A1c Waived 9.6 (H) <7.0 %  Microalbumin, Urine Waived  Result Value Ref Range   Microalb, Ur Waived WILL FOLLOW    Creatinine, Urine Waived WILL FOLLOW    Microalb/Creat Ratio WILL FOLLOW       Assessment & Plan:   Problem List Items Addressed This Visit      Cardiovascular and Mediastinum   Hypertension    Under good control. Continue current regimen. Call with any concerns.       Relevant Medications   simvastatin (ZOCOR) 40 MG tablet   Other Relevant Orders   Comprehensive metabolic panel   Microalbumin, Urine Waived (Completed)     Digestive   GERD (gastroesophageal reflux disease)    Doing much better. Barely taking her omeprazole. Call with any concerns. Checking CBC today.      Relevant Medications   omeprazole (PRILOSEC) 20 MG capsule   Other Relevant Orders   CBC with Differential/Platelet   Comprehensive metabolic panel     Endocrine   DM (diabetes mellitus), type 2 (HCC) - Primary    Better with A1c of 9.6 down from 10.4, but still not under good control. Will change lantus to xultophy and recheck 1 month. Call with any concerns.       Relevant Medications   simvastatin (ZOCOR) 40 MG tablet   glimepiride (AMARYL) 2 MG tablet   Other Relevant Orders   Bayer DCA Hb A1c Waived (Completed)   Comprehensive metabolic panel   Microalbumin, Urine Waived (Completed)     Other   Hyperlipidemia    Rechecking levels today. Refills given. Call with any concerns.       Relevant Medications   simvastatin (ZOCOR) 40 MG tablet   Other Relevant Orders   Comprehensive metabolic panel   Lipid Panel w/o Chol/HDL Ratio    Other Visit Diagnoses    Acute anxiety       Sister is on hospice. She is currently caring for  her elderly mother. Having a lot of stress. Will start some lorazepam to be used with extreme stress.    Relevant Medications   LORazepam (ATIVAN) 0.5 MG tablet       Follow up plan: Return in about 4 weeks (around 01/11/2018) for follow up sugars.

## 2017-12-14 NOTE — Assessment & Plan Note (Signed)
Under good control. Continue current regimen. Call with any concerns.  

## 2017-12-14 NOTE — Assessment & Plan Note (Signed)
Better with A1c of 9.6 down from 10.4, but still not under good control. Will change lantus to xultophy and recheck 1 month. Call with any concerns.

## 2017-12-14 NOTE — Assessment & Plan Note (Signed)
Rechecking levels today. Refills given. Call with any concerns.  

## 2017-12-15 LAB — CBC WITH DIFFERENTIAL/PLATELET
Basophils Absolute: 0 10*3/uL (ref 0.0–0.2)
Basos: 0 %
EOS (ABSOLUTE): 0.1 10*3/uL (ref 0.0–0.4)
Eos: 1 %
Hematocrit: 38.7 % (ref 34.0–46.6)
Hemoglobin: 13 g/dL (ref 11.1–15.9)
Immature Grans (Abs): 0 10*3/uL (ref 0.0–0.1)
Immature Granulocytes: 0 %
Lymphocytes Absolute: 2.2 10*3/uL (ref 0.7–3.1)
Lymphs: 30 %
MCH: 31.2 pg (ref 26.6–33.0)
MCHC: 33.6 g/dL (ref 31.5–35.7)
MCV: 93 fL (ref 79–97)
Monocytes Absolute: 0.4 10*3/uL (ref 0.1–0.9)
Monocytes: 6 %
Neutrophils Absolute: 4.7 10*3/uL (ref 1.4–7.0)
Neutrophils: 63 %
Platelets: 269 10*3/uL (ref 150–379)
RBC: 4.17 x10E6/uL (ref 3.77–5.28)
RDW: 12.8 % (ref 12.3–15.4)
WBC: 7.5 10*3/uL (ref 3.4–10.8)

## 2017-12-15 LAB — COMPREHENSIVE METABOLIC PANEL
ALT: 18 IU/L (ref 0–32)
AST: 15 IU/L (ref 0–40)
Albumin/Globulin Ratio: 1.8 (ref 1.2–2.2)
Albumin: 4.6 g/dL (ref 3.5–5.5)
Alkaline Phosphatase: 66 IU/L (ref 39–117)
BUN/Creatinine Ratio: 30 — ABNORMAL HIGH (ref 9–23)
BUN: 15 mg/dL (ref 6–24)
Bilirubin Total: 0.5 mg/dL (ref 0.0–1.2)
CO2: 23 mmol/L (ref 20–29)
Calcium: 9.3 mg/dL (ref 8.7–10.2)
Chloride: 100 mmol/L (ref 96–106)
Creatinine, Ser: 0.5 mg/dL — ABNORMAL LOW (ref 0.57–1.00)
GFR calc Af Amer: 123 mL/min/{1.73_m2} (ref >59)
GFR calc non Af Amer: 106 mL/min/{1.73_m2} (ref >59)
Globulin, Total: 2.5 g/dL (ref 1.5–4.5)
Glucose: 274 mg/dL — ABNORMAL HIGH (ref 65–99)
Potassium: 4.2 mmol/L (ref 3.5–5.2)
Sodium: 139 mmol/L (ref 134–144)
Total Protein: 7.1 g/dL (ref 6.0–8.5)

## 2017-12-15 LAB — MICROALBUMIN, URINE WAIVED
Creatinine, Urine Waived: 200 mg/dL (ref 10–300)
Microalb, Ur Waived: 30 mg/L — ABNORMAL HIGH (ref 0–19)
Microalb/Creat Ratio: <30 mg/g (ref <30)

## 2017-12-15 LAB — LIPID PANEL W/O CHOL/HDL RATIO
Cholesterol, Total: 175 mg/dL (ref 100–199)
HDL: 40 mg/dL (ref >39)
LDL Calculated: 108 mg/dL — ABNORMAL HIGH (ref 0–99)
Triglycerides: 135 mg/dL (ref 0–149)
VLDL Cholesterol Cal: 27 mg/dL (ref 5–40)

## 2017-12-15 LAB — BAYER DCA HB A1C WAIVED: HB A1C (BAYER DCA - WAIVED): 9.6 % — ABNORMAL HIGH (ref <7.0)

## 2017-12-17 ENCOUNTER — Telehealth: Payer: Self-pay | Admitting: Family Medicine

## 2017-12-17 NOTE — Telephone Encounter (Signed)
Called to check in on how her sugars are doing. LMOM for her to call back. Will try her again tomorrow.

## 2017-12-17 NOTE — Telephone Encounter (Signed)
-----   Message from Dorcas CarrowMegan P Ottavio Norem, DO sent at 12/17/2017  9:18 AM EST ----- Call about her sugars.

## 2017-12-19 NOTE — Telephone Encounter (Signed)
Called to check on patient's sugars. She notes that she has been having bad headaches, but her sugars have been really good. Will give her another week on the medicine and see if it needs to be discontinued at that time.

## 2017-12-26 ENCOUNTER — Telehealth: Payer: Self-pay | Admitting: Family Medicine

## 2017-12-26 NOTE — Telephone Encounter (Signed)
Headaches have gotten better.   Did not take her medicine on Saturday, Sunday and Monday.    Sugars have been low in the AM on Sunday at 63. Yesterday AM sugars were a little lower. Has started working at night every other weekend. Sugars have not been low without taking it.   Will stop her glimiperide and continue the 16units of xultophy and recheck in about 1 week. OK to give her another sample of xultophy if she calls

## 2017-12-26 NOTE — Telephone Encounter (Signed)
-----   Message from Dorcas CarrowMegan P Allannah Kempen, DO sent at 12/19/2017 12:21 PM EST ----- Call about sugars and headaches on xultophy

## 2018-01-02 ENCOUNTER — Telehealth: Payer: Self-pay | Admitting: Family Medicine

## 2018-01-02 NOTE — Telephone Encounter (Signed)
-----   Message from Dorcas CarrowMegan P Johnson, DO sent at 12/26/2017 10:25 AM EST ----- Call about sugars on xultophy

## 2018-01-04 ENCOUNTER — Ambulatory Visit
Admission: RE | Admit: 2018-01-04 | Discharge: 2018-01-04 | Disposition: A | Discharge status: Home / Self Care | Payer: BLUE CROSS/BLUE SHIELD | Source: Ambulatory Visit | Attending: Obstetrics & Gynecology | Admitting: Obstetrics & Gynecology

## 2018-01-04 DIAGNOSIS — Z1231 Encounter for screening mammogram for malignant neoplasm of breast: Secondary | ICD-10-CM | POA: Diagnosis not present

## 2018-01-11 ENCOUNTER — Ambulatory Visit: Payer: BLUE CROSS/BLUE SHIELD | Admitting: Family Medicine

## 2018-01-11 ENCOUNTER — Encounter: Payer: Self-pay | Admitting: Family Medicine

## 2018-01-11 VITALS — BP 120/69 | HR 93 | Temp 99.1°F | Wt 126.5 lb

## 2018-01-11 DIAGNOSIS — E118 Type 2 diabetes mellitus with unspecified complications: Secondary | ICD-10-CM

## 2018-01-11 DIAGNOSIS — Z23 Encounter for immunization: Secondary | ICD-10-CM

## 2018-01-11 MED ORDER — INSULIN DEGLUDEC-LIRAGLUTIDE 100-3.6 UNIT-MG/ML ~~LOC~~ SOPN: 16.0000 [IU] | PEN_INJECTOR | Freq: Every day | SUBCUTANEOUS | 3 refills | Status: DC

## 2018-01-11 NOTE — Progress Notes (Signed)
BP 120/69 (BP Location: Left Arm, Patient Position: Sitting, Cuff Size: Normal)   Pulse 93   Temp 99.1 F (37.3 C)   Wt 126 lb 8 oz (57.4 kg)   SpO2 99%   BMI 23.14 kg/m    Subjective:    Patient ID: Brianna Leon, female    DOB: 1956-05-10, 62 y.o.   MRN: [id number]  HPI: Brianna Leon is a 62 y.o. female  Chief Complaint  Patient presents with  . Diabetes   DIABETES Hypoglycemic episodes:yes- a few, but not many, doing much better off the glipizide, usually doesn't eat and is upset that she has to eat now. Polydipsia/polyuria: no Visual disturbance: no Chest pain: no Paresthesias: no Glucose Monitoring: yes  Accucheck frequency: TID  Fasting glucose: 70s-80s             Checking in PM- 50s-60s- usually several hours after eating, eating primarily concentrated sugars Taking Insulin?: yes Blood Pressure Monitoring: not checking Retinal Examination: Up to Date Foot Exam: Up to Date Diabetic Education: Not Completed Pneumovax: Up to Date Influenza: Up to Date Aspirin: yes  Relevant past medical, surgical, family and social history reviewed and updated as indicated. Interim medical history since our last visit reviewed. Allergies and medications reviewed and updated.  Review of Systems  Constitutional: Negative.   Respiratory: Negative.   Cardiovascular: Negative.   Gastrointestinal: Positive for constipation and nausea. Negative for abdominal distention, abdominal pain, anal bleeding, blood in stool, diarrhea, rectal pain and vomiting.  Psychiatric/Behavioral: Negative.     Per HPI unless specifically indicated above     Objective:    BP 120/69 (BP Location: Left Arm, Patient Position: Sitting, Cuff Size: Normal)   Pulse 93   Temp 99.1 F (37.3 C)   Wt 126 lb 8 oz (57.4 kg)   SpO2 99%   BMI 23.14 kg/m   Wt Readings from Last 3 Encounters:  01/11/18 126 lb 8 oz (57.4 kg)  12/14/17 122 lb (55.3 kg)  10/03/17 127 lb (57.6 kg)    Physical Exam    Constitutional: She is oriented to person, place, and time. She appears well-developed and well-nourished. No distress.  HENT:  Head: Normocephalic and atraumatic.  Right Ear: Hearing normal.  Left Ear: Hearing normal.  Nose: Nose normal.  Eyes: Conjunctivae and lids are normal. Right eye exhibits no discharge. Left eye exhibits no discharge. No scleral icterus.  Cardiovascular: Normal rate, regular rhythm, normal heart sounds and intact distal pulses. Exam reveals no gallop and no friction rub.  No murmur heard. Pulmonary/Chest: Effort normal and breath sounds normal. No respiratory distress. She has no wheezes. She has no rales. She exhibits no tenderness.  Musculoskeletal: Normal range of motion.  Neurological: She is alert and oriented to person, place, and time.  Skin: Skin is warm, dry and intact. No rash noted. She is not diaphoretic. No erythema. No pallor.  Psychiatric: She has a normal mood and affect. Her speech is normal and behavior is normal. Judgment and thought content normal. Cognition and memory are normal.  Nursing note and vitals reviewed.   Results for orders placed or performed in visit on 12/14/17  Bayer DCA Hb A1c Waived  Result Value Ref Range   Bayer DCA Hb A1c Waived 9.6 (H) <7.0 %  CBC with Differential/Platelet  Result Value Ref Range   WBC 7.5 3.4 - 10.8 x10E3/uL   RBC 4.17 3.77 - 5.28 x10E6/uL   Hemoglobin 13.0 11.1 - 15.9 g/dL  Hematocrit 38.7 34.0 - 46.6 %   MCV 93 79 - 97 fL   MCH 31.2 26.6 - 33.0 pg   MCHC 33.6 31.5 - 35.7 g/dL   RDW 95.612.8 21.312.3 - 08.615.4 %   Platelets 269 150 - 379 x10E3/uL   Neutrophils 63 Not Estab. %   Lymphs 30 Not Estab. %   Monocytes 6 Not Estab. %   Eos 1 Not Estab. %   Basos 0 Not Estab. %   Neutrophils Absolute 4.7 1.4 - 7.0 x10E3/uL   Lymphocytes Absolute 2.2 0.7 - 3.1 x10E3/uL   Monocytes Absolute 0.4 0.1 - 0.9 x10E3/uL   EOS (ABSOLUTE) 0.1 0.0 - 0.4 x10E3/uL   Basophils Absolute 0.0 0.0 - 0.2 x10E3/uL   Immature  Granulocytes 0 Not Estab. %   Immature Grans (Abs) 0.0 0.0 - 0.1 x10E3/uL  Comprehensive metabolic panel  Result Value Ref Range   Glucose 274 (H) 65 - 99 mg/dL   BUN 15 6 - 24 mg/dL   Creatinine, Ser 5.780.50 (L) 0.57 - 1.00 mg/dL   GFR calc non Af Amer 106 >59 mL/min/1.73   GFR calc Af Amer 123 >59 mL/min/1.73   BUN/Creatinine Ratio 30 (H) 9 - 23   Sodium 139 134 - 144 mmol/L   Potassium 4.2 3.5 - 5.2 mmol/L   Chloride 100 96 - 106 mmol/L   CO2 23 20 - 29 mmol/L   Calcium 9.3 8.7 - 10.2 mg/dL   Total Protein 7.1 6.0 - 8.5 g/dL   Albumin 4.6 3.5 - 5.5 g/dL   Globulin, Total 2.5 1.5 - 4.5 g/dL   Albumin/Globulin Ratio 1.8 1.2 - 2.2   Bilirubin Total 0.5 0.0 - 1.2 mg/dL   Alkaline Phosphatase 66 39 - 117 IU/L   AST 15 0 - 40 IU/L   ALT 18 0 - 32 IU/L  Lipid Panel w/o Chol/HDL Ratio  Result Value Ref Range   Cholesterol, Total 175 100 - 199 mg/dL   Triglycerides 469135 0 - 149 mg/dL   HDL 40 >62>39 mg/dL   VLDL Cholesterol Cal 27 5 - 40 mg/dL   LDL Calculated 952108 (H) 0 - 99 mg/dL  Microalbumin, Urine Waived  Result Value Ref Range   Microalb, Ur Waived 30 (H) 0 - 19 mg/L   Creatinine, Urine Waived 200 10 - 300 mg/dL   Microalb/Creat Ratio <30 <30 mg/g      Assessment & Plan:   Problem List Items Addressed This Visit      Endocrine   DM (diabetes mellitus), type 2 (HCC) - Primary    Doing OK on the xultophy. Will try to drop to 14units and recheck 2 month. Call with any concerns.       Relevant Medications   Insulin Degludec-Liraglutide (XULTOPHY) 100-3.6 UNIT-MG/ML SOPN    Other Visit Diagnoses    Need for Tdap vaccination       Tdap given today.   Relevant Orders   Tdap vaccine greater than or equal to 7yo IM       Follow up plan: Return in about 2 months (around 03/13/2018) for DM visit.

## 2018-01-11 NOTE — Assessment & Plan Note (Signed)
Doing OK on the xultophy. Will try to drop to 14units and recheck 2 month. Call with any concerns.

## 2018-01-27 ENCOUNTER — Emergency Department
Admission: EM | Admit: 2018-01-27 | Discharge: 2018-01-27 | Disposition: A | Discharge status: Home / Self Care | Payer: BLUE CROSS/BLUE SHIELD | Attending: Student in an Organized Health Care Education/Training Program | Admitting: Student in an Organized Health Care Education/Training Program

## 2018-01-27 DIAGNOSIS — S01412A Laceration without foreign body of left cheek and temporomandibular area, initial encounter: Secondary | ICD-10-CM | POA: Diagnosis not present

## 2018-01-27 DIAGNOSIS — Y9389 Activity, other specified: Secondary | ICD-10-CM | POA: Diagnosis not present

## 2018-01-27 DIAGNOSIS — Z79899 Other long term (current) drug therapy: Secondary | ICD-10-CM | POA: Diagnosis not present

## 2018-01-27 DIAGNOSIS — E119 Type 2 diabetes mellitus without complications: Secondary | ICD-10-CM | POA: Insufficient documentation

## 2018-01-27 DIAGNOSIS — Y999 Unspecified external cause status: Secondary | ICD-10-CM | POA: Diagnosis not present

## 2018-01-27 DIAGNOSIS — I10 Essential (primary) hypertension: Secondary | ICD-10-CM | POA: Diagnosis not present

## 2018-01-27 DIAGNOSIS — Z7982 Long term (current) use of aspirin: Secondary | ICD-10-CM | POA: Insufficient documentation

## 2018-01-27 DIAGNOSIS — W01190A Fall on same level from slipping, tripping and stumbling with subsequent striking against furniture, initial encounter: Secondary | ICD-10-CM | POA: Diagnosis not present

## 2018-01-27 DIAGNOSIS — S0181XA Laceration without foreign body of other part of head, initial encounter: Secondary | ICD-10-CM

## 2018-01-27 DIAGNOSIS — Y9222 Religious institution as the place of occurrence of the external cause: Secondary | ICD-10-CM | POA: Diagnosis not present

## 2018-01-27 LAB — GLUCOSE, CAPILLARY: Glucose-Capillary: 296 mg/dL — ABNORMAL HIGH (ref 65–99)

## 2018-01-27 NOTE — ED Notes (Signed)
See triage note  Presents with superficial  Laceration to left cheek  States she thinks her knee gave out and she hit face on chair  Denies any loc  And denies any pain at present

## 2018-01-27 NOTE — ED Triage Notes (Signed)
Pt states she was at church and went to get her granddaughter out of the children's area.  She states when she started walking her leg gave way and she fell and hit her cheek.  Pt denies LOC.  Pt is A&Ox4, in NAD, ambulatory to triage.  Small lac noted to pt's L cheek.

## 2018-01-27 NOTE — ED Provider Notes (Signed)
[facility] Emergency Department Provider Note ____________________________________________  Time seen: 1224  I have reviewed the triage vital signs and the nursing notes.  HISTORY  Chief Complaint  Facial Laceration and Fall  HPI Brianna Leon is a 61 y.o. female presents to the ED following a mechanical fall at church this morning.  Patient describes her knee giving out as she stood up, causing her to fall and hit her left cheek.  Denies any preceding dizziness, syncope, or weakness.  She denies any loss of consciousness, nausea, vomiting, visual disturbance.  Patient presents now with small laceration to the cheek.  She is denying any other injury at this time.  Past Medical History:  Diagnosis Date  . Arthritis    KNEES  . CAD (coronary artery disease)   . DM (diabetes mellitus), type 2 ([place]) 2000   insulin started 2016  . Elevated liver enzymes   . Family history of colonic polyps    father  . GERD (gastroesophageal reflux disease)   . Hemorrhage    DURING PREGNANCY 31 YEARS AGO  . Hyperlipidemia   . IBS (irritable bowel syndrome)   . Migraine     Patient Active Problem List   Diagnosis Date Noted  . Biliary colic 56/43/3295  . RUQ pain 06/12/2017  . CAD (coronary artery disease)   . Hyperlipidemia   . Hypertension   . GERD (gastroesophageal reflux disease)   . DM (diabetes mellitus), type 2 ([place])     Past Surgical History:  Procedure Laterality Date  . ABDOMINAL HYSTERECTOMY  22 years ago  . CARDIAC CATHETERIZATION     x 2  . CARPAL TUNNEL RELEASE Right   . CHOLECYSTECTOMY N/A 09/20/2017   Procedure: LAPAROSCOPIC CHOLECYSTECTOMY WITH INTRAOPERATIVE CHOLANGIOGRAM;  Surgeon: Robert Bellow, MD;  Location: [facility] ORS;  Service: General;  Laterality: N/A;  . COLONOSCOPY  05/18/2015   repeat 5 years  . ESOPHAGOGASTRODUODENOSCOPY  05/18/2015  . KNEE ARTHROSCOPY  2001 and 2009  . TONSILLECTOMY      Prior to Admission medications    Medication Sig Start Date End Date Taking? Authorizing Provider  aspirin EC 81 MG tablet Take 81 mg by mouth daily.    [provider]  aspirin-acetaminophen-caffeine (EXCEDRIN MIGRAINE) [phone] MG tablet Take 2 tablets by mouth every 6 (six) hours as needed for headache.    [provider]  Aspirin-Caffeine (BAYER BACK & BODY) 500-32.5 MG TABS Take 2 tablets by mouth daily as needed (for pain).    [provider]  blood glucose meter kit and supplies KIT Dispense based on patient and insurance preference. Use up to four times daily as directed. (FOR ICD-9 250.00, 250.01). 10/24/16   Harvest Dark, MD  Insulin Degludec-Liraglutide (XULTOPHY) 100-3.6 UNIT-MG/ML SOPN Inject 16 Units into the skin daily. 01/11/18   Johnson, Megan P, DO  LORazepam (ATIVAN) 0.5 MG tablet Take 1 tablet (0.5 mg total) by mouth 2 (two) times daily as needed for anxiety. 12/14/17   Johnson, Megan P, DO  meclizine (ANTIVERT) 25 MG tablet Take 1 tablet (25 mg total) by mouth 3 (three) times daily as needed for dizziness. 10/24/16   Harvest Dark, MD  Multiple Vitamins-Minerals (OCUVITE ADULT 50+ PO) Take 1 capsule by mouth daily.    [provider]  omeprazole (PRILOSEC) 20 MG capsule Take 1 capsule (20 mg total) by mouth every morning. 12/14/17   Johnson, Megan P, DO  OVER THE COUNTER MEDICATION Take 2 capsules by mouth daily. Hearing Health  Supplement    [provider]  promethazine (PHENERGAN) 25 MG tablet Take 25 mg by mouth every 6 (six) hours as needed for nausea or vomiting.    [provider]  simvastatin (ZOCOR) 40 MG tablet Take 1 tablet (40 mg total) by mouth at bedtime. 12/14/17   Park Liter P, DO    Allergies Carafate [sucralfate]; Atorvastatin; Metformin and related; Other; Penicillins; and Tramadol  Family History  Problem Relation Age of Onset  . Colon polyps Father   . Diabetes Father   . Hypertension Father   . Heart disease Father   .  Stroke Father   . Diabetes Mother   . Hypertension Mother   . Diabetes Brother   . Heart failure Brother        pacemaker  . Hypertension Brother   . Heart attack Brother   . Migraines Daughter   . Lupus Son   . Diabetes Maternal Grandmother   . Heart disease Maternal Grandmother   . Stroke Maternal Grandfather   . Hypertension Paternal Grandmother   . Stroke Paternal Grandfather   . Hyperlipidemia Brother   . Breast cancer Neg Hx     Social History Social History   Tobacco Use  . Smoking status: Never Smoker  . Smokeless tobacco: Never Used  Substance Use Topics  . Alcohol use: No  . Drug use: No    Review of Systems  Constitutional: Negative for fever. Eyes: Negative for visual changes. ENT: Negative for sore throat. Cardiovascular: Negative for chest pain. Respiratory: Negative for shortness of breath. Gastrointestinal: Negative for abdominal pain, vomiting and diarrhea. Genitourinary: Negative for dysuria. Musculoskeletal: Negative for back pain. Skin: Negative for rash. Neurological: Negative for headaches, focal weakness or numbness. ____________________________________________  PHYSICAL EXAM:  VITAL SIGNS: ED Triage Vitals  Enc Vitals Group     BP 01/27/18 1204 (!) 153/74     Pulse Rate 01/27/18 1204 86     Resp 01/27/18 1204 18     Temp 01/27/18 1204 97.6 F (36.4 C)     Temp Source 01/27/18 1204 Oral     SpO2 01/27/18 1204 100 %     Weight 01/27/18 1205 126 lb (57.2 kg)     Height --      Head Circumference --      Peak Flow --      Pain Score 01/27/18 1205 5     Pain Loc --      Pain Edu? --      Excl. in [place]? --     Constitutional: Alert and oriented. Well appearing and in no distress. Head: Normocephalic and atraumatic, except for a superficial linear laceration in a horizontal lie just below the zygomatic arch on the left cheek. No active bleeding is noted.  The extends to the subcutaneous tissues with some extrusion of the subcu  fat. Eyes: Conjunctivae are normal. PERRL. Normal extraocular movements Ears: Canals clear. TMs intact bilaterally. Mouth/Throat: Mucous membranes are moist. Cardiovascular: Normal rate, regular rhythm. Normal distal pulses. Respiratory: Normal respiratory effort. No wheezes/rales/rhonchi. Musculoskeletal: Nontender with normal range of motion in all extremities.  Neurologic: Nerves II through XII grossly intact.  Normal gait without ataxia. Normal speech and language. No gross focal neurologic deficits are appreciated. Skin:  Skin is warm, dry and intact. No rash noted. Psychiatric: Mood and affect are normal. Patient exhibits appropriate insight and judgment. ____________________________________________  PROCEDURES  Labs Reviewed  GLUCOSE, CAPILLARY - Abnormal; Notable for the following components:  Result Value   Glucose-Capillary 296 (*)    All other components within normal limits  CBG MONITORING, ED  __________________________________________  .Marland KitchenLaceration Repair Date/Time: 01/27/2018 12:38 PM Performed by: Melvenia Needles, PA-C Authorized by: Melvenia Needles, PA-C   Consent:    Consent obtained:  Verbal   Consent given by:  Patient   Risks discussed:  Poor cosmetic result Anesthesia (see MAR for exact dosages):    Anesthesia method:  None Laceration details:    Location:  Face   Face location:  L cheek   Length (cm):  2 Repair type:    Repair type:  Simple Treatment:    Area cleansed with:  Saline   Amount of cleaning:  Standard Skin repair:    Repair method:  Tissue adhesive Approximation:    Approximation:  Close Post-procedure details:    Dressing:  Open (no dressing)   Patient tolerance of procedure:  Tolerated well, no immediate complications  ____________________________________________  INITIAL IMPRESSION / ASSESSMENT AND PLAN / ED COURSE  Patient with ED evaluation of a superficial facial laceration sustained after mechanical  fall discharge.  Patient denies any preceding syncope or dizziness.  She describes injury after her knee buckled.  She hit her face on a chair and sustained a laceration.  Wound is clean and repaired using wound adhesive.  Patient was discharged with wound care instructions, and will follow with her primary provider for ongoing symptoms. ____________________________________________  FINAL CLINICAL IMPRESSION(S) / ED DIAGNOSES  Final diagnoses:  Facial laceration, initial encounter      Melvenia Needles, PA-C 01/27/18 1243    Merlyn Lot, MD 01/27/18 [phone]

## 2018-01-27 NOTE — Discharge Instructions (Addendum)
Your superficial wound has been repaired using wound adhesive. Keep the area clean and dry. Follow-up with your provider as needed. Avoid any lotions, creams, or ointments to the wound area.

## 2018-03-15 ENCOUNTER — Ambulatory Visit
Admission: RE | Admit: 2018-03-15 | Discharge: 2018-03-15 | Disposition: A | Discharge status: Home / Self Care | Payer: BLUE CROSS/BLUE SHIELD | Source: Ambulatory Visit | Attending: Family Medicine | Admitting: Family Medicine

## 2018-03-15 ENCOUNTER — Telehealth: Payer: Self-pay | Admitting: Family Medicine

## 2018-03-15 ENCOUNTER — Ambulatory Visit: Payer: BLUE CROSS/BLUE SHIELD | Admitting: Family Medicine

## 2018-03-15 ENCOUNTER — Encounter: Payer: Self-pay | Admitting: Family Medicine

## 2018-03-15 VITALS — Wt 126.3 lb

## 2018-03-15 DIAGNOSIS — E118 Type 2 diabetes mellitus with unspecified complications: Secondary | ICD-10-CM | POA: Insufficient documentation

## 2018-03-15 DIAGNOSIS — S0990XA Unspecified injury of head, initial encounter: Secondary | ICD-10-CM

## 2018-03-15 DIAGNOSIS — R42 Dizziness and giddiness: Secondary | ICD-10-CM | POA: Insufficient documentation

## 2018-03-15 DIAGNOSIS — F419 Anxiety disorder, unspecified: Secondary | ICD-10-CM | POA: Diagnosis not present

## 2018-03-15 MED ORDER — INSULIN GLARGINE 100 UNIT/ML SOLOSTAR PEN: 20.0000 [IU] | PEN_INJECTOR | Freq: Every day | SUBCUTANEOUS | 1 refills | Status: DC

## 2018-03-15 MED ORDER — DULAGLUTIDE 0.75 MG/0.5ML ~~LOC~~ SOAJ: 0.7500 mg | SUBCUTANEOUS | 2 refills | Status: DC

## 2018-03-15 NOTE — Telephone Encounter (Signed)
Called patient with results. Normal. Hopefully dizziness is from stress and sugars. Will discuss further next week with changes in diabetes management. Call with any concerns.

## 2018-03-15 NOTE — Assessment & Plan Note (Signed)
Could not afford xultophy, not approved by her insurance. Will restart lantus and add trulicity. Stop glipizide as likely contributing to her hypoglycemia. Recheck on how her sugars are doing in 1 month.

## 2018-03-15 NOTE — Progress Notes (Signed)
Wt 126 lb 4.8 oz (57.3 kg)   SpO2 98%   BMI 23.10 kg/m    Subjective:    Patient ID: Brianna Leon, female    DOB: 1956/10/11, 62 y.o.   MRN: [id number]  HPI: Brianna Leon is a 62 y.o. female  Chief Complaint  Patient presents with  . Follow-up  . Diabetes  . Dizziness   Has been having a lot going on in the last 15 weeks. Her daughter was in and out of the hospital due to issues with her pregnancy. Her grandbaby was born 6 weeks early and was in the NICU. She was keeping her granddaughter. Has been under a huge amount of stress with her family. Passed out at church about 5 weeks ago. Went to the ER- cut her face, didn't tell them that she passed out. Her sister in law passed away and she is caring for her mother. She wasn't really having the swimming headedness before she hit her head  DIZZINESS Duration: 5 weeks Description of symptoms: lightheaded and off kilter Duration of episode: couple seconds to minutes Dizziness frequency: recurrent Provoking factors: unknown Aggravating factors:  unknown Triggered by rolling over in bed: no Triggered by bending over: yes Aggravated by head movement: no Aggravated by exertion, coughing, loud noises: no Recent head injury: yes Recent or current viral symptoms: no History of vasovagal episodes: yes Nausea: yes Vomiting: no Tinnitus: yes Hearing loss: yes Aural fullness: yes Headache: yes Photophobia/phonophobia: yes Unsteady gait: yes Postural instability: yes Diplopia, dysarthria, dysphagia or weakness: no Related to exertion: no Pallor: no Diaphoresis: no Dyspnea: no Chest pain: no  DIABETES- can't afford xultophy, has been taking her glimiperide and lantus Hypoglycemic episodes:yes Polydipsia/polyuria: yes Visual disturbance: yes Chest pain: no Paresthesias: no Glucose Monitoring: yes  Accucheck frequency: really up and down Taking Insulin?: yes Blood Pressure Monitoring: not checking Retinal Examination:  Up to Date Foot Exam: Up to Date Diabetic Education: Completed Pneumovax: Up to Date Influenza: Not up to Date Aspirin: no   Relevant past medical, surgical, family and social history reviewed and updated as indicated. Interim medical history since our last visit reviewed. Allergies and medications reviewed and updated.  Review of Systems  Constitutional: Positive for fatigue. Negative for activity change, appetite change, chills, diaphoresis, fever and unexpected weight change.  HENT: Negative.   Eyes: Negative.   Respiratory: Negative.   Cardiovascular: Negative.   Skin: Negative.   Neurological: Positive for dizziness, syncope, weakness, light-headedness and headaches. Negative for tremors, seizures, facial asymmetry, speech difficulty and numbness.  Psychiatric/Behavioral: Positive for sleep disturbance. Negative for agitation, behavioral problems, confusion, decreased concentration, dysphoric mood, hallucinations, self-injury and suicidal ideas. The patient is nervous/anxious. The patient is not hyperactive.     Per HPI unless specifically indicated above     Objective:    Wt 126 lb 4.8 oz (57.3 kg)   SpO2 98%   BMI 23.10 kg/m   Wt Readings from Last 3 Encounters:  03/15/18 126 lb 4.8 oz (57.3 kg)  01/27/18 126 lb (57.2 kg)  01/11/18 126 lb 8 oz (57.4 kg)  No data found.   Physical Exam  Constitutional: She is oriented to person, place, and time. She appears well-developed and well-nourished. No distress.  HENT:  Head: Normocephalic and atraumatic.  Right Ear: Hearing and external ear normal.  Left Ear: Hearing and external ear normal.  Nose: Nose normal.  Mouth/Throat: Oropharynx is clear and moist. No oropharyngeal exudate.  Eyes: Pupils are equal, round,  and reactive to light. Conjunctivae, EOM and lids are normal. Right eye exhibits no discharge. Left eye exhibits no discharge. No scleral icterus. Right eye exhibits normal extraocular motion and no nystagmus.  Left eye exhibits normal extraocular motion and no nystagmus.  Cardiovascular: Normal rate, regular rhythm, normal heart sounds and intact distal pulses. Exam reveals no gallop and no friction rub.  No murmur heard. Pulmonary/Chest: Effort normal and breath sounds normal. No stridor. No respiratory distress. She has no wheezes. She has no rales. She exhibits no tenderness.  Musculoskeletal: Normal range of motion. She exhibits no edema, tenderness or deformity.  Neurological: She is alert and oriented to person, place, and time. She displays normal reflexes. No cranial nerve deficit or sensory deficit. She exhibits normal muscle tone. Coordination abnormal.  Skin: Skin is warm, dry and intact. Capillary refill takes less than 2 seconds. No rash noted. She is not diaphoretic. No erythema. No pallor.  Psychiatric: She has a normal mood and affect. Her speech is normal and behavior is normal. Judgment and thought content normal. Cognition and memory are normal.  Nursing note and vitals reviewed.   Results for orders placed or performed during the hospital encounter of 01/27/18  Glucose, capillary  Result Value Ref Range   Glucose-Capillary 296 (H) 65 - 99 mg/dL      Assessment & Plan:   Problem List Items Addressed This Visit      Endocrine   DM (diabetes mellitus), type 2 (HCC)    Could not afford xultophy, not approved by her insurance. Will restart lantus and add trulicity. Stop glipizide as likely contributing to her hypoglycemia. Recheck on how her sugars are doing in 1 month.       Relevant Medications   Insulin Glargine (LANTUS SOLOSTAR) 100 UNIT/ML Solostar Pen   Dulaglutide (TRULICITY) 0.75 MG/0.5ML SOPN   Other Relevant Orders   Bayer DCA Hb A1c Waived   Comprehensive metabolic panel   CT Head Wo Contrast     Other   Dizziness    Of unclear etiology. Hit head before the dizziness began. Will check CT of the head- ordered today. Also changed her diabetic medicine without  discussion with provider. Has been under a huge amount of stress. Will get her off her glipizide and back on lantus and start trulicity. Will check CMP. CT head. No nystagmus. No Await results and follow up next week. Call with any concerns.       Relevant Orders   CT Head Wo Contrast    Other Visit Diagnoses    Traumatic injury of head, initial encounter    -  Primary   Normal neurologic exam, but unexamined head trauma. Will obtain CT head. Await results. Call with any concerns.    Acute anxiety       Not under good control. Will discuss further at follow up next week.       Follow up plan: Return 1 week.

## 2018-03-15 NOTE — Assessment & Plan Note (Addendum)
Of unclear etiology. Hit head before the dizziness began. Will check CT of the head- ordered today. Also changed her diabetic medicine without discussion with provider. Has been under a huge amount of stress. Will get her off her glipizide and back on lantus and start trulicity. Will check CMP. CT head. No nystagmus. No Await results and follow up next week. Call with any concerns.

## 2018-03-16 LAB — COMPREHENSIVE METABOLIC PANEL
ALT: 18 IU/L (ref 0–32)
AST: 16 IU/L (ref 0–40)
Albumin/Globulin Ratio: 1.8 (ref 1.2–2.2)
Albumin: 4.5 g/dL (ref 3.6–4.8)
Alkaline Phosphatase: 71 IU/L (ref 39–117)
BUN/Creatinine Ratio: 31 — ABNORMAL HIGH (ref 12–28)
BUN: 18 mg/dL (ref 8–27)
Bilirubin Total: 0.3 mg/dL (ref 0.0–1.2)
CO2: 24 mmol/L (ref 20–29)
Calcium: 9.7 mg/dL (ref 8.7–10.3)
Chloride: 101 mmol/L (ref 96–106)
Creatinine, Ser: 0.58 mg/dL (ref 0.57–1.00)
GFR calc Af Amer: 116 mL/min/{1.73_m2} (ref >59)
GFR calc non Af Amer: 101 mL/min/{1.73_m2} (ref >59)
Globulin, Total: 2.5 g/dL (ref 1.5–4.5)
Glucose: 204 mg/dL — ABNORMAL HIGH (ref 65–99)
Potassium: 4.1 mmol/L (ref 3.5–5.2)
Sodium: 141 mmol/L (ref 134–144)
Total Protein: 7 g/dL (ref 6.0–8.5)

## 2018-03-16 LAB — BAYER DCA HB A1C WAIVED: HB A1C (BAYER DCA - WAIVED): 9 % — ABNORMAL HIGH (ref <7.0)

## 2018-03-22 ENCOUNTER — Ambulatory Visit: Payer: BLUE CROSS/BLUE SHIELD | Admitting: Family Medicine

## 2018-03-22 ENCOUNTER — Encounter: Payer: Self-pay | Admitting: Family Medicine

## 2018-03-22 VITALS — BP 134/81 | HR 88 | Temp 98.2°F | Wt 126.1 lb

## 2018-03-22 DIAGNOSIS — R55 Syncope and collapse: Secondary | ICD-10-CM

## 2018-03-22 DIAGNOSIS — R42 Dizziness and giddiness: Secondary | ICD-10-CM | POA: Diagnosis not present

## 2018-03-22 MED ORDER — ESCITALOPRAM OXALATE 10 MG PO TABS: ORAL_TABLET | ORAL | 3 refills | Status: DC

## 2018-03-22 NOTE — Progress Notes (Signed)
BP 134/81 (BP Location: Left Arm, Patient Position: Sitting, Cuff Size: Normal)   Pulse 88   Temp 98.2 F (36.8 C)   Wt 126 lb 1 oz (57.2 kg)   SpO2 99%   BMI 23.06 kg/m    Subjective:    Patient ID: Brianna Leon, female    DOB: [birth year], 62 y.o.   MRN: [id number]  HPI: Brianna Leon is a 62 y.o. female  Chief Complaint  Patient presents with  . Dizziness   DIZZINESS- feeling better. She notes that she is less dizzy now than she was last week. She notes that she is under a huge amount of stress. She is starting to notice that the more stress she is feeling, the worse she is feeling and the more dizzy she is  Duration: 6 weeks Description of symptoms: lightheaded and off kilter Duration of episode: seconds to minutes Dizziness frequency: recurrent Provoking factors: ? stress Aggravating factors:  ? stress Triggered by rolling over in bed: no Triggered by bending over: yes Aggravated by head movement: no Aggravated by exertion, coughing, loud noises: no Recent head injury: yes Recent or current viral symptoms: no History of vasovagal episodes: yes Nausea: yes Vomiting: no Tinnitus: yes Hearing loss: yes Aural fullness: yes Headache: yes Photophobia/phonophobia: yes Unsteady gait: yes Postural instability: yes Diplopia, dysarthria, dysphagia or weakness: no Related to exertion: no Pallor: no Diaphoresis: no Dyspnea: no Chest pain: no  ANXIETY/STRESS- under huge amount of stress with her family Duration:exacerbated Anxious mood: yes  Excessive worrying: yes Irritability: yes  Sweating: no Nausea: yes Palpitations:yes Hyperventilation: no Panic attacks: no Agoraphobia: no  Obscessions/compulsions: no Depressed mood: yes Depression screen [facility] 2/9 03/22/2018 06/11/2017  Decreased Interest 0 0  Down, Depressed, Hopeless 0 0  PHQ - 2 Score 0 0  Altered sleeping 2 -  Tired, decreased energy 1 -  Change in appetite 2 -  Feeling bad or failure about  yourself  0 -  Trouble concentrating 0 -  Moving slowly or fidgety/restless 0 -  Suicidal thoughts 0 -  PHQ-9 Score 5 -   GAD 7 : Generalized Anxiety Score 03/22/2018  Nervous, Anxious, on Edge 2  Control/stop worrying 1  Worry too much - different things 2  Trouble relaxing 1  Restless 0  Easily annoyed or irritable 2  Afraid - awful might happen 0  Total GAD 7 Score 8  Anxiety Difficulty Somewhat difficult   Anhedonia: no Weight changes: no Insomnia: no   Hypersomnia: no Fatigue/loss of energy: yes Feelings of worthlessness: yes Feelings of guilt: yes Impaired concentration/indecisiveness: no Suicidal ideations: no  Crying spells: yes Recent Stressors/Life Changes: yes   Relationship problems: no   Family stress: yes     Financial stress: no    Job stress: yes    Recent death/loss: yes   Relevant past medical, surgical, family and social history reviewed and updated as indicated. Interim medical history since our last visit reviewed. Allergies and medications reviewed and updated.  Review of Systems  Constitutional: Negative.   Respiratory: Negative.   Cardiovascular: Negative.   Neurological: Positive for dizziness and light-headedness. Negative for tremors, seizures, syncope, facial asymmetry, speech difficulty, weakness, numbness and headaches.  Hematological: Negative.   Psychiatric/Behavioral: Positive for dysphoric mood. Negative for agitation, behavioral problems, confusion, decreased concentration, hallucinations, self-injury, sleep disturbance and suicidal ideas. The patient is nervous/anxious. The patient is not hyperactive.     Per HPI unless specifically indicated above     Objective:  BP 134/81 (BP Location: Left Arm, Patient Position: Sitting, Cuff Size: Normal)   Pulse 88   Temp 98.2 F (36.8 C)   Wt 126 lb 1 oz (57.2 kg)   SpO2 99%   BMI 23.06 kg/m   Wt Readings from Last 3 Encounters:  03/22/18 126 lb 1 oz (57.2 kg)  03/15/18 126 lb  4.8 oz (57.3 kg)  01/27/18 126 lb (57.2 kg)    Physical Exam  Constitutional: She is oriented to person, place, and time. She appears well-developed and well-nourished. No distress.  HENT:  Head: Normocephalic and atraumatic.  Right Ear: Hearing normal.  Left Ear: Hearing normal.  Nose: Nose normal.  Eyes: Conjunctivae and lids are normal. Right eye exhibits no discharge. Left eye exhibits no discharge. No scleral icterus.  Cardiovascular: Normal rate, regular rhythm, normal heart sounds and intact distal pulses. Exam reveals no gallop and no friction rub.  No murmur heard. Pulmonary/Chest: Effort normal and breath sounds normal. No stridor. No respiratory distress. She has no wheezes. She has no rales. She exhibits no tenderness.  Musculoskeletal: Normal range of motion.  Neurological: She is alert and oriented to person, place, and time.  Skin: Skin is warm, dry and intact. Capillary refill takes less than 2 seconds. No rash noted. She is not diaphoretic. No erythema. No pallor.  Psychiatric: She has a normal mood and affect. Her speech is normal and behavior is normal. Judgment and thought content normal. Cognition and memory are normal.  Nursing note and vitals reviewed.   Results for orders placed or performed in visit on 03/15/18  Bayer DCA Hb A1c Waived  Result Value Ref Range   Bayer DCA Hb A1c Waived 9.0 (H) <7.0 %  Comprehensive metabolic panel  Result Value Ref Range   Glucose 204 (H) 65 - 99 mg/dL   BUN 18 8 - 27 mg/dL   Creatinine, Ser 6.96 0.57 - 1.00 mg/dL   GFR calc non Af Amer 101 >59 mL/min/1.73   GFR calc Af Amer 116 >59 mL/min/1.73   BUN/Creatinine Ratio 31 (H) 12 - 28   Sodium 141 134 - 144 mmol/L   Potassium 4.1 3.5 - 5.2 mmol/L   Chloride 101 96 - 106 mmol/L   CO2 24 20 - 29 mmol/L   Calcium 9.7 8.7 - 10.3 mg/dL   Total Protein 7.0 6.0 - 8.5 g/dL   Albumin 4.5 3.6 - 4.8 g/dL   Globulin, Total 2.5 1.5 - 4.5 g/dL   Albumin/Globulin Ratio 1.8 1.2 - 2.2    Bilirubin Total 0.3 0.0 - 1.2 mg/dL   Alkaline Phosphatase 71 39 - 117 IU/L   AST 16 0 - 40 IU/L   ALT 18 0 - 32 IU/L      Assessment & Plan:   Problem List Items Addressed This Visit      Other   Dizziness - Primary    Still likely multi-factorial. Will treat anxiety and will try to get sugars under better control. Recheck 3 weeks. Call with any concerns.        Other Visit Diagnoses    Syncope, unspecified syncope type       She did not have evaluation with this. Will get her back into her cardiologist for evaluation. Call with any concerns.        Follow up plan: Return in about 3 weeks (around 04/12/2018).

## 2018-03-22 NOTE — Assessment & Plan Note (Signed)
Still likely multi-factorial. Will treat anxiety and will try to get sugars under better control. Recheck 3 weeks. Call with any concerns.

## 2018-04-05 DIAGNOSIS — G43909 Migraine, unspecified, not intractable, without status migrainosus: Secondary | ICD-10-CM | POA: Diagnosis not present

## 2018-04-05 DIAGNOSIS — R0602 Shortness of breath: Secondary | ICD-10-CM | POA: Diagnosis not present

## 2018-04-05 DIAGNOSIS — I208 Other forms of angina pectoris: Secondary | ICD-10-CM | POA: Diagnosis not present

## 2018-04-05 DIAGNOSIS — R55 Syncope and collapse: Secondary | ICD-10-CM | POA: Diagnosis not present

## 2018-04-12 ENCOUNTER — Ambulatory Visit: Payer: BLUE CROSS/BLUE SHIELD | Admitting: Family Medicine

## 2018-04-19 DIAGNOSIS — I6522 Occlusion and stenosis of left carotid artery: Secondary | ICD-10-CM | POA: Diagnosis not present

## 2018-04-19 DIAGNOSIS — R55 Syncope and collapse: Secondary | ICD-10-CM | POA: Diagnosis not present

## 2018-04-19 DIAGNOSIS — R0602 Shortness of breath: Secondary | ICD-10-CM | POA: Diagnosis not present

## 2018-04-19 DIAGNOSIS — I208 Other forms of angina pectoris: Secondary | ICD-10-CM | POA: Diagnosis not present

## 2018-04-23 DIAGNOSIS — R55 Syncope and collapse: Secondary | ICD-10-CM | POA: Diagnosis not present

## 2018-04-26 DIAGNOSIS — G43909 Migraine, unspecified, not intractable, without status migrainosus: Secondary | ICD-10-CM | POA: Diagnosis not present

## 2018-04-26 DIAGNOSIS — R0602 Shortness of breath: Secondary | ICD-10-CM | POA: Diagnosis not present

## 2018-04-26 DIAGNOSIS — I208 Other forms of angina pectoris: Secondary | ICD-10-CM | POA: Diagnosis not present

## 2018-04-26 DIAGNOSIS — R55 Syncope and collapse: Secondary | ICD-10-CM | POA: Diagnosis not present

## 2018-05-17 ENCOUNTER — Ambulatory Visit: Payer: BLUE CROSS/BLUE SHIELD | Admitting: Family Medicine

## 2018-05-17 ENCOUNTER — Encounter: Payer: Self-pay | Admitting: Family Medicine

## 2018-05-17 ENCOUNTER — Other Ambulatory Visit: Payer: Self-pay

## 2018-05-17 VITALS — BP 135/78 | HR 91 | Temp 98.6°F | Ht 63.0 in | Wt 128.1 lb

## 2018-05-17 DIAGNOSIS — F419 Anxiety disorder, unspecified: Secondary | ICD-10-CM | POA: Insufficient documentation

## 2018-05-17 DIAGNOSIS — R42 Dizziness and giddiness: Secondary | ICD-10-CM

## 2018-05-17 DIAGNOSIS — F5104 Psychophysiologic insomnia: Secondary | ICD-10-CM | POA: Diagnosis not present

## 2018-05-17 MED ORDER — TRAZODONE HCL 50 MG PO TABS: 25.0000 mg | ORAL_TABLET | Freq: Every evening | ORAL | 3 refills | Status: DC | PRN

## 2018-05-17 MED ORDER — LORAZEPAM 0.5 MG PO TABS: 0.5000 mg | ORAL_TABLET | Freq: Two times a day (BID) | ORAL | 0 refills | Status: DC | PRN

## 2018-05-17 MED ORDER — ESCITALOPRAM OXALATE 20 MG PO TABS: 20.0000 mg | ORAL_TABLET | Freq: Every day | ORAL | 3 refills | Status: DC

## 2018-05-17 NOTE — Assessment & Plan Note (Signed)
Doing better on lexapro, but not quite there. Will increase to 20mg  daily and recheck 1 month at DM visit. Refill of ativan given today to get her until that appointment. Call with any concerns.

## 2018-05-17 NOTE — Progress Notes (Signed)
BP 135/78   Pulse 91   Temp 98.6 F (37 C) (Oral)   Ht 5\' 3"  (1.6 m)   Wt 128 lb 2 oz (58.1 kg)   SpO2 97%   BMI 22.70 kg/m    Subjective:    Patient ID: Geanie Loganheryl G Liwanag, female    DOB: Oct 08, 1956, 62 y.o.   MRN: [id number]  HPI: Geanie LoganCheryl G Limones is a 62 y.o. female  Chief Complaint  Patient presents with  . Anxiety   Here today for follow up on her dizziness and syncope. Has seen her cardiologist 2x since then. Had Stress test, carotid US, 48 hour holter and an ECHO. All were normal. She has not had any issues with syncope or feeling dizzy since then. Feeling back to herself and again caring for children. Holter also came back normal. She is not feeling dizzy any more.   ANXIETY/STRESS- has been feeling a little better since starting her lexapro. She is out of the ativan. She notes that she continues with a lot of problems with her family. She also notes that she has trouble sleeping.  Duration:better- but definitely not there Anxious mood: yes  Excessive worrying: yes Irritability: yes  Sweating: no Nausea: no Palpitations:yes Hyperventilation: no Panic attacks: no Agoraphobia: no  Obscessions/compulsions: no Depressed mood: yes [facility] 05/17/2018 03/22/2018 06/11/2017  Decreased Interest 0 0 0  Down, Depressed, Hopeless 0 0 0  PHQ - 2 Score 0 0 0  Altered sleeping 2 2 -  Tired, decreased energy 1 1 -  Change in appetite 1 2 -  Feeling bad or failure about yourself  0 0 -  Trouble concentrating 0 0 -  Moving slowly or fidgety/restless 0 0 -  Suicidal thoughts 0 0 -  PHQ-9 Score 4 5 -  Difficult doing work/chores Somewhat difficult - -   GAD 7 : Generalized Anxiety Score 05/17/2018 03/22/2018  Nervous, Anxious, on Edge 1 2  Control/stop worrying 1 1  Worry too much - different things 1 2  Trouble relaxing 1 1  Restless 0 0  Easily annoyed or irritable 1 2  Afraid - awful might happen 1 0  Total GAD 7 Score 6 8  Anxiety Difficulty Somewhat  difficult Somewhat difficult   Anhedonia: no Weight changes: no Insomnia: yes hard to fall asleep  Hypersomnia: no Fatigue/loss of energy: yes Feelings of worthlessness: no Feelings of guilt: no Impaired concentration/indecisiveness: no Suicidal ideations: no  Crying spells: no Recent Stressors/Life Changes: yes   Relationship problems: no   Family stress: yes     Financial stress: yes    Job stress: yes    Recent death/loss: yes   Relevant past medical, surgical, family and social history reviewed and updated as indicated. Interim medical history since our last visit reviewed. Allergies and medications reviewed and updated.  Review of Systems  Constitutional: Negative.   Respiratory: Negative.   Cardiovascular: Negative.   Musculoskeletal: Negative.   Skin: Negative.   Neurological: Positive for light-headedness. Negative for dizziness, tremors, syncope, facial asymmetry, speech difficulty, weakness, numbness and headaches.  Psychiatric/Behavioral: Positive for sleep disturbance. Negative for agitation, behavioral problems, confusion, decreased concentration, dysphoric mood, hallucinations, self-injury and suicidal ideas. The patient is nervous/anxious. The patient is not hyperactive.     Per HPI unless specifically indicated above     Objective:    BP 135/78   Pulse 91   Temp 98.6 F (37 C) (Oral)   Ht 5\' 3"  (1.6 m)  Wt 128 lb 2 oz (58.1 kg)   SpO2 97%   BMI 22.70 kg/m   Wt Readings from Last 3 Encounters:  05/17/18 128 lb 2 oz (58.1 kg)  03/22/18 126 lb 1 oz (57.2 kg)  03/15/18 126 lb 4.8 oz (57.3 kg)    Physical Exam  Constitutional: She is oriented to person, place, and time. She appears well-developed and well-nourished. No distress.  HENT:  Head: Normocephalic and atraumatic.  Right Ear: Hearing normal.  Left Ear: Hearing normal.  Nose: Nose normal.  Eyes: Conjunctivae and lids are normal. Right eye exhibits no discharge. Left eye exhibits no  discharge. No scleral icterus.  Cardiovascular: Normal rate, regular rhythm, normal heart sounds and intact distal pulses. Exam reveals no gallop and no friction rub.  No murmur heard. Pulmonary/Chest: Effort normal and breath sounds normal. No stridor. No respiratory distress. She has no wheezes. She has no rales. She exhibits no tenderness.  Musculoskeletal: Normal range of motion.  Neurological: She is alert and oriented to person, place, and time.  Skin: Skin is warm, dry and intact. Capillary refill takes less than 2 seconds. No rash noted. She is not diaphoretic. No erythema. No pallor.  Psychiatric: She has a normal mood and affect. Her speech is normal and behavior is normal. Judgment and thought content normal. Cognition and memory are normal.  Nursing note and vitals reviewed.  ECHO:  INTERPRETATION  NORMAL LEFT VENTRICULAR SYSTOLIC FUNCTION WITH MILD LVH  NORMAL RIGHT VENTRICULAR SYSTOLIC FUNCTION  MILD VALVULAR REGURGITATION (See above)  NO VALVULAR STENOSIS  MILD MR, TR  EF 55%   Procedure: Pharmacologic Myocardial Perfusion Imaging One day procedure  Indication: SOB (shortness of breath) Plan: NM myocardial perfusion SPECT multiple (stress  and rest), ECG stress test only  Equivalent angina (CMS-HCC) Plan: NM myocardial perfusion SPECT multiple (stress  and rest), ECG stress test only  Syncope, unspecified syncope type Plan: NM myocardial perfusion SPECT multiple (stress  and rest), ECG stress test only  Ordering Physician:   Dr. Dorothyann Peng   Clinical History: 62 y.o. year old female recent anginal symptoms Vitals: Height: 63 in Weight: 126 lb Cardiac risk factors include:  Diabetes and HTN    Procedure:  Pharmacologic stress testing was performed with Regadenoson using a single  use 0.4mg /57ml (0.08 mg/ml) prefilled syringe intravenously infused as a  bolus dose. The stress test was stopped due to Infusion  completion.Blood  pressure response was normal. The patient did not develop any symptoms  other than fatigue during the procedure.   Rest HR: 78bpm Rest BP: 130 Max HR: 108bpm Min BP: 130  Stress Test Administered by: Benny Lennert CMA  ECG Interpretation: Rest ZOX:WRUEAV sinus rhythm, none Stress WUJ:WJXBJY sinus rhythm,  Recovery NWG:NFAOZH sinus rhythm ECG Interpretation:non-diagnostic due to pharmacologic testing.   Administrations This Visit regadenoson (LEXISCAN) 0.4 mg/5 mL inj syringe 0.4 mg Admin Date 04/19/2018 Action Given Dose 0.4 mg Route Intravenous Administered By Titus Dubin, CNMT    technetium Tc60m sestamibi (CARDIOLITE) injection 11.97 millicurie Admin Date 04/19/2018 Action Given Dose 11.97 millicurie Route Intravenous Administered By Titus Dubin, CNMT    technetium Tc58m sestamibi (CARDIOLITE) injection 29.52 millicurie Admin Date 04/19/2018 Action Given Dose 29.52 millicurie Route Intravenous Administered By Titus Dubin, CNMT   Gated post-stress perfusion imaging was performed 30 minutes after stress.  Rest images were performed 30 minutes after injection.  Gated LV Analysis:  TID Ratio: 1.32  LVEF= 70 %  FINDINGS: Regional wall  motion:reveals normal myocardial thickening and wall  motion. The overall quality of the study is good. Artifacts noted: no Left ventricular cavity: normal.  Perfusion Analysis:SPECT images demonstrate homogeneous tracer  distribution throughout the myocardium.   Results for orders placed or performed in visit on 03/15/18  Bayer DCA Hb A1c Waived  Result Value Ref Range   HB A1C (BAYER DCA - WAIVED) 9.0 (H) <7.0 %  Comprehensive metabolic panel  Result Value Ref Range   Glucose 204 (H) 65 - 99 mg/dL   BUN 18 8 - 27 mg/dL   Creatinine, Ser 6.96 0.57 - 1.00 mg/dL   GFR calc non Af Amer 101 >59 mL/min/1.73   GFR calc Af Amer 116 >59 mL/min/1.73    BUN/Creatinine Ratio 31 (H) 12 - 28   Sodium 141 134 - 144 mmol/L   Potassium 4.1 3.5 - 5.2 mmol/L   Chloride 101 96 - 106 mmol/L   CO2 24 20 - 29 mmol/L   Calcium 9.7 8.7 - 10.3 mg/dL   Total Protein 7.0 6.0 - 8.5 g/dL   Albumin 4.5 3.6 - 4.8 g/dL   Globulin, Total 2.5 1.5 - 4.5 g/dL   Albumin/Globulin Ratio 1.8 1.2 - 2.2   Bilirubin Total 0.3 0.0 - 1.2 mg/dL   Alkaline Phosphatase 71 39 - 117 IU/L   AST 16 0 - 40 IU/L   ALT 18 0 - 32 IU/L      Assessment & Plan:   Problem List Items Addressed This Visit      Other   Dizziness   Anxiety - Primary    Doing better on lexapro, but not quite there. Will increase to 20mg  daily and recheck 1 month at DM visit. Refill of ativan given today to get her until that appointment. Call with any concerns.       Relevant Medications   traZODone (DESYREL) 50 MG tablet   escitalopram (LEXAPRO) 20 MG tablet   LORazepam (ATIVAN) 0.5 MG tablet   Psychophysiological insomnia    Will treat with trazodone. Rx given today. Follow up 1 month. Call with any concerns.           Follow up plan: Return in about 1 month (around 06/14/2018) for Follow up DM and mood.

## 2018-05-17 NOTE — Assessment & Plan Note (Signed)
Will treat with trazodone. Rx given today. Follow up 1 month. Call with any concerns.

## 2018-07-05 ENCOUNTER — Ambulatory Visit: Payer: BLUE CROSS/BLUE SHIELD | Admitting: Family Medicine

## 2018-07-30 ENCOUNTER — Encounter: Payer: Self-pay | Admitting: Emergency Medicine

## 2018-07-30 ENCOUNTER — Other Ambulatory Visit: Payer: Self-pay

## 2018-07-30 ENCOUNTER — Emergency Department
Admission: EM | Admit: 2018-07-30 | Discharge: 2018-07-30 | Disposition: A | Discharge status: Home / Self Care | Payer: BLUE CROSS/BLUE SHIELD | Attending: Emergency Medicine | Admitting: Emergency Medicine

## 2018-07-30 DIAGNOSIS — E119 Type 2 diabetes mellitus without complications: Secondary | ICD-10-CM | POA: Diagnosis not present

## 2018-07-30 DIAGNOSIS — Z794 Long term (current) use of insulin: Secondary | ICD-10-CM | POA: Diagnosis not present

## 2018-07-30 DIAGNOSIS — Z79899 Other long term (current) drug therapy: Secondary | ICD-10-CM | POA: Diagnosis not present

## 2018-07-30 DIAGNOSIS — Z7982 Long term (current) use of aspirin: Secondary | ICD-10-CM | POA: Diagnosis not present

## 2018-07-30 DIAGNOSIS — L509 Urticaria, unspecified: Secondary | ICD-10-CM | POA: Insufficient documentation

## 2018-07-30 DIAGNOSIS — I2510 Atherosclerotic heart disease of native coronary artery without angina pectoris: Secondary | ICD-10-CM | POA: Diagnosis not present

## 2018-07-30 DIAGNOSIS — T7840XA Allergy, unspecified, initial encounter: Secondary | ICD-10-CM | POA: Diagnosis not present

## 2018-07-30 DIAGNOSIS — R1110 Vomiting, unspecified: Secondary | ICD-10-CM | POA: Insufficient documentation

## 2018-07-30 DIAGNOSIS — L299 Pruritus, unspecified: Secondary | ICD-10-CM | POA: Diagnosis present

## 2018-07-30 MED ORDER — PREDNISONE 20 MG PO TABS: 40.0000 mg | ORAL_TABLET | Freq: Every day | ORAL | 0 refills | Status: AC

## 2018-07-30 MED ORDER — EPINEPHRINE 0.3 MG/0.3ML IJ SOAJ: 0.3000 mg | Freq: Once | INTRAMUSCULAR | 1 refills | Status: AC

## 2018-07-30 MED ORDER — DIPHENHYDRAMINE HCL 50 MG/ML IJ SOLN
25.0000 mg | Freq: Once | INTRAMUSCULAR | Status: AC
  Administered 2018-07-30: 25 mg via INTRAVENOUS
  Filled 2018-07-30: qty 1

## 2018-07-30 MED ORDER — METHYLPREDNISOLONE SODIUM SUCC 125 MG IJ SOLR
125.0000 mg | Freq: Once | INTRAMUSCULAR | Status: AC
  Administered 2018-07-30: 125 mg via INTRAVENOUS
  Filled 2018-07-30: qty 2

## 2018-07-30 MED ORDER — FAMOTIDINE IN NACL 20-0.9 MG/50ML-% IV SOLN
20.0000 mg | Freq: Once | INTRAVENOUS | Status: AC
  Administered 2018-07-30: 20 mg via INTRAVENOUS
  Filled 2018-07-30: qty 50

## 2018-07-30 NOTE — ED Notes (Signed)
AaoX3.  SKIN WARM AND DRY.  nad 

## 2018-07-30 NOTE — ED Triage Notes (Signed)
Patient ambulatory to triage with steady gait, without difficulty or distress noted; pt reports that she awoke with hives, itching, no known cause; attempted to take benadryl twice but vomited both times

## 2018-07-30 NOTE — ED Notes (Addendum)
Hot red itching splotchy rash with some hives covering back, abdomen, scattered across arms and legs. RR effort WNL, although anxious

## 2018-07-30 NOTE — ED Notes (Signed)
Pt c/o resolving, states feeling better, vss, nad

## 2018-07-30 NOTE — ED Provider Notes (Signed)
[facility] Emergency Department Provider Note    First MD Initiated Contact with Patient 07/30/18 [phone]     (approximate)  I have reviewed the triage vital signs and the nursing notes.   HISTORY  Chief Complaint Allergic Reaction    HPI Brianna Leon is a 62 y.o. female with below list of chronic medical conditions presents to the emergency department with generalized pruritus and hives.  Patient states she awoke approximately 1 hour before with beforementioned symptoms.  Patient denies any difficulty breathing or swallowing.  Patient denies any lightheadedness.  Patient however does admit that she had 2 episodes of emesis.  Patient states that she attempted to take Benadryl however vomited after each attempt.   Past Medical History:  Diagnosis Date  . Arthritis    KNEES  . CAD (coronary artery disease)   . DM (diabetes mellitus), type 2 ([place]) 2000   insulin started 2016  . Elevated liver enzymes   . Family history of colonic polyps    father  . GERD (gastroesophageal reflux disease)   . Hemorrhage    DURING PREGNANCY 31 YEARS AGO  . Hyperlipidemia   . IBS (irritable bowel syndrome)   . Migraine     Patient Active Problem List   Diagnosis Date Noted  . Anxiety 05/17/2018  . Psychophysiological insomnia 05/17/2018  . Dizziness 03/15/2018  . Biliary colic 64/40/3474  . RUQ pain 06/12/2017  . CAD (coronary artery disease)   . Hyperlipidemia   . Hypertension   . GERD (gastroesophageal reflux disease)   . DM (diabetes mellitus), type 2 ([place])   . Health care maintenance 06/17/2015  . Elevated liver enzymes 03/26/2015  . Family history of polyps in the colon 03/26/2015  . Hx of nausea 03/26/2015  . Migraines 06/06/2014  . Chest tightness 04/03/2014    Past Surgical History:  Procedure Laterality Date  . ABDOMINAL HYSTERECTOMY  22 years ago  . CARDIAC CATHETERIZATION     x 2  . CARPAL TUNNEL RELEASE Right   . CHOLECYSTECTOMY N/A  09/20/2017   Procedure: LAPAROSCOPIC CHOLECYSTECTOMY WITH INTRAOPERATIVE CHOLANGIOGRAM;  Surgeon: Robert Bellow, MD;  Location: [facility] ORS;  Service: General;  Laterality: N/A;  . COLONOSCOPY  05/18/2015   repeat 5 years  . ESOPHAGOGASTRODUODENOSCOPY  05/18/2015  . KNEE ARTHROSCOPY  2001 and 2009  . TONSILLECTOMY      Prior to Admission medications   Medication Sig Start Date End Date Taking? Authorizing Provider  aspirin EC 81 MG tablet Take 81 mg by mouth daily.    [provider]  aspirin-acetaminophen-caffeine (EXCEDRIN MIGRAINE) [phone] MG tablet Take 2 tablets by mouth every 6 (six) hours as needed for headache.    [provider]  Aspirin-Caffeine (BAYER BACK & BODY) 500-32.5 MG TABS Take 2 tablets by mouth daily as needed (for pain).    [provider]  blood glucose meter kit and supplies KIT Dispense based on patient and insurance preference. Use up to four times daily as directed. (FOR ICD-9 250.00, 250.01). 10/24/16   Harvest Dark, MD  Dulaglutide (TRULICITY) 5.64 PP/2.9JJ SOPN Inject 0.75 mg into the skin once a week. 03/15/18   Johnson, Megan P, DO  EPINEPHrine (EPIPEN 2-PAK) 0.3 mg/0.3 mL IJ SOAJ injection Inject 0.3 mLs (0.3 mg total) into the muscle once for 1 dose. 07/30/18 07/30/18  Gregor Hams, MD  escitalopram (LEXAPRO) 20 MG tablet Take 1 tablet (20 mg total) by mouth daily. 05/17/18   Valerie Roys,  DO  Insulin Glargine (LANTUS SOLOSTAR) 100 UNIT/ML Solostar Pen Inject 20 Units into the skin daily at 10 pm. 03/15/18   Wynetta Emery, Megan P, DO  LORazepam (ATIVAN) 0.5 MG tablet Take 1 tablet (0.5 mg total) by mouth 2 (two) times daily as needed for anxiety. 05/17/18   Johnson, Megan P, DO  meclizine (ANTIVERT) 25 MG tablet Take 1 tablet (25 mg total) by mouth 3 (three) times daily as needed for dizziness. Patient not taking: Reported on 05/17/2018 10/24/16   Harvest Dark, MD  Multiple Vitamins-Minerals (OCUVITE ADULT 50+ PO) Take 1  capsule by mouth daily.    [provider]  omeprazole (PRILOSEC) 20 MG capsule Take 1 capsule (20 mg total) by mouth every morning. 12/14/17   Johnson, Megan P, DO  OVER THE COUNTER MEDICATION Take 2 capsules by mouth daily. Hearing Health Supplement    [provider]  predniSONE (DELTASONE) 20 MG tablet Take 2 tablets (40 mg total) by mouth daily for 5 days. 07/30/18 08/04/18  Gregor Hams, MD  promethazine (PHENERGAN) 25 MG tablet Take 25 mg by mouth every 6 (six) hours as needed for nausea or vomiting.    [provider]  simvastatin (ZOCOR) 40 MG tablet Take 1 tablet (40 mg total) by mouth at bedtime. 12/14/17   Johnson, Megan P, DO  traZODone (DESYREL) 50 MG tablet Take 0.5-1 tablets (25-50 mg total) by mouth at bedtime as needed for sleep. 05/17/18   Park Liter P, DO    Allergies Carafate [sucralfate]; Atorvastatin; Metformin and related; Other; Penicillins; and Tramadol  Family History  Problem Relation Age of Onset  . Colon polyps Father   . Diabetes Father   . Hypertension Father   . Heart disease Father   . Stroke Father   . Diabetes Mother   . Hypertension Mother   . Diabetes Brother   . Heart failure Brother        pacemaker  . Hypertension Brother   . Heart attack Brother   . Migraines Daughter   . Lupus Son   . Diabetes Maternal Grandmother   . Heart disease Maternal Grandmother   . Stroke Maternal Grandfather   . Hypertension Paternal Grandmother   . Stroke Paternal Grandfather   . Hyperlipidemia Brother   . Breast cancer Neg Hx     Social History Social History   Tobacco Use  . Smoking status: Never Smoker  . Smokeless tobacco: Never Used  Substance Use Topics  . Alcohol use: No  . Drug use: No    Review of Systems Constitutional: No fever/chills Eyes: No visual changes. ENT: No sore throat. Cardiovascular: Denies chest pain. Respiratory: Denies shortness of breath. Gastrointestinal: No abdominal pain.  No nausea, no  vomiting.  No diarrhea.  No constipation. Genitourinary: Negative for dysuria. Musculoskeletal: Negative for neck pain.  Negative for back pain. Integumentary: Positive for generalized pruritus and rash Neurological: Negative for headaches, focal weakness or numbness.  ____________________________________________   PHYSICAL EXAM:  VITAL SIGNS: ED Triage Vitals  Enc Vitals Group     BP 07/30/18 0514 134/69     Pulse Rate 07/30/18 0514 94     Resp 07/30/18 0514 18     Temp 07/30/18 0514 97.8 F (36.6 C)     Temp Source 07/30/18 0514 Oral     SpO2 07/30/18 0514 98 %     Weight 07/30/18 0514 55.3 kg (122 lb)     Height 07/30/18 0514 1.6 m ('5\' 3"' )  Head Circumference --      Peak Flow --      Pain Score 07/30/18 0513 0     Pain Loc --      Pain Edu? --      Excl. in [place]? --     Constitutional: Alert and oriented. Well appearing and in no acute distress. Eyes: Conjunctivae are normal.  Mouth/Throat: Mucous membranes are moist. Oropharynx non-erythematous. Neck: No stridor.  Cardiovascular: Normal rate, regular rhythm. Good peripheral circulation. Grossly normal heart sounds. Respiratory: Normal respiratory effort.  No retractions. Lungs CTAB. Gastrointestinal: Soft and nontender. No distention.  Musculoskeletal: No lower extremity tenderness nor edema. No gross deformities of extremities. Neurologic:  Normal speech and language. No gross focal neurologic deficits are appreciated.  Skin: Diffuse urticarial rash noted. Psychiatric: Mood and affect are normal. Speech and behavior are normal.     Procedures   ____________________________________________   INITIAL IMPRESSION / ASSESSMENT AND PLAN / ED COURSE  As part of my medical decision making, I reviewed the following data within the electronic MEDICAL RECORD NUMBER   62 year old female presented with above-stated history and physical exam consistent with acute allergic reaction of unknown etiology.  Patient given  Benadryl 25 mg IV, Solu-Medrol on 25 mg IV and Pepcid 20 mg IV with complete resolution of all symptoms.  Patient observed in the emergency department for approximately 3 hours without any return of symptoms.  Patient will be discharged home with prednisone and EpiPen with referral to Dr.Juengel allergist.    ____________________________________________  FINAL CLINICAL IMPRESSION(S) / ED DIAGNOSES  Final diagnoses:  Allergic reaction, initial encounter     MEDICATIONS GIVEN DURING THIS VISIT:  Medications  diphenhydrAMINE (BENADRYL) injection 25 mg (25 mg Intravenous Given 07/30/18 0525)  famotidine (PEPCID) IVPB 20 mg premix (0 mg Intravenous Stopped 07/30/18 0559)  methylPREDNISolone sodium succinate (SOLU-MEDROL) 125 mg/2 mL injection 125 mg (125 mg Intravenous Given 07/30/18 0525)     ED Discharge Orders         Ordered    EPINEPHrine (EPIPEN 2-PAK) 0.3 mg/0.3 mL IJ SOAJ injection   Once     07/30/18 0648    predniSONE (DELTASONE) 20 MG tablet  Daily     07/30/18 0648           Note:  This document was prepared using Dragon voice recognition software and may include unintentional dictation errors.    Gregor Hams, MD 07/30/18 2238

## 2018-08-02 ENCOUNTER — Ambulatory Visit: Payer: BLUE CROSS/BLUE SHIELD | Admitting: Family Medicine

## 2018-08-02 ENCOUNTER — Encounter: Payer: Self-pay | Admitting: Family Medicine

## 2018-08-02 VITALS — BP 130/74 | HR 69 | Wt 125.6 lb

## 2018-08-02 DIAGNOSIS — Z23 Encounter for immunization: Secondary | ICD-10-CM

## 2018-08-02 DIAGNOSIS — E118 Type 2 diabetes mellitus with unspecified complications: Secondary | ICD-10-CM

## 2018-08-02 DIAGNOSIS — T7840XA Allergy, unspecified, initial encounter: Secondary | ICD-10-CM

## 2018-08-02 DIAGNOSIS — E782 Mixed hyperlipidemia: Secondary | ICD-10-CM

## 2018-08-02 DIAGNOSIS — I2510 Atherosclerotic heart disease of native coronary artery without angina pectoris: Secondary | ICD-10-CM | POA: Diagnosis not present

## 2018-08-02 DIAGNOSIS — R197 Diarrhea, unspecified: Secondary | ICD-10-CM

## 2018-08-02 DIAGNOSIS — F419 Anxiety disorder, unspecified: Secondary | ICD-10-CM

## 2018-08-02 DIAGNOSIS — I10 Essential (primary) hypertension: Secondary | ICD-10-CM | POA: Diagnosis not present

## 2018-08-02 DIAGNOSIS — K909 Intestinal malabsorption, unspecified: Secondary | ICD-10-CM

## 2018-08-02 LAB — BAYER DCA HB A1C WAIVED: HB A1C (BAYER DCA - WAIVED): 9.4 % — ABNORMAL HIGH (ref <7.0)

## 2018-08-02 MED ORDER — TRAZODONE HCL 50 MG PO TABS: 25.0000 mg | ORAL_TABLET | Freq: Every evening | ORAL | 3 refills | Status: DC | PRN

## 2018-08-02 MED ORDER — CHOLESTYRAMINE 4 G PO PACK: 4.0000 g | PACK | Freq: Three times a day (TID) | ORAL | 3 refills | Status: DC

## 2018-08-02 MED ORDER — INSULIN GLARGINE 100 UNIT/ML SOLOSTAR PEN: 20.0000 [IU] | PEN_INJECTOR | Freq: Every day | SUBCUTANEOUS | 1 refills | Status: DC

## 2018-08-02 MED ORDER — OMEPRAZOLE 20 MG PO CPDR: 20.0000 mg | DELAYED_RELEASE_CAPSULE | ORAL | 1 refills | Status: DC

## 2018-08-02 MED ORDER — ESCITALOPRAM OXALATE 10 MG PO TABS: 15.0000 mg | ORAL_TABLET | Freq: Every day | ORAL | 3 refills | Status: DC

## 2018-08-02 MED ORDER — DULAGLUTIDE 1.5 MG/0.5ML ~~LOC~~ SOAJ: 1.5000 mg | SUBCUTANEOUS | 2 refills | Status: DC

## 2018-08-02 MED ORDER — SIMVASTATIN 40 MG PO TABS: 40.0000 mg | ORAL_TABLET | Freq: Every day | ORAL | 1 refills | Status: DC

## 2018-08-02 NOTE — Progress Notes (Signed)
BP 130/74 (BP Location: Left Arm, Patient Position: Sitting, Cuff Size: Normal)   Pulse 69   Wt 125 lb 9 oz (57 kg)   SpO2 99%   BMI 22.24 kg/m    Subjective:    Patient ID: Brianna Leon, female    DOB: 1955-12-01, 62 y.o.   MRN: [id number]  HPI: Brianna Leon is a 62 y.o. female  Chief Complaint  Patient presents with  . Diabetes  . Hyperlipidemia  . Hypertension  . Anxiety   Has been having episodes where she is waking up at night with a rash. Has not had a rash during the day. Only having a rash at night. This has been going on about 1-2 months. Went to the ER on Tuesday with nausea and vomiting. She has been on prednisone for the past 5 days. They recommended that she see an allergist.   Has been having a lot of issues with diarrhea. She notes that it is no different after having her gall bladder out. She has been having some blood with the diarrhea. She notes that it is mainly after she eats.   HYPERTENSION / HYPERLIPIDEMIA Satisfied with current treatment? yes Duration of hypertension: chronic BP monitoring frequency: not checking BP medication side effects: no Past BP meds: none Duration of hyperlipidemia: chronic Cholesterol medication side effects: no Cholesterol supplements: none Past cholesterol medications: simvastatin Medication compliance: excellent compliance Aspirin: yes Recent stressors: no Recurrent headaches: no Visual changes: no Palpitations: no Dyspnea: no Chest pain: no Lower extremity edema: no Dizzy/lightheaded: no  DIABETES Hypoglycemic episodes:no Polydipsia/polyuria: no Visual disturbance: no Chest pain: no Paresthesias: no Glucose Monitoring: no  Accucheck frequency: Not Checking Taking Insulin?: yes Blood Pressure Monitoring: not checking Retinal Examination: Up to Date Foot Exam: Done today Diabetic Education: Completed Pneumovax: Up to Date Influenza: Given today Aspirin: yes  ANXIETY/STRESS- has been taking 15mg  of  her lexapro. She notes that she was over treated and didn't care about anything on the 20mg . She does not want to take anything higher.  Duration: Chronic Status: Fluctuating Anxious mood: yes  Excessive worrying: yes Irritability: yes  Sweating: no Nausea: yes Palpitations:no Hyperventilation: no Panic attacks: no Agoraphobia: no  Obscessions/compulsions: no Depressed mood: yes Depression screen [facility] 2/9 08/02/2018 05/17/2018 03/22/2018 06/11/2017  Decreased Interest 1 0 0 0  Down, Depressed, Hopeless 1 0 0 0  PHQ - 2 Score 2 0 0 0  Altered sleeping 2 2 2  -  Tired, decreased energy 2 1 1  -  Change in appetite 2 1 2  -  Feeling bad or failure about yourself  1 0 0 -  Trouble concentrating 1 0 0 -  Moving slowly or fidgety/restless 0 0 0 -  Suicidal thoughts 0 0 0 -  PHQ-9 Score 10 4 5  -  Difficult doing work/chores Not difficult at all Somewhat difficult - -   GAD 7 : Generalized Anxiety Score 08/02/2018 05/17/2018 03/22/2018  Nervous, Anxious, on Edge 1 1 2   Control/stop worrying 1 1 1   Worry too much - different things 2 1 2   Trouble relaxing 1 1 1   Restless 0 0 0  Easily annoyed or irritable 2 1 2   Afraid - awful might happen 0 1 0  Total GAD 7 Score 7 6 8   Anxiety Difficulty - Somewhat difficult Somewhat difficult   Anhedonia: no Weight changes: no Insomnia: yes hard to stay asleep  Hypersomnia: no Fatigue/loss of energy: yes Feelings of worthlessness: no Feelings of guilt: no  Impaired concentration/indecisiveness: no Suicidal ideations: no  Crying spells: no Recent Stressors/Life Changes: yes   Relationship problems: no   Family stress: yes     Financial stress: yes    Job stress: yes    Recent death/loss: no   Relevant past medical, surgical, family and social history reviewed and updated as indicated. Interim medical history since our last visit reviewed. Allergies and medications reviewed and updated.  Review of Systems  Constitutional: Negative.     Respiratory: Negative.   Cardiovascular: Negative.   Psychiatric/Behavioral: Negative.     Per HPI unless specifically indicated above     Objective:    BP 130/74 (BP Location: Left Arm, Patient Position: Sitting, Cuff Size: Normal)   Pulse 69   Wt 125 lb 9 oz (57 kg)   SpO2 99%   BMI 22.24 kg/m   Wt Readings from Last 3 Encounters:  08/02/18 125 lb 9 oz (57 kg)  07/30/18 122 lb (55.3 kg)  05/17/18 128 lb 2 oz (58.1 kg)    Physical Exam  Constitutional: She is oriented to person, place, and time. She appears well-developed and well-nourished. No distress.  HENT:  Head: Normocephalic and atraumatic.  Right Ear: Hearing normal.  Left Ear: Hearing normal.  Nose: Nose normal.  Eyes: Conjunctivae and lids are normal. Right eye exhibits no discharge. Left eye exhibits no discharge. No scleral icterus.  Cardiovascular: Normal rate, regular rhythm, normal heart sounds and intact distal pulses. Exam reveals no gallop and no friction rub.  No murmur heard. Pulmonary/Chest: Effort normal and breath sounds normal. No stridor. No respiratory distress. She has no wheezes. She has no rales. She exhibits no tenderness.  Musculoskeletal: Normal range of motion.  Neurological: She is alert and oriented to person, place, and time.  Skin: Skin is warm, dry and intact. Capillary refill takes less than 2 seconds. No rash noted. She is not diaphoretic. No erythema. No pallor.  Psychiatric: She has a normal mood and affect. Her speech is normal and behavior is normal. Judgment and thought content normal. Cognition and memory are normal.  Nursing note and vitals reviewed.   Results for orders placed or performed in visit on 08/02/18  Bayer DCA Hb A1c Waived  Result Value Ref Range   HB A1C (BAYER DCA - WAIVED) 9.4 (H) <7.0 %      Assessment & Plan:   Problem List Items Addressed This Visit      Cardiovascular and Mediastinum   CAD (coronary artery disease)    Will try to keep BP and  cholesterol under control as well as sugars. Continue to monitor.       Relevant Medications   EPINEPHrine 0.3 mg/0.3 mL IJ SOAJ injection   cholestyramine (QUESTRAN) 4 g packet   simvastatin (ZOCOR) 40 MG tablet   Other Relevant Orders   CBC with Differential/Platelet   Comprehensive metabolic panel   Hypertension - Primary    Under good control on current regimen. Continue current regimen. Continue to monitor. Call with any concerns. Refills given.        Relevant Medications   EPINEPHrine 0.3 mg/0.3 mL IJ SOAJ injection   cholestyramine (QUESTRAN) 4 g packet   simvastatin (ZOCOR) 40 MG tablet   Other Relevant Orders   CBC with Differential/Platelet   Comprehensive metabolic panel     Endocrine   DM (diabetes mellitus), type 2 ([facility])    Not under good control with A1c too high at 9.4- will increase trulicity to 1.5mg  and  recheck in 1 month. Continue to monitor.       Relevant Medications   simvastatin (ZOCOR) 40 MG tablet   Dulaglutide (TRULICITY) 1.5 MG/0.5ML SOPN   Insulin Glargine (LANTUS SOLOSTAR) 100 UNIT/ML Solostar Pen   Other Relevant Orders   Bayer DCA Hb A1c Waived (Completed)   CBC with Differential/Platelet   Comprehensive metabolic panel     Other   Hyperlipidemia    Under good control on current regimen. Continue current regimen. Continue to monitor. Call with any concerns. Refills given.        Relevant Medications   EPINEPHrine 0.3 mg/0.3 mL IJ SOAJ injection   cholestyramine (QUESTRAN) 4 g packet   simvastatin (ZOCOR) 40 MG tablet   Other Relevant Orders   CBC with Differential/Platelet   Comprehensive metabolic panel   Lipid Panel w/o Chol/HDL Ratio   Anxiety    Not under good control. Does not want to increase medicine. Continue current regimen. Refills given. Call with any concerns.       Relevant Medications   escitalopram (LEXAPRO) 10 MG tablet   traZODone (DESYREL) 50 MG tablet    Other Visit Diagnoses    Allergic reaction, initial  encounter       No rash now. Unclear since it's only happening at night. Will get her into see allergy. Referral generated today.   Relevant Orders   Ambulatory referral to Allergy   Diarrhea due to malabsorption       Will start cholestyramine and see if it helps, follow up 1 month. If not better, will need to see GI.       Follow up plan: Return in about 4 weeks (around 08/30/2018) for Follow up diarrhea and sugars.

## 2018-08-02 NOTE — Assessment & Plan Note (Signed)
Not under good control. Does not want to increase medicine. Continue current regimen. Refills given. Call with any concerns.

## 2018-08-02 NOTE — Assessment & Plan Note (Signed)
Will try to keep BP and cholesterol under control as well as sugars. Continue to monitor.

## 2018-08-02 NOTE — Assessment & Plan Note (Signed)
Under good control on current regimen. Continue current regimen. Continue to monitor. Call with any concerns. Refills given.   

## 2018-08-02 NOTE — Assessment & Plan Note (Signed)
Not under good control with A1c too high at 9.4- will increase trulicity to 1.5mg  and recheck in 1 month. Continue to monitor.

## 2018-08-03 LAB — LIPID PANEL W/O CHOL/HDL RATIO
Cholesterol, Total: 218 mg/dL — ABNORMAL HIGH (ref 100–199)
HDL: 48 mg/dL (ref >39)
LDL Calculated: 148 mg/dL — ABNORMAL HIGH (ref 0–99)
Triglycerides: 111 mg/dL (ref 0–149)
VLDL Cholesterol Cal: 22 mg/dL (ref 5–40)

## 2018-08-03 LAB — COMPREHENSIVE METABOLIC PANEL
ALT: 24 IU/L (ref 0–32)
AST: 18 IU/L (ref 0–40)
Albumin/Globulin Ratio: 1.7 (ref 1.2–2.2)
Albumin: 4.7 g/dL (ref 3.6–4.8)
Alkaline Phosphatase: 67 IU/L (ref 39–117)
BUN/Creatinine Ratio: 34 — ABNORMAL HIGH (ref 12–28)
BUN: 23 mg/dL (ref 8–27)
Bilirubin Total: 0.4 mg/dL (ref 0.0–1.2)
CO2: 24 mmol/L (ref 20–29)
Calcium: 9.7 mg/dL (ref 8.7–10.3)
Chloride: 93 mmol/L — ABNORMAL LOW (ref 96–106)
Creatinine, Ser: 0.67 mg/dL (ref 0.57–1.00)
GFR calc Af Amer: 110 mL/min/{1.73_m2} (ref >59)
GFR calc non Af Amer: 96 mL/min/{1.73_m2} (ref >59)
Globulin, Total: 2.8 g/dL (ref 1.5–4.5)
Glucose: 396 mg/dL — ABNORMAL HIGH (ref 65–99)
Potassium: 4.8 mmol/L (ref 3.5–5.2)
Sodium: 134 mmol/L (ref 134–144)
Total Protein: 7.5 g/dL (ref 6.0–8.5)

## 2018-08-03 LAB — CBC WITH DIFFERENTIAL/PLATELET
Basophils Absolute: 0 10*3/uL (ref 0.0–0.2)
Basos: 0 %
EOS (ABSOLUTE): 0 10*3/uL (ref 0.0–0.4)
Eos: 0 %
Hematocrit: 38.8 % (ref 34.0–46.6)
Hemoglobin: 13.5 g/dL (ref 11.1–15.9)
Immature Grans (Abs): 0 10*3/uL (ref 0.0–0.1)
Immature Granulocytes: 0 %
Lymphocytes Absolute: 0.8 10*3/uL (ref 0.7–3.1)
Lymphs: 10 %
MCH: 31.9 pg (ref 26.6–33.0)
MCHC: 34.8 g/dL (ref 31.5–35.7)
MCV: 92 fL (ref 79–97)
Monocytes Absolute: 0.1 10*3/uL (ref 0.1–0.9)
Monocytes: 2 %
Neutrophils Absolute: 7.4 10*3/uL — ABNORMAL HIGH (ref 1.4–7.0)
Neutrophils: 88 %
Platelets: 250 10*3/uL (ref 150–450)
RBC: 4.23 x10E6/uL (ref 3.77–5.28)
RDW: 11.1 % — ABNORMAL LOW (ref 12.3–15.4)
WBC: 8.4 10*3/uL (ref 3.4–10.8)

## 2018-08-05 ENCOUNTER — Encounter: Payer: Self-pay | Admitting: Family Medicine

## 2018-09-06 DIAGNOSIS — T782XXA Anaphylactic shock, unspecified, initial encounter: Secondary | ICD-10-CM | POA: Diagnosis not present

## 2018-09-06 DIAGNOSIS — J309 Allergic rhinitis, unspecified: Secondary | ICD-10-CM | POA: Diagnosis not present

## 2018-09-13 ENCOUNTER — Ambulatory Visit: Payer: BLUE CROSS/BLUE SHIELD | Admitting: Family Medicine

## 2018-10-18 ENCOUNTER — Ambulatory Visit: Payer: BLUE CROSS/BLUE SHIELD | Admitting: Family Medicine

## 2018-12-03 ENCOUNTER — Ambulatory Visit: Payer: BLUE CROSS/BLUE SHIELD | Admitting: Family Medicine

## 2018-12-03 ENCOUNTER — Encounter: Payer: Self-pay | Admitting: Family Medicine

## 2018-12-03 VITALS — BP 136/84 | HR 78 | Temp 98.4°F | Ht 63.0 in | Wt 129.0 lb

## 2018-12-03 DIAGNOSIS — E119 Type 2 diabetes mellitus without complications: Secondary | ICD-10-CM | POA: Diagnosis not present

## 2018-12-03 DIAGNOSIS — R197 Diarrhea, unspecified: Secondary | ICD-10-CM | POA: Diagnosis not present

## 2018-12-03 DIAGNOSIS — F419 Anxiety disorder, unspecified: Secondary | ICD-10-CM

## 2018-12-03 DIAGNOSIS — R809 Proteinuria, unspecified: Secondary | ICD-10-CM | POA: Diagnosis not present

## 2018-12-03 DIAGNOSIS — K909 Intestinal malabsorption, unspecified: Secondary | ICD-10-CM

## 2018-12-03 DIAGNOSIS — H353131 Nonexudative age-related macular degeneration, bilateral, early dry stage: Secondary | ICD-10-CM | POA: Diagnosis not present

## 2018-12-03 DIAGNOSIS — E1129 Type 2 diabetes mellitus with other diabetic kidney complication: Secondary | ICD-10-CM | POA: Diagnosis not present

## 2018-12-03 DIAGNOSIS — H35369 Drusen (degenerative) of macula, unspecified eye: Secondary | ICD-10-CM | POA: Diagnosis not present

## 2018-12-03 LAB — HM DIABETES EYE EXAM

## 2018-12-03 LAB — BAYER DCA HB A1C WAIVED: HB A1C (BAYER DCA - WAIVED): 9 % — ABNORMAL HIGH (ref <7.0)

## 2018-12-03 MED ORDER — EMPAGLIFLOZIN 25 MG PO TABS: 25.0000 mg | ORAL_TABLET | Freq: Every day | ORAL | 3 refills | Status: DC

## 2018-12-03 NOTE — Assessment & Plan Note (Signed)
Uncontrolled. Has not been taking her medicine. Restart medicine. Recheck 1 month.

## 2018-12-03 NOTE — Progress Notes (Signed)
BP 136/84   Pulse 78   Temp 98.4 F (36.9 C) (Oral)   Ht 5\' 3"  (1.6 m)   Wt 129 lb (58.5 kg)   SpO2 100%   BMI 22.85 kg/m    Subjective:    Patient ID: Brianna Leon, female    DOB: May 16, 1956, 63 y.o.   MRN: [id number]  HPI: Brianna Leon is a 63 y.o. female  Chief Complaint  Patient presents with  . Diabetes  . Diarrhea   Has not been taking her medicine regularly. Has not been taking anything daily   ANXIETY/STRESS- not doing well. Has not been taking her medicine.  Duration:exacerbated  Anxious mood: yes  Excessive worrying: yes  Irritability: yes  Sweating: no  Nausea: no  Palpitations:no  Hyperventilation: no  Panic attacks: no  Agoraphobia: no  Obscessions/compulsions: no  Depressed mood: yes  [facility] 2/9 08/02/2018 05/17/2018 03/22/2018 06/11/2017  Decreased Interest 1 0 0 0  Down, Depressed, Hopeless 1 0 0 0  PHQ - 2 Score 2 0 0 0  Altered sleeping 2 2 2  -  Tired, decreased energy 2 1 1  -  Change in appetite 2 1 2  -  Feeling bad or failure about yourself  1 0 0 -  Trouble concentrating 1 0 0 -  Moving slowly or fidgety/restless 0 0 0 -  Suicidal thoughts 0 0 0 -  PHQ-9 Score 10 4 5  -  Difficult doing work/chores Not difficult at all Somewhat difficult - -   GAD 7 : Generalized Anxiety Score 08/02/2018 05/17/2018 03/22/2018  Nervous, Anxious, on Edge 1 1 2   Control/stop worrying 1 1 1   Worry too much - different things 2 1 2   Trouble relaxing 1 1 1   Restless 0 0 0  Easily annoyed or irritable 2 1 2   Afraid - awful might happen 0 1 0  Total GAD 7 Score 7 6 8   Anxiety Difficulty - Somewhat difficult Somewhat difficult   Anhedonia: no  Weight changes: no  Insomnia: no  Hypersomnia: no  Fatigue/loss of energy: yes  Feelings of worthlessness: yes  Feelings of guilt: yes  Impaired concentration/indecisiveness: yes  Suicidal ideations: no  Crying spells: no  Recent Stressors/Life Changes: yes  Relationship problems: yes  Family  stress: yes  Financial stress: no  Job stress: no  Recent death/loss: yes   DIABETES- has not been taking her insulin. States that when she does take it it doesn't make her feel good. She has been taking her trulicity almost weekly, but has been taking her lantus less than 1/2 the time.  Hypoglycemic episodes:no Polydipsia/polyuria: no Visual disturbance: no Chest pain: no Paresthesias: no Glucose Monitoring: no  Accucheck frequency: Not Checking Taking Insulin?: not regulary Blood Pressure Monitoring: not checking Retinal Examination: Not up to Date Foot Exam: Up to Date Diabetic Education: Completed Pneumovax: Up to Date Influenza: Up to Date Aspirin: yes  ABDOMINAL ISSUES Duration: months Nature: diarrhea- started on cholestyramine last visit in September Location: diffuse  Severity: moderate  Radiation: no Frequency: occasional Treatments attempted: cholestyramine Constipation: no Diarrhea: yes Episodes of diarrhea/day: Mucous in the stool: no Heartburn: no Bloating:yes Flatulence: yes Nausea: no Vomiting: no Melena or hematochezia: no Rash: no Jaundice: no Fever: no Weight loss: no  Relevant past medical, surgical, family and social history reviewed and updated as indicated. Interim medical history since our last visit reviewed. Allergies and medications reviewed and updated.  Review of Systems  Constitutional: Negative.   Respiratory:  Negative.   Cardiovascular: Negative.   Musculoskeletal: Negative.   Skin: Negative.   Neurological: Negative.   Psychiatric/Behavioral: Positive for dysphoric mood. Negative for agitation, behavioral problems, confusion, decreased concentration, hallucinations, self-injury, sleep disturbance and suicidal ideas. The patient is nervous/anxious. The patient is not hyperactive.     Per HPI unless specifically indicated above     Objective:    BP 136/84   Pulse 78   Temp 98.4 F (36.9 C) (Oral)   Ht 5\' 3"  (1.6 m)    Wt 129 lb (58.5 kg)   SpO2 100%   BMI 22.85 kg/m   Wt Readings from Last 3 Encounters:  12/03/18 129 lb (58.5 kg)  08/02/18 125 lb 9 oz (57 kg)  07/30/18 122 lb (55.3 kg)    Physical Exam Vitals signs and nursing note reviewed.  Constitutional:      General: She is not in acute distress.    Appearance: Normal appearance. She is not ill-appearing, toxic-appearing or diaphoretic.  HENT:     Head: Normocephalic and atraumatic.     Right Ear: External ear normal.     Left Ear: External ear normal.     Nose: Nose normal.     Mouth/Throat:     Mouth: Mucous membranes are moist.     Pharynx: Oropharynx is clear.  Eyes:     General: No scleral icterus.       Right eye: No discharge.        Left eye: No discharge.     Extraocular Movements: Extraocular movements intact.     Conjunctiva/sclera: Conjunctivae normal.     Pupils: Pupils are equal, round, and reactive to light.  Neck:     Musculoskeletal: Normal range of motion and neck supple.  Cardiovascular:     Rate and Rhythm: Normal rate and regular rhythm.     Pulses: Normal pulses.     Heart sounds: Normal heart sounds. No murmur. No friction rub. No gallop.   Pulmonary:     Effort: Pulmonary effort is normal. No respiratory distress.     Breath sounds: Normal breath sounds. No stridor. No wheezing, rhonchi or rales.  Chest:     Chest wall: No tenderness.  Musculoskeletal: Normal range of motion.  Skin:    General: Skin is warm and dry.     Capillary Refill: Capillary refill takes less than 2 seconds.     Coloration: Skin is not jaundiced or pale.     Findings: No bruising, erythema, lesion or rash.  Neurological:     General: No focal deficit present.     Mental Status: She is alert and oriented to person, place, and time. Mental status is at baseline.  Psychiatric:        Mood and Affect: Mood normal.        Behavior: Behavior normal.        Thought Content: Thought content normal.        Judgment: Judgment normal.      Results for orders placed or performed in visit on 12/03/18  Bayer DCA Hb A1c Waived  Result Value Ref Range   HB A1C (BAYER DCA - WAIVED) 9.0 (H) <7.0 %      Assessment & Plan:   Problem List Items Addressed This Visit      Endocrine   DM (diabetes mellitus), type 2 (HCC) - Primary    Uncontrolled with A1c of 9.0. Has not been taking her medicine. Does not feel well when she is  taking her medicine. Will stop lantus and start Jardiance. Recheck 1 month. Call with any concerns.       Relevant Medications   empagliflozin (JARDIANCE) 25 MG TABS tablet   Other Relevant Orders   Comprehensive metabolic panel   Bayer DCA Hb U8E Waived (Completed)     Other   Anxiety    Uncontrolled. Has not been taking her medicine. Restart medicine. Recheck 1 month.        Other Visit Diagnoses    Diarrhea due to malabsorption       Doing much better on cholestyramine. Continue to monitor. Call with any concerns.    Relevant Orders   Comprehensive metabolic panel       Follow up plan: Return in about 4 weeks (around 12/31/2018) for Follow up mood and diabetes.

## 2018-12-03 NOTE — Assessment & Plan Note (Signed)
Uncontrolled with A1c of 9.0. Has not been taking her medicine. Does not feel well when she is taking her medicine. Will stop lantus and start Jardiance. Recheck 1 month. Call with any concerns.

## 2018-12-04 LAB — COMPREHENSIVE METABOLIC PANEL
ALT: 19 IU/L (ref 0–32)
AST: 16 IU/L (ref 0–40)
Albumin/Globulin Ratio: 1.8 (ref 1.2–2.2)
Albumin: 4.7 g/dL (ref 3.8–4.9)
Alkaline Phosphatase: 64 IU/L (ref 39–117)
BUN/Creatinine Ratio: 30 — ABNORMAL HIGH (ref 12–28)
BUN: 14 mg/dL (ref 8–27)
Bilirubin Total: 0.6 mg/dL (ref 0.0–1.2)
CO2: 22 mmol/L (ref 20–29)
Calcium: 9.8 mg/dL (ref 8.7–10.3)
Chloride: 94 mmol/L — ABNORMAL LOW (ref 96–106)
Creatinine, Ser: 0.47 mg/dL — ABNORMAL LOW (ref 0.57–1.00)
GFR calc Af Amer: 124 mL/min/{1.73_m2} (ref >59)
GFR calc non Af Amer: 108 mL/min/{1.73_m2} (ref >59)
Globulin, Total: 2.6 g/dL (ref 1.5–4.5)
Glucose: 245 mg/dL — ABNORMAL HIGH (ref 65–99)
Potassium: 4.5 mmol/L (ref 3.5–5.2)
Sodium: 135 mmol/L (ref 134–144)
Total Protein: 7.3 g/dL (ref 6.0–8.5)

## 2018-12-24 ENCOUNTER — Other Ambulatory Visit: Payer: Self-pay | Admitting: Obstetrics & Gynecology

## 2018-12-24 DIAGNOSIS — Z01419 Encounter for gynecological examination (general) (routine) without abnormal findings: Secondary | ICD-10-CM | POA: Diagnosis not present

## 2018-12-24 DIAGNOSIS — Z1211 Encounter for screening for malignant neoplasm of colon: Secondary | ICD-10-CM | POA: Diagnosis not present

## 2018-12-24 DIAGNOSIS — Z1239 Encounter for other screening for malignant neoplasm of breast: Secondary | ICD-10-CM | POA: Diagnosis not present

## 2018-12-24 DIAGNOSIS — Z1231 Encounter for screening mammogram for malignant neoplasm of breast: Secondary | ICD-10-CM

## 2019-01-03 ENCOUNTER — Ambulatory Visit: Payer: BLUE CROSS/BLUE SHIELD | Admitting: Family Medicine

## 2019-01-03 ENCOUNTER — Other Ambulatory Visit: Payer: Self-pay

## 2019-01-03 ENCOUNTER — Encounter: Payer: Self-pay | Admitting: Family Medicine

## 2019-01-03 VITALS — BP 126/79 | HR 82 | Temp 98.5°F | Ht 63.0 in | Wt 123.0 lb

## 2019-01-03 DIAGNOSIS — E440 Moderate protein-calorie malnutrition: Secondary | ICD-10-CM | POA: Diagnosis not present

## 2019-01-03 DIAGNOSIS — R809 Proteinuria, unspecified: Secondary | ICD-10-CM | POA: Diagnosis not present

## 2019-01-03 DIAGNOSIS — F419 Anxiety disorder, unspecified: Secondary | ICD-10-CM | POA: Diagnosis not present

## 2019-01-03 DIAGNOSIS — E1129 Type 2 diabetes mellitus with other diabetic kidney complication: Secondary | ICD-10-CM | POA: Diagnosis not present

## 2019-01-03 NOTE — Assessment & Plan Note (Signed)
Better off medicine. Will remain off medicine. Call with any concerns.

## 2019-01-03 NOTE — Progress Notes (Signed)
BP 126/79   Pulse 82   Temp 98.5 F (36.9 C) (Oral)   Ht 5\' 3"  (1.6 m)   Wt 123 lb (55.8 kg)   SpO2 100%   BMI 21.79 kg/m    Subjective:    Patient ID: Brianna Leon, female    DOB: 1956-03-11, 63 y.o.   MRN: [id number]  HPI: Brianna Leon is a 63 y.o. female  Chief Complaint  Patient presents with  . Anxiety    4wf/u  . Diabetes   DIABETES- feeling better off the lantus.  Hypoglycemic episodes:yes- 1x a day, not eating, usually eats 2x a day- eating a piece of toast or a couple of crackers in the AM and then only occasionally eats lunch- usually not, eating a small dinner Polydipsia/polyuria: no Visual disturbance: no Chest pain: no Paresthesias: yes Glucose Monitoring: yes  Accucheck frequency: Daily  Fasting glucose: 130s-140s Taking Insulin?: no Blood Pressure Monitoring: not checking Retinal Examination: up to date Foot Exam: Up to Date Diabetic Education: Completed Pneumovax: Up to Date Influenza: Up to Date Aspirin: yes  ANXIETY- states that she has been doing well so she has not been taking any medicine.  Depression screen [facility] 2/9 01/03/2019 08/02/2018 05/17/2018 03/22/2018 06/11/2017  Decreased Interest 0 1 0 0 0  Down, Depressed, Hopeless 0 1 0 0 0  PHQ - 2 Score 0 2 0 0 0  Altered sleeping 0 2 2 2  -  Tired, decreased energy 0 2 1 1  -  Change in appetite 0 2 1 2  -  Feeling bad or failure about yourself  0 1 0 0 -  Trouble concentrating 0 1 0 0 -  Moving slowly or fidgety/restless 0 0 0 0 -  Suicidal thoughts 0 0 0 0 -  PHQ-9 Score 0 10 4 5  -  Difficult doing work/chores Not difficult at all Not difficult at all Somewhat difficult - -   Relevant past medical, surgical, family and social history reviewed and updated as indicated. Interim medical history since our last visit reviewed. Allergies and medications reviewed and updated.  Review of Systems  Constitutional: Negative.   Respiratory: Negative.   Cardiovascular: Negative.     Musculoskeletal: Negative.   Neurological: Positive for dizziness and weakness. Negative for tremors, seizures, syncope, facial asymmetry, speech difficulty, light-headedness, numbness and headaches.  Hematological: Negative.   Psychiatric/Behavioral: Negative.     Per HPI unless specifically indicated above     Objective:    BP 126/79   Pulse 82   Temp 98.5 F (36.9 C) (Oral)   Ht 5\' 3"  (1.6 m)   Wt 123 lb (55.8 kg)   SpO2 100%   BMI 21.79 kg/m   Wt Readings from Last 3 Encounters:  01/03/19 123 lb (55.8 kg)  12/03/18 129 lb (58.5 kg)  08/02/18 125 lb 9 oz (57 kg)    Physical Exam Vitals signs and nursing note reviewed.  Constitutional:      General: She is not in acute distress.    Appearance: Normal appearance. She is not ill-appearing, toxic-appearing or diaphoretic.  HENT:     Head: Normocephalic and atraumatic.     Comments: Temporal wasting    Right Ear: External ear normal.     Left Ear: External ear normal.     Nose: Nose normal.     Mouth/Throat:     Mouth: Mucous membranes are moist.     Pharynx: Oropharynx is clear.  Eyes:     General: No  scleral icterus.       Right eye: No discharge.        Left eye: No discharge.     Extraocular Movements: Extraocular movements intact.     Conjunctiva/sclera: Conjunctivae normal.     Pupils: Pupils are equal, round, and reactive to light.  Neck:     Musculoskeletal: Normal range of motion and neck supple.  Cardiovascular:     Rate and Rhythm: Normal rate and regular rhythm.     Pulses: Normal pulses.     Heart sounds: Normal heart sounds. No murmur. No friction rub. No gallop.   Pulmonary:     Effort: Pulmonary effort is normal. No respiratory distress.     Breath sounds: Normal breath sounds. No stridor. No wheezing, rhonchi or rales.  Chest:     Chest wall: No tenderness.  Musculoskeletal: Normal range of motion.  Skin:    General: Skin is warm and dry.     Capillary Refill: Capillary refill takes less  than 2 seconds.     Coloration: Skin is not jaundiced or pale.     Findings: No bruising, erythema, lesion or rash.  Neurological:     General: No focal deficit present.     Mental Status: She is alert and oriented to person, place, and time. Mental status is at baseline.  Psychiatric:        Mood and Affect: Mood normal.        Behavior: Behavior normal.        Thought Content: Thought content normal.        Judgment: Judgment normal.     Results for orders placed or performed in visit on 12/03/18  HM DIABETES EYE EXAM  Result Value Ref Range   HM Diabetic Eye Exam No Retinopathy No Retinopathy      Assessment & Plan:   Problem List Items Addressed This Visit      Endocrine   DM (diabetes mellitus), type 2 (HCC) - Primary    Doing better. Still going low. Not good eating habits. Will continue current regimen and recheck in 2 months. Call with any concerns.       Relevant Orders   Basic metabolic panel     Other   Anxiety    Better off medicine. Will remain off medicine. Call with any concerns.       Malnutrition of moderate degree (HCC)    Discussion today about her eating habits- needs to increase protein and vegetables. Call with any concerns.           Follow up plan: Return in about 2 months (around 03/04/2019).

## 2019-01-03 NOTE — Assessment & Plan Note (Signed)
Doing better. Still going low. Not good eating habits. Will continue current regimen and recheck in 2 months. Call with any concerns.

## 2019-01-03 NOTE — Assessment & Plan Note (Signed)
Discussion today about her eating habits- needs to increase protein and vegetables. Call with any concerns.

## 2019-01-04 LAB — BASIC METABOLIC PANEL
BUN/Creatinine Ratio: 22 (ref 12–28)
BUN: 15 mg/dL (ref 8–27)
CO2: 23 mmol/L (ref 20–29)
Calcium: 10.6 mg/dL — ABNORMAL HIGH (ref 8.7–10.3)
Chloride: 102 mmol/L (ref 96–106)
Creatinine, Ser: 0.69 mg/dL (ref 0.57–1.00)
GFR calc Af Amer: 109 mL/min/{1.73_m2} (ref >59)
GFR calc non Af Amer: 95 mL/min/{1.73_m2} (ref >59)
Glucose: 146 mg/dL — ABNORMAL HIGH (ref 65–99)
Potassium: 4.6 mmol/L (ref 3.5–5.2)
Sodium: 143 mmol/L (ref 134–144)

## 2019-01-08 ENCOUNTER — Ambulatory Visit
Admission: RE | Admit: 2019-01-08 | Discharge: 2019-01-08 | Disposition: A | Discharge status: Home / Self Care | Payer: BLUE CROSS/BLUE SHIELD | Source: Ambulatory Visit | Attending: Obstetrics & Gynecology | Admitting: Obstetrics & Gynecology

## 2019-01-08 DIAGNOSIS — Z1231 Encounter for screening mammogram for malignant neoplasm of breast: Secondary | ICD-10-CM

## 2019-03-07 ENCOUNTER — Other Ambulatory Visit: Payer: Self-pay

## 2019-03-07 ENCOUNTER — Encounter: Payer: Self-pay | Admitting: Family Medicine

## 2019-03-07 ENCOUNTER — Ambulatory Visit (INDEPENDENT_AMBULATORY_CARE_PROVIDER_SITE_OTHER): Payer: BLUE CROSS/BLUE SHIELD | Admitting: Family Medicine

## 2019-03-07 VITALS — BP 120/68 | Temp 96.6°F | Ht 63.0 in | Wt 120.6 lb

## 2019-03-07 DIAGNOSIS — E782 Mixed hyperlipidemia: Secondary | ICD-10-CM

## 2019-03-07 DIAGNOSIS — R809 Proteinuria, unspecified: Secondary | ICD-10-CM

## 2019-03-07 DIAGNOSIS — F419 Anxiety disorder, unspecified: Secondary | ICD-10-CM

## 2019-03-07 DIAGNOSIS — E1129 Type 2 diabetes mellitus with other diabetic kidney complication: Secondary | ICD-10-CM

## 2019-03-07 DIAGNOSIS — I129 Hypertensive chronic kidney disease with stage 1 through stage 4 chronic kidney disease, or unspecified chronic kidney disease: Secondary | ICD-10-CM

## 2019-03-07 DIAGNOSIS — Z114 Encounter for screening for human immunodeficiency virus [HIV]: Secondary | ICD-10-CM

## 2019-03-07 MED ORDER — SIMVASTATIN 40 MG PO TABS: 40.0000 mg | ORAL_TABLET | Freq: Every day | ORAL | 1 refills | Status: DC

## 2019-03-07 MED ORDER — EMPAGLIFLOZIN 25 MG PO TABS: 25.0000 mg | ORAL_TABLET | Freq: Every day | ORAL | 1 refills | Status: DC

## 2019-03-07 MED ORDER — DULAGLUTIDE 1.5 MG/0.5ML ~~LOC~~ SOAJ: 1.5000 mg | SUBCUTANEOUS | 2 refills | Status: DC

## 2019-03-07 NOTE — Assessment & Plan Note (Signed)
Will check labs and adjust medication as needed. Await results. Refills given.  

## 2019-03-07 NOTE — Assessment & Plan Note (Signed)
Will check labs and adjust medication as needed. Await results. Refills given.

## 2019-03-07 NOTE — Assessment & Plan Note (Addendum)
Will check labs and adjust medication as needed. Await results.

## 2019-03-07 NOTE — Assessment & Plan Note (Signed)
Doing well off medicine. Continue to monitor. Call with any concerns.  

## 2019-03-07 NOTE — Progress Notes (Signed)
BP 120/68 (BP Location: Left Arm, Patient Position: Sitting, Cuff Size: Normal)   Temp (!) 96.6 F (35.9 C) (Oral)   Ht 5\' 3"  (1.6 m)   Wt 120 lb 9.6 oz (54.7 kg)   BMI 21.36 kg/m    Subjective:    Patient ID: Brianna Leon, female    DOB: 09-20-1956, 63 y.o.   MRN: [id number]  HPI: Brianna Leon is a 63 y.o. female  Chief Complaint  Patient presents with  . Diabetes    No complaints  . Anxiety  . Hyperlipidemia  . Hypertension   DIABETES Hypoglycemic episodes:doing better- has been eating better Polydipsia/polyuria: yes Visual disturbance: no Chest pain: no Paresthesias: no Glucose Monitoring: yes  Accucheck frequency: Occasional- running around 120 in the AM Taking Insulin?: no Blood Pressure Monitoring: not checking Retinal Examination: Not up to Date Foot Exam: Up to Date Diabetic Education: Completed Pneumovax: Up to Date Influenza: Up to Date Aspirin: no  ANXIETY/STRESS Duration:stable Anxious mood: no  Excessive worrying: no Irritability: no  Sweating: no Nausea: no Palpitations:no Hyperventilation: no Panic attacks: no Agoraphobia: no  Obscessions/compulsions: no Depressed mood: no Depression screen [facility] 2/9 03/07/2019 01/03/2019 08/02/2018 05/17/2018 03/22/2018  Decreased Interest 0 0 1 0 0  Down, Depressed, Hopeless 0 0 1 0 0  PHQ - 2 Score 0 0 2 0 0  Altered sleeping 0 0 2 2 2   Tired, decreased energy 0 0 2 1 1   Change in appetite 0 0 2 1 2   Feeling bad or failure about yourself  0 0 1 0 0  Trouble concentrating 0 0 1 0 0  Moving slowly or fidgety/restless 0 0 0 0 0  Suicidal thoughts 0 0 0 0 0  PHQ-9 Score 0 0 10 4 5   Difficult doing work/chores - Not difficult at all Not difficult at all Somewhat difficult -   GAD 7 : Generalized Anxiety Score 03/07/2019 08/02/2018 05/17/2018 03/22/2018  Nervous, Anxious, on Edge 0 1 1 2   Control/stop worrying 0 1 1 1   Worry too much - different things 0 2 1 2   Trouble relaxing 0 1 1 1   Restless 0 0 0 0   Easily annoyed or irritable 0 2 1 2   Afraid - awful might happen 0 0 1 0  Total GAD 7 Score 0 7 6 8   Anxiety Difficulty - - Somewhat difficult Somewhat difficult   Anhedonia: no Weight changes: no Insomnia: no   Hypersomnia: no Fatigue/loss of energy: no Feelings of worthlessness: no Feelings of guilt: no Impaired concentration/indecisiveness: no Suicidal ideations: no  Crying spells: no Recent Stressors/Life Changes: yes   Relationship problems: no   Family stress: yes     Financial stress: yes    Job stress: yes    Recent death/loss: no  HYPERTENSION / HYPERLIPIDEMIA Satisfied with current treatment? yes Duration of hypertension: chronic BP monitoring frequency: not checking BP medication side effects: no Past BP meds: none Duration of hyperlipidemia: chronic Cholesterol medication side effects: no Cholesterol supplements: none Past cholesterol medications:  Medication compliance: good compliance Aspirin: yes Recent stressors: yes Recurrent headaches: no Visual changes: no Palpitations: no Dyspnea: no Chest pain: no Lower extremity edema: no Dizzy/lightheaded: yes  Relevant past medical, surgical, family and social history reviewed and updated as indicated. Interim medical history since our last visit reviewed. Allergies and medications reviewed and updated.  Review of Systems  Constitutional: Negative.   Respiratory: Negative.   Cardiovascular: Negative.   Musculoskeletal: Negative.  Neurological: Negative.   Psychiatric/Behavioral: Negative.     Per HPI unless specifically indicated above     Objective:    BP 120/68 (BP Location: Left Arm, Patient Position: Sitting, Cuff Size: Normal)   Temp (!) 96.6 F (35.9 C) (Oral)   Ht 5\' 3"  (1.6 m)   Wt 120 lb 9.6 oz (54.7 kg)   BMI 21.36 kg/m   Wt Readings from Last 3 Encounters:  03/07/19 120 lb 9.6 oz (54.7 kg)  01/03/19 123 lb (55.8 kg)  12/03/18 129 lb (58.5 kg)    Physical Exam Vitals signs  and nursing note reviewed.  Pulmonary:     Effort: Pulmonary effort is normal. No respiratory distress.     Comments: Speaking in full sentences Neurological:     Mental Status: She is alert.  Psychiatric:        Mood and Affect: Mood normal.        Behavior: Behavior normal.        Thought Content: Thought content normal.        Judgment: Judgment normal.     Results for orders placed or performed in visit on 01/03/19  Basic metabolic panel  Result Value Ref Range   Glucose 146 (H) 65 - 99 mg/dL   BUN 15 8 - 27 mg/dL   Creatinine, Ser 1.610.69 0.57 - 1.00 mg/dL   GFR calc non Af Amer 95 >59 mL/min/1.73   GFR calc Af Amer 109 >59 mL/min/1.73   BUN/Creatinine Ratio 22 12 - 28   Sodium 143 134 - 144 mmol/L   Potassium 4.6 3.5 - 5.2 mmol/L   Chloride 102 96 - 106 mmol/L   CO2 23 20 - 29 mmol/L   Calcium 10.6 (H) 8.7 - 10.3 mg/dL      Assessment & Plan:   Problem List Items Addressed This Visit      Endocrine   DM (diabetes mellitus), type 2 (HCC) - Primary    Will check labs and adjust medication as needed. Await results. Refills given.       Relevant Medications   simvastatin (ZOCOR) 40 MG tablet   empagliflozin (JARDIANCE) 25 MG TABS tablet   Dulaglutide (TRULICITY) 1.5 MG/0.5ML SOPN   Other Relevant Orders   Bayer DCA Hb A1c Waived   Comprehensive metabolic panel   Microalbumin, Urine Waived   TSH   UA/M w/rflx Culture, Routine   CBC with Differential/Platelet     Genitourinary   Benign hypertensive renal disease    Will check labs and adjust medication as needed. Await results.       Relevant Orders   Comprehensive metabolic panel   Microalbumin, Urine Waived   TSH   UA/M w/rflx Culture, Routine   CBC with Differential/Platelet     Other   Hyperlipidemia    Will check labs and adjust medication as needed. Await results. Refills given.       Relevant Medications   simvastatin (ZOCOR) 40 MG tablet   Other Relevant Orders   Comprehensive metabolic  panel   Lipid Panel w/o Chol/HDL Ratio   TSH   UA/M w/rflx Culture, Routine   CBC with Differential/Platelet   Anxiety    Doing well off medicine. Continue to monitor. Call with any concerns.       Relevant Orders   Comprehensive metabolic panel   TSH   UA/M w/rflx Culture, Routine   CBC with Differential/Platelet    Other Visit Diagnoses    Screening for HIV without presence of  risk factors       Will get labs drawn, await results.    Relevant Orders   HIV Antibody (routine testing w rflx)       Follow up plan: Return in about 3 months (around 06/07/2019) for follow up.    . This visit was completed via telephone due to the restrictions of the COVID-19 pandemic. All issues as above were discussed and addressed but no physical exam was performed. If it was felt that the patient should be evaluated in the office, they were directed there. The patient verbally consented to this visit. Patient was unable to complete an audio/visual visit due to Lack of equipment. Due to the catastrophic nature of the COVID-19 pandemic, this visit was done through audio contact only. . Location of the patient: home . Location of the provider: work . Those involved with this call:  . Provider: Olevia Perches, DO . CMA: Myrtha Mantis, CMA . Front Desk/Registration: Adela Ports  . Time spent on call: 30 minutes on the phone discussing health concerns. 45 minutes total spent in review of patient's record and preparation of their chart.

## 2019-03-14 ENCOUNTER — Other Ambulatory Visit: Payer: Self-pay

## 2019-03-14 ENCOUNTER — Other Ambulatory Visit: Payer: BLUE CROSS/BLUE SHIELD

## 2019-03-14 DIAGNOSIS — E1129 Type 2 diabetes mellitus with other diabetic kidney complication: Secondary | ICD-10-CM

## 2019-03-14 DIAGNOSIS — R809 Proteinuria, unspecified: Secondary | ICD-10-CM

## 2019-03-14 DIAGNOSIS — F419 Anxiety disorder, unspecified: Secondary | ICD-10-CM

## 2019-03-14 DIAGNOSIS — I129 Hypertensive chronic kidney disease with stage 1 through stage 4 chronic kidney disease, or unspecified chronic kidney disease: Secondary | ICD-10-CM

## 2019-03-14 DIAGNOSIS — Z114 Encounter for screening for human immunodeficiency virus [HIV]: Secondary | ICD-10-CM

## 2019-03-14 DIAGNOSIS — E782 Mixed hyperlipidemia: Secondary | ICD-10-CM | POA: Diagnosis not present

## 2019-03-15 LAB — COMPREHENSIVE METABOLIC PANEL
ALT: 20 IU/L (ref 0–32)
AST: 22 IU/L (ref 0–40)
Albumin/Globulin Ratio: 2 (ref 1.2–2.2)
Albumin: 4.8 g/dL (ref 3.8–4.8)
Alkaline Phosphatase: 79 IU/L (ref 39–117)
BUN/Creatinine Ratio: 34 — ABNORMAL HIGH (ref 12–28)
BUN: 17 mg/dL (ref 8–27)
Bilirubin Total: 0.4 mg/dL (ref 0.0–1.2)
CO2: 25 mmol/L (ref 20–29)
Calcium: 10 mg/dL (ref 8.7–10.3)
Chloride: 101 mmol/L (ref 96–106)
Creatinine, Ser: 0.5 mg/dL — ABNORMAL LOW (ref 0.57–1.00)
GFR calc Af Amer: 121 mL/min/{1.73_m2} (ref >59)
GFR calc non Af Amer: 105 mL/min/{1.73_m2} (ref >59)
Globulin, Total: 2.4 g/dL (ref 1.5–4.5)
Glucose: 88 mg/dL (ref 65–99)
Potassium: 4.8 mmol/L (ref 3.5–5.2)
Sodium: 142 mmol/L (ref 134–144)
Total Protein: 7.2 g/dL (ref 6.0–8.5)

## 2019-03-15 LAB — CBC WITH DIFFERENTIAL/PLATELET
Basophils Absolute: 0.1 10*3/uL (ref 0.0–0.2)
Basos: 1 %
EOS (ABSOLUTE): 0.2 10*3/uL (ref 0.0–0.4)
Eos: 2 %
Hematocrit: 42.2 % (ref 34.0–46.6)
Hemoglobin: 14.4 g/dL (ref 11.1–15.9)
Immature Grans (Abs): 0 10*3/uL (ref 0.0–0.1)
Immature Granulocytes: 0 %
Lymphocytes Absolute: 3.1 10*3/uL (ref 0.7–3.1)
Lymphs: 32 %
MCH: 32.2 pg (ref 26.6–33.0)
MCHC: 34.1 g/dL (ref 31.5–35.7)
MCV: 94 fL (ref 79–97)
Monocytes Absolute: 0.7 10*3/uL (ref 0.1–0.9)
Monocytes: 7 %
Neutrophils Absolute: 5.7 10*3/uL (ref 1.4–7.0)
Neutrophils: 58 %
Platelets: 278 10*3/uL (ref 150–450)
RBC: 4.47 x10E6/uL (ref 3.77–5.28)
RDW: 11.5 % — ABNORMAL LOW (ref 11.7–15.4)
WBC: 9.8 10*3/uL (ref 3.4–10.8)

## 2019-03-15 LAB — LIPID PANEL W/O CHOL/HDL RATIO
Cholesterol, Total: 172 mg/dL (ref 100–199)
HDL: 54 mg/dL (ref >39)
LDL Calculated: 95 mg/dL (ref 0–99)
Triglycerides: 117 mg/dL (ref 0–149)
VLDL Cholesterol Cal: 23 mg/dL (ref 5–40)

## 2019-03-15 LAB — TSH: TSH: 1.01 u[IU]/mL (ref 0.450–4.500)

## 2019-03-15 LAB — HIV ANTIBODY (ROUTINE TESTING W REFLEX): HIV Screen 4th Generation wRfx: NONREACTIVE

## 2019-03-16 LAB — UA/M W/RFLX CULTURE, ROUTINE
Bilirubin, UA: NEGATIVE
Ketones, UA: NEGATIVE
Nitrite, UA: NEGATIVE
Protein,UA: NEGATIVE
RBC, UA: NEGATIVE
Specific Gravity, UA: 1.01 (ref 1.005–1.030)
Urobilinogen, Ur: 0.2 mg/dL (ref 0.2–1.0)
pH, UA: 5 (ref 5.0–7.5)

## 2019-03-16 LAB — MICROALBUMIN, URINE WAIVED
Creatinine, Urine Waived: 50 mg/dL (ref 10–300)
Microalb, Ur Waived: 10 mg/L (ref 0–19)
Microalb/Creat Ratio: <30 mg/g (ref <30)

## 2019-03-16 LAB — MICROSCOPIC EXAMINATION: RBC: NONE SEEN /hpf (ref 0–2)

## 2019-03-16 LAB — BAYER DCA HB A1C WAIVED: HB A1C (BAYER DCA - WAIVED): 8 % — ABNORMAL HIGH (ref <7.0)

## 2019-03-16 LAB — URINE CULTURE, REFLEX: Organism ID, Bacteria: NO GROWTH

## 2019-04-05 ENCOUNTER — Other Ambulatory Visit: Payer: Self-pay | Admitting: Family Medicine

## 2019-04-28 DIAGNOSIS — I208 Other forms of angina pectoris: Secondary | ICD-10-CM | POA: Diagnosis not present

## 2019-04-28 DIAGNOSIS — R0602 Shortness of breath: Secondary | ICD-10-CM | POA: Diagnosis not present

## 2019-04-28 DIAGNOSIS — G43909 Migraine, unspecified, not intractable, without status migrainosus: Secondary | ICD-10-CM | POA: Diagnosis not present

## 2019-04-28 DIAGNOSIS — I2510 Atherosclerotic heart disease of native coronary artery without angina pectoris: Secondary | ICD-10-CM | POA: Diagnosis not present

## 2019-06-10 ENCOUNTER — Encounter: Payer: BLUE CROSS/BLUE SHIELD | Admitting: Family Medicine

## 2019-06-20 ENCOUNTER — Encounter: Payer: BLUE CROSS/BLUE SHIELD | Admitting: Family Medicine

## 2019-06-23 ENCOUNTER — Other Ambulatory Visit: Payer: Self-pay | Admitting: Family Medicine

## 2019-06-23 NOTE — Telephone Encounter (Signed)
Please advise these were last filled x 1 year ago

## 2019-06-24 NOTE — Telephone Encounter (Signed)
She has not been on either of those medicines recently and is overdue for a DM follow up- she will need an appointment to restart them.

## 2019-06-25 ENCOUNTER — Ambulatory Visit (INDEPENDENT_AMBULATORY_CARE_PROVIDER_SITE_OTHER): Payer: BC Managed Care – PPO | Admitting: Family Medicine

## 2019-06-25 ENCOUNTER — Other Ambulatory Visit: Payer: Self-pay

## 2019-06-25 ENCOUNTER — Encounter: Payer: Self-pay | Admitting: Family Medicine

## 2019-06-25 VITALS — BP 120/64 | HR 83

## 2019-06-25 DIAGNOSIS — F419 Anxiety disorder, unspecified: Secondary | ICD-10-CM

## 2019-06-25 DIAGNOSIS — E1129 Type 2 diabetes mellitus with other diabetic kidney complication: Secondary | ICD-10-CM

## 2019-06-25 DIAGNOSIS — R42 Dizziness and giddiness: Secondary | ICD-10-CM | POA: Diagnosis not present

## 2019-06-25 DIAGNOSIS — R809 Proteinuria, unspecified: Secondary | ICD-10-CM | POA: Diagnosis not present

## 2019-06-25 MED ORDER — LORAZEPAM 0.5 MG PO TABS: 0.5000 mg | ORAL_TABLET | Freq: Two times a day (BID) | ORAL | 0 refills | Status: DC | PRN

## 2019-06-25 MED ORDER — ESCITALOPRAM OXALATE 10 MG PO TABS: ORAL_TABLET | ORAL | 3 refills | Status: DC

## 2019-06-25 NOTE — Progress Notes (Signed)
BP 120/64   Pulse 83    Subjective:    Patient ID: Brianna Leon, female    DOB: 07/17/1956, 63 y.o.   MRN: [id number]  HPI: Brianna Leon is a 63 y.o. female  Chief Complaint  Patient presents with  . Anxiety  . Depression   DEPRESSION- has not been doing well. She notes that she has a lot of stuff going on in her personal life. Her mother is very ill and she thinks that she's going to pass. Has other family members who are ill. Has been under a lot of stress. Feels like she needs to get back on her medication.  Mood status: exacerbated Satisfied with current treatment?: no Symptom severity: moderate  Duration of current treatment : has been off of everything for the past 6 months or so Side effects: no Medication compliance: poor compliance Psychotherapy/counseling: no  Previous psychiatric medications:  Depressed mood: yes Anxious mood: yes Anhedonia: no Significant weight loss or gain: no Insomnia: yes hard to fall asleep Fatigue: yes Feelings of worthlessness or guilt: yes Impaired concentration/indecisiveness: yes Suicidal ideations: no Hopelessness: no Crying spells: yes Depression screen [facility] 2/9 06/25/2019 03/07/2019 01/03/2019 08/02/2018 05/17/2018  Decreased Interest 0 0 0 1 0  Down, Depressed, Hopeless 1 0 0 1 0  PHQ - 2 Score 1 0 0 2 0  Altered sleeping 3 0 0 2 2  Tired, decreased energy 3 0 0 2 1  Change in appetite 2 0 0 2 1  Feeling bad or failure about yourself  0 0 0 1 0  Trouble concentrating 0 0 0 1 0  Moving slowly or fidgety/restless 3 0 0 0 0  Suicidal thoughts 0 0 0 0 0  PHQ-9 Score 12 0 0 10 4  Difficult doing work/chores Very difficult - Not difficult at all Not difficult at all Somewhat difficult   GAD 7 : Generalized Anxiety Score 06/25/2019 03/07/2019 08/02/2018 05/17/2018  Nervous, Anxious, on Edge 3 0 1 1  Control/stop worrying 3 0 1 1  Worry too much - different things 3 0 2 1  Trouble relaxing 3 0 1 1  Restless 3 0 0 0  Easily annoyed  or irritable 3 0 2 1  Afraid - awful might happen 3 0 0 1  Total GAD 7 Score 21 0 7 6  Anxiety Difficulty Somewhat difficult - - Somewhat difficult   DIABETES Hypoglycemic episodes:yes- when she doesn't eat Polydipsia/polyuria: no Visual disturbance: no Chest pain: no Paresthesias: no Glucose Monitoring: yes  Accucheck frequency: Daily  Fasting glucose:151 this AM, 114-130 Taking Insulin?: no Blood Pressure Monitoring: not checking Retinal Examination: Up to Date Foot Exam: Up to Date Diabetic Education: Completed Pneumovax: Up to Date Influenza: get in the fall Aspirin: yes   Has been having dizziness when she rolls over in bed for the past couple of days. Has been taking meclizine. Feels like her vertigo is acting up again. No other concerns or complaints at this time.   Relevant past medical, surgical, family and social history reviewed and updated as indicated. Interim medical history since our last visit reviewed. Allergies and medications reviewed and updated.  Review of Systems  Constitutional: Positive for fatigue. Negative for activity change, appetite change, chills, diaphoresis, fever and unexpected weight change.  Respiratory: Negative.   Cardiovascular: Negative.   Gastrointestinal: Negative.   Musculoskeletal: Negative.   Skin: Negative.   Neurological: Positive for dizziness. Negative for tremors, seizures, syncope, facial asymmetry, speech difficulty,  weakness, light-headedness, numbness and headaches.  Psychiatric/Behavioral: Positive for decreased concentration, dysphoric mood and sleep disturbance. Negative for agitation, behavioral problems, confusion, hallucinations, self-injury and suicidal ideas. The patient is nervous/anxious. The patient is not hyperactive.     Per HPI unless specifically indicated above     Objective:    BP 120/64   Pulse 83   Wt Readings from Last 3 Encounters:  03/07/19 120 lb 9.6 oz (54.7 kg)  01/03/19 123 lb (55.8 kg)   12/03/18 129 lb (58.5 kg)    Physical Exam Vitals signs and nursing note reviewed.  Constitutional:      General: She is not in acute distress.    Appearance: Normal appearance. She is not ill-appearing, toxic-appearing or diaphoretic.  HENT:     Head: Normocephalic and atraumatic.     Right Ear: External ear normal.     Left Ear: External ear normal.     Nose: Nose normal.     Mouth/Throat:     Mouth: Mucous membranes are moist.     Pharynx: Oropharynx is clear.  Eyes:     General: No scleral icterus.       Right eye: No discharge.        Left eye: No discharge.     Conjunctiva/sclera: Conjunctivae normal.     Pupils: Pupils are equal, round, and reactive to light.  Neck:     Musculoskeletal: Normal range of motion.  Pulmonary:     Effort: Pulmonary effort is normal. No respiratory distress.     Comments: Speaking in full sentences Musculoskeletal: Normal range of motion.  Skin:    Coloration: Skin is not jaundiced or pale.     Findings: No bruising, erythema, lesion or rash.  Neurological:     Mental Status: She is alert and oriented to person, place, and time. Mental status is at baseline.  Psychiatric:        Mood and Affect: Mood is anxious and depressed.        Behavior: Behavior normal.        Thought Content: Thought content normal.        Judgment: Judgment normal.     Results for orders placed or performed in visit on 03/14/19  Microscopic Examination   URINE  Result Value Ref Range   WBC, UA 0-5 0 - 5 /hpf   RBC None seen 0 - 2 /hpf   Epithelial Cells (non renal) 0-10 0 - 10 /hpf   Bacteria, UA Few (A) None seen/Few  Urine Culture, Reflex   URINE  Result Value Ref Range   Urine Culture, Routine Final report    Organism ID, Bacteria No growth   HIV Antibody (routine testing w rflx)  Result Value Ref Range   HIV Screen 4th Generation wRfx Non Reactive Non Reactive  CBC with Differential/Platelet  Result Value Ref Range   WBC 9.8 3.4 - 10.8 x10E3/uL    RBC 4.47 3.77 - 5.28 x10E6/uL   Hemoglobin 14.4 11.1 - 15.9 g/dL   Hematocrit 98.142.2 19.134.0 - 46.6 %   MCV 94 79 - 97 fL   MCH 32.2 26.6 - 33.0 pg   MCHC 34.1 31.5 - 35.7 g/dL   RDW 47.811.5 (L) 29.511.7 - 62.115.4 %   Platelets 278 150 - 450 x10E3/uL   Neutrophils 58 Not Estab. %   Lymphs 32 Not Estab. %   Monocytes 7 Not Estab. %   Eos 2 Not Estab. %   Basos 1 Not Estab. %  Neutrophils Absolute 5.7 1.4 - 7.0 x10E3/uL   Lymphocytes Absolute 3.1 0.7 - 3.1 x10E3/uL   Monocytes Absolute 0.7 0.1 - 0.9 x10E3/uL   EOS (ABSOLUTE) 0.2 0.0 - 0.4 x10E3/uL   Basophils Absolute 0.1 0.0 - 0.2 x10E3/uL   Immature Granulocytes 0 Not Estab. %   Immature Grans (Abs) 0.0 0.0 - 0.1 x10E3/uL  UA/M w/rflx Culture, Routine   Specimen: Urine   URINE  Result Value Ref Range   Specific Gravity, UA 1.010 1.005 - 1.030   pH, UA 5.0 5.0 - 7.5   Color, UA Yellow Yellow   Appearance Ur Clear Clear   Leukocytes,UA Trace (A) Negative   Protein,UA Negative Negative/Trace   Glucose, UA 3+ (A) Negative   Ketones, UA Negative Negative   RBC, UA Negative Negative   Bilirubin, UA Negative Negative   Urobilinogen, Ur 0.2 0.2 - 1.0 mg/dL   Nitrite, UA Negative Negative   Microscopic Examination See below:    Urinalysis Reflex Comment   TSH  Result Value Ref Range   TSH 1.010 0.450 - 4.500 uIU/mL  Microalbumin, Urine Waived  Result Value Ref Range   Microalb, Ur Waived 10 0 - 19 mg/L   Creatinine, Urine Waived 50 10 - 300 mg/dL   Microalb/Creat Ratio <30 <30 mg/g  Lipid Panel w/o Chol/HDL Ratio  Result Value Ref Range   Cholesterol, Total 172 100 - 199 mg/dL   Triglycerides 409117 0 - 149 mg/dL   HDL 54 >81>39 mg/dL   VLDL Cholesterol Cal 23 5 - 40 mg/dL   LDL Calculated 95 0 - 99 mg/dL  Comprehensive metabolic panel  Result Value Ref Range   Glucose 88 65 - 99 mg/dL   BUN 17 8 - 27 mg/dL   Creatinine, Ser 1.910.50 (L) 0.57 - 1.00 mg/dL   GFR calc non Af Amer 105 >59 mL/min/1.73   GFR calc Af Amer 121 >59 mL/min/1.73    BUN/Creatinine Ratio 34 (H) 12 - 28   Sodium 142 134 - 144 mmol/L   Potassium 4.8 3.5 - 5.2 mmol/L   Chloride 101 96 - 106 mmol/L   CO2 25 20 - 29 mmol/L   Calcium 10.0 8.7 - 10.3 mg/dL   Total Protein 7.2 6.0 - 8.5 g/dL   Albumin 4.8 3.8 - 4.8 g/dL   Globulin, Total 2.4 1.5 - 4.5 g/dL   Albumin/Globulin Ratio 2.0 1.2 - 2.2   Bilirubin Total 0.4 0.0 - 1.2 mg/dL   Alkaline Phosphatase 79 39 - 117 IU/L   AST 22 0 - 40 IU/L   ALT 20 0 - 32 IU/L  Bayer DCA Hb A1c Waived  Result Value Ref Range   HB A1C (BAYER DCA - WAIVED) 8.0 (H) <7.0 %      Assessment & Plan:   Problem List Items Addressed This Visit      Endocrine   DM (diabetes mellitus), type 2 (HCC) - Primary    AM sugars have been running below 150. Will get her in for A1c ASAP. Continue current regimen. Will adjust medication as needed. Call with any concerns.       Relevant Orders   Bayer DCA Hb A1c Waived     Other   Anxiety    In acute exacerbation. Will restart her lexapro and her lorazepam PRN. Recheck 4 weeks. Call with any concerns.        Other Visit Diagnoses    Vertigo       Will start epley's manuver. Continue  meclizine. Call with any concerns or if not getting better.        Follow up plan: Return in about 4 weeks (around 07/23/2019) for follow up mood.    . This visit was completed via [employer] due to the restrictions of the COVID-19 pandemic. All issues as above were discussed and addressed. Physical exam was done as above through visual confirmation on D[employer]. If it was felt that the patient should be evaluated in the office, they were directed there. The patient verbally consented to this visit. . Location of the patient: home . Location of the provider: home . Those involved with this call:  . Provider: Olevia PerchesMegan Amma Crear, DO . CMA: Tiffany Reel, CMA . Front Desk/Registration: Adela Portshristan Williamson  . Time spent on call: 25 minutes with patient face to face via video conference. More than 50% of  this time was spent in counseling and coordination of care. 40 minutes total spent in review of patient's record and preparation of their chart.

## 2019-06-25 NOTE — Assessment & Plan Note (Signed)
AM sugars have been running below 150. Will get her in for A1c ASAP. Continue current regimen. Will adjust medication as needed. Call with any concerns.

## 2019-06-25 NOTE — Patient Instructions (Signed)

## 2019-06-25 NOTE — Assessment & Plan Note (Signed)
In acute exacerbation. Will restart her lexapro and her lorazepam PRN. Recheck 4 weeks. Call with any concerns.

## 2019-06-27 ENCOUNTER — Other Ambulatory Visit: Payer: Self-pay

## 2019-06-27 ENCOUNTER — Other Ambulatory Visit: Payer: BC Managed Care – PPO

## 2019-06-27 DIAGNOSIS — E1129 Type 2 diabetes mellitus with other diabetic kidney complication: Secondary | ICD-10-CM

## 2019-06-27 LAB — BAYER DCA HB A1C WAIVED: HB A1C (BAYER DCA - WAIVED): 5.6 % (ref <7.0)

## 2019-08-07 ENCOUNTER — Other Ambulatory Visit: Payer: Self-pay | Admitting: Family Medicine

## 2019-08-07 NOTE — Telephone Encounter (Signed)
Requested medication (s) are due for refill today: yes  Requested medication (s) are on the active medication list: yes  Last refill:  06/25/2019  Future visit scheduled: yes  Notes to clinic: refill cannot be delegated    Requested Prescriptions  Pending Prescriptions Disp Refills   LORazepam (ATIVAN) 0.5 MG tablet [Pharmacy Med Name: LORAZEPAM 0.5MG  TABLETS] 20 tablet     Sig: TAKE 1 TABLET(0.5 MG) BY MOUTH TWICE DAILY AS NEEDED FOR ANXIETY     Not Delegated - Psychiatry:  Anxiolytics/Hypnotics Failed - 08/07/2019 10:22 AM      Failed - This refill cannot be delegated      Failed - Urine Drug Screen completed in last 360 days.      Passed - Valid encounter within last 6 months    Recent Outpatient Visits          1 month ago Type 2 diabetes mellitus with microalbuminuria, without long-term current use of insulin ([place])   [place], Megan P, DO   5 months ago Type 2 diabetes mellitus with microalbuminuria, without long-term current use of insulin ([place])   [facility], Megan P, DO   7 months ago Type 2 diabetes mellitus with microalbuminuria, without long-term current use of insulin ([place])   [facility], Megan P, DO   8 months ago Type 2 diabetes mellitus with microalbuminuria, without long-term current use of insulin ([place])   [place], Megan P, DO   1 year ago Essential hypertension   [place], Barb Merino, DO      Future Appointments            Tomorrow Valerie Roys, DO [place], [facility]

## 2019-08-07 NOTE — Telephone Encounter (Signed)
Routing to provider  

## 2019-08-08 ENCOUNTER — Other Ambulatory Visit: Payer: Self-pay

## 2019-08-08 ENCOUNTER — Ambulatory Visit (INDEPENDENT_AMBULATORY_CARE_PROVIDER_SITE_OTHER): Payer: BC Managed Care – PPO | Admitting: Family Medicine

## 2019-08-08 VITALS — BP 113/69 | HR 73 | Temp 98.3°F | Ht 62.0 in | Wt 130.0 lb

## 2019-08-08 DIAGNOSIS — Z23 Encounter for immunization: Secondary | ICD-10-CM | POA: Diagnosis not present

## 2019-08-08 DIAGNOSIS — F419 Anxiety disorder, unspecified: Secondary | ICD-10-CM | POA: Diagnosis not present

## 2019-08-08 DIAGNOSIS — Z0000 Encounter for general adult medical examination without abnormal findings: Secondary | ICD-10-CM | POA: Diagnosis not present

## 2019-08-08 DIAGNOSIS — E782 Mixed hyperlipidemia: Secondary | ICD-10-CM

## 2019-08-08 DIAGNOSIS — K219 Gastro-esophageal reflux disease without esophagitis: Secondary | ICD-10-CM | POA: Diagnosis not present

## 2019-08-08 DIAGNOSIS — R809 Proteinuria, unspecified: Secondary | ICD-10-CM | POA: Diagnosis not present

## 2019-08-08 DIAGNOSIS — I129 Hypertensive chronic kidney disease with stage 1 through stage 4 chronic kidney disease, or unspecified chronic kidney disease: Secondary | ICD-10-CM | POA: Diagnosis not present

## 2019-08-08 DIAGNOSIS — E1129 Type 2 diabetes mellitus with other diabetic kidney complication: Secondary | ICD-10-CM | POA: Diagnosis not present

## 2019-08-08 NOTE — Progress Notes (Signed)
BP 113/69   Pulse 73   Temp 98.3 F (36.8 C) (Oral)   Ht _0  (1.575 m)   Wt 130 lb (59 kg)   SpO2 98%   BMI 23.78 kg/m    Subjective:    Patient ID: Brianna Leon, female    DOB: [birth year], 63 y.o.   MRN: [id number]  HPI: Brianna Leon is a 63 y.o. female presenting on 08/08/2019 for comprehensive medical examination. Current medical complaints include:  HYPERTENSION / HYPERLIPIDEMIA Satisfied with current treatment? yes Duration of hypertension: chronic BP monitoring frequency: not checking BP medication side effects: no Past BP meds: none Duration of hyperlipidemia: chronic Cholesterol medication side effects: no Cholesterol supplements: none Past cholesterol medications: simvastatin Medication compliance: excellent compliance Aspirin: yes Recent stressors: no Recurrent headaches: no Visual changes: no Palpitations: no Dyspnea: no Chest pain: no Lower extremity edema: no Dizzy/lightheaded: no  DIABETES Hypoglycemic episodes:no Polydipsia/polyuria: no Visual disturbance: no Chest pain: no Paresthesias: no Glucose Monitoring: no  Accucheck frequency: Not Checking Taking Insulin?: no Blood Pressure Monitoring: not checking Retinal Examination: Up to Date Foot Exam: Up to Date Diabetic Education: Completed Pneumovax: Up to Date Influenza: Up to Date Aspirin: yes  ANXIETY/STRESS- Mom has been really sick Duration:uncontrolled Anxious mood: yes  Excessive worrying: yes Irritability: no  Sweating: no Nausea: no Palpitations:no Hyperventilation: no Panic attacks: no Agoraphobia: no  Obscessions/compulsions: no Depressed mood: yes Depression screen [facility] 2/9 06/25/2019 03/07/2019 01/03/2019 08/02/2018 05/17/2018  Decreased Interest 0 0 0 1 0  Down, Depressed, Hopeless 1 0 0 1 0  PHQ - 2 Score 1 0 0 2 0  Altered sleeping 3 0 0 2 2  Tired, decreased energy 3 0 0 2 1  Change in appetite 2 0 0 2 1  Feeling bad or failure about yourself  0 0 0 1 0   Trouble concentrating 0 0 0 1 0  Moving slowly or fidgety/restless 3 0 0 0 0  Suicidal thoughts 0 0 0 0 0  PHQ-9 Score 12 0 0 10 4  Difficult doing work/chores Very difficult - Not difficult at all Not difficult at all Somewhat difficult   Anhedonia: no Weight changes: yes Insomnia: no   Hypersomnia: no Fatigue/loss of energy: yes Feelings of worthlessness: no Feelings of guilt: no Impaired concentration/indecisiveness: no Suicidal ideations: no  Crying spells: no Recent Stressors/Life Changes: yes   Relationship problems: no   Family stress: yes     Financial stress: no    Job stress: no    Recent death/loss: no  Menopausal Symptoms: no  Depression Screen done today and results listed below:  Depression screen [facility] 2/9 06/25/2019 03/07/2019 01/03/2019 08/02/2018 05/17/2018  Decreased Interest 0 0 0 1 0  Down, Depressed, Hopeless 1 0 0 1 0  PHQ - 2 Score 1 0 0 2 0  Altered sleeping 3 0 0 2 2  Tired, decreased energy 3 0 0 2 1  Change in appetite 2 0 0 2 1  Feeling bad or failure about yourself  0 0 0 1 0  Trouble concentrating 0 0 0 1 0  Moving slowly or fidgety/restless 3 0 0 0 0  Suicidal thoughts 0 0 0 0 0  PHQ-9 Score 12 0 0 10 4  Difficult doing work/chores Very difficult - Not difficult at all Not difficult at all Somewhat difficult    Past Medical History:  Past Medical History:  Diagnosis Date  . Arthritis    KNEES  .  Biliary colic 1/79/1505  . CAD (coronary artery disease)   . DM (diabetes mellitus), type 2 ([place]) 2000   insulin started 2016  . Elevated liver enzymes   . Family history of colonic polyps    father  . GERD (gastroesophageal reflux disease)   . Hemorrhage    DURING PREGNANCY 31 YEARS AGO  . Hyperlipidemia   . IBS (irritable bowel syndrome)   . Migraine   . RUQ pain 06/12/2017    Surgical History:  Past Surgical History:  Procedure Laterality Date  . ABDOMINAL HYSTERECTOMY  22 years ago  . CARDIAC CATHETERIZATION     x 2  . CARPAL  TUNNEL RELEASE Right   . CHOLECYSTECTOMY N/A 09/20/2017   Procedure: LAPAROSCOPIC CHOLECYSTECTOMY WITH INTRAOPERATIVE CHOLANGIOGRAM;  Surgeon: Robert Bellow, MD;  Location: [facility] ORS;  Service: General;  Laterality: N/A;  . COLONOSCOPY  05/18/2015   repeat 5 years  . ESOPHAGOGASTRODUODENOSCOPY  05/18/2015  . KNEE ARTHROSCOPY  2001 and 2009  . TONSILLECTOMY      Medications:  Current Outpatient Medications on File Prior to Visit  Medication Sig  . Ascorbic Acid (CHEWABLE VITAMIN C PO) Take by mouth daily.  Marland Kitchen aspirin EC 81 MG tablet Take 81 mg by mouth daily.  Marland Kitchen aspirin-acetaminophen-caffeine (EXCEDRIN MIGRAINE) 250-250-65 MG tablet Take 2 tablets by mouth every 6 (six) hours as needed for headache.  . blood glucose meter kit and supplies KIT Dispense based on patient and insurance preference. Use up to four times daily as directed. (FOR ICD-9 250.00, 250.01).  Marland Kitchen EPINEPHrine 0.3 mg/0.3 mL IJ SOAJ injection INJECT INTRAMUSCULARLY AS DIRECTED  . Multiple Vitamins-Minerals (OCUVITE ADULT 50+ PO) Take 1 capsule by mouth daily.  . Multiple Vitamins-Minerals (ZINC PO) Take by mouth daily.  Marland Kitchen OVER THE COUNTER MEDICATION Take 2 capsules by mouth daily. Hearing Health Supplement  . UNABLE TO FIND Take by mouth daily. Vitamin D3, vitamin B, Bioflex  . UNABLE TO FIND Take by mouth daily. Ocutive eye vitamins, cinnamon vitamins   No current facility-administered medications on file prior to visit.     Allergies:  Allergies  Allergen Reactions  . Carafate [Sucralfate] Anaphylaxis and Hives  . Atorvastatin Other (See Comments)    Unknown  . Metformin And Related Diarrhea  . Other Hives and Other (See Comments)    Lysol Cleaning Products  . Penicillins Nausea And Vomiting  . Tramadol Nausea Only    Social History:  Social History   Socioeconomic History  . Marital status: Married    Spouse name: Not on file  . Number of children: Not on file  . Years of education: Not on file  .  Highest education level: Not on file  Occupational History  . Not on file  Social Needs  . Financial resource strain: Not on file  . Food insecurity    Worry: Not on file    Inability: Not on file  . Transportation needs    Medical: Not on file    Non-medical: Not on file  Tobacco Use  . Smoking status: Never Smoker  . Smokeless tobacco: Never Used  Substance and Sexual Activity  . Alcohol use: No  . Drug use: No  . Sexual activity: Yes  Lifestyle  . Physical activity    Days per week: Not on file    Minutes per session: Not on file  . Stress: Not on file  Relationships  . Social connections    Talks on phone: Not on file  Gets together: Not on file    Attends religious service: Not on file    Active member of club or organization: Not on file    Attends meetings of clubs or organizations: Not on file    Relationship status: Not on file  . Intimate partner violence    Fear of current or ex partner: Not on file    Emotionally abused: Not on file    Physically abused: Not on file    Forced sexual activity: Not on file  Other Topics Concern  . Not on file  Social History Narrative  . Not on file   Social History   Tobacco Use  Smoking Status Never Smoker  Smokeless Tobacco Never Used   Social History   Substance and Sexual Activity  Alcohol Use No    Family History:  Family History  Problem Relation Age of Onset  . Colon polyps Father   . Diabetes Father   . Hypertension Father   . Heart disease Father   . Stroke Father   . Diabetes Mother   . Hypertension Mother   . Diabetes Brother   . Heart failure Brother        pacemaker  . Hypertension Brother   . Heart attack Brother   . Migraines Daughter   . Lupus Son   . Diabetes Maternal Grandmother   . Heart disease Maternal Grandmother   . Stroke Maternal Grandfather   . Hypertension Paternal Grandmother   . Stroke Paternal Grandfather   . Hyperlipidemia Brother   . Breast cancer Neg Hx      Past medical history, surgical history, medications, allergies, family history and social history reviewed with patient today and changes made to appropriate areas of the chart.   Review of Systems  Constitutional: Negative.   HENT: Negative.   Eyes: Negative.   Respiratory: Negative.   Cardiovascular: Negative.   Gastrointestinal: Positive for abdominal pain and constipation (a couple months). Negative for blood in stool, diarrhea, heartburn, melena, nausea and vomiting.  Genitourinary: Negative.   Musculoskeletal: Negative.   Skin: Negative.   Neurological: Negative.   Endo/Heme/Allergies: Negative for environmental allergies and polydipsia. Bruises/bleeds easily.  Psychiatric/Behavioral: Negative.     All other ROS negative except what is listed above and in the HPI.      Objective:    BP 113/69   Pulse 73   Temp 98.3 F (36.8 C) (Oral)   Ht _0  (1.575 m)   Wt 130 lb (59 kg)   SpO2 98%   BMI 23.78 kg/m   Wt Readings from Last 3 Encounters:  08/08/19 130 lb (59 kg)  03/07/19 120 lb 9.6 oz (54.7 kg)  01/03/19 123 lb (55.8 kg)    Physical Exam Vitals signs and nursing note reviewed.  Constitutional:      General: She is not in acute distress.    Appearance: Normal appearance. She is not ill-appearing, toxic-appearing or diaphoretic.  HENT:     Head: Normocephalic and atraumatic.     Right Ear: Tympanic membrane, ear canal and external ear normal. There is no impacted cerumen.     Left Ear: Tympanic membrane, ear canal and external ear normal. There is no impacted cerumen.     Nose: Nose normal. No congestion or rhinorrhea.     Mouth/Throat:     Mouth: Mucous membranes are moist.     Pharynx: Oropharynx is clear. No oropharyngeal exudate or posterior oropharyngeal erythema.  Eyes:     General:  No scleral icterus.       Right eye: No discharge.        Left eye: No discharge.     Extraocular Movements: Extraocular movements intact.     Conjunctiva/sclera:  Conjunctivae normal.     Pupils: Pupils are equal, round, and reactive to light.  Neck:     Musculoskeletal: Normal range of motion and neck supple. No neck rigidity or muscular tenderness.     Vascular: No carotid bruit.  Cardiovascular:     Rate and Rhythm: Normal rate and regular rhythm.     Pulses: Normal pulses.     Heart sounds: No murmur. No friction rub. No gallop.   Pulmonary:     Effort: Pulmonary effort is normal. No respiratory distress.     Breath sounds: Normal breath sounds. No stridor. No wheezing, rhonchi or rales.  Chest:     Chest wall: No tenderness.  Abdominal:     General: Abdomen is flat. Bowel sounds are normal. There is no distension.     Palpations: Abdomen is soft. There is no mass.     Tenderness: There is no abdominal tenderness. There is no right CVA tenderness, left CVA tenderness, guarding or rebound.     Hernia: No hernia is present.  Genitourinary:    Comments: Breast and pelvic exams deferred with shared decision making Musculoskeletal:        General: No swelling, tenderness, deformity or signs of injury.     Right lower leg: No edema.     Left lower leg: No edema.  Lymphadenopathy:     Cervical: No cervical adenopathy.  Skin:    General: Skin is warm and dry.     Capillary Refill: Capillary refill takes less than 2 seconds.     Coloration: Skin is not jaundiced or pale.     Findings: No bruising, erythema, lesion or rash.  Neurological:     General: No focal deficit present.     Mental Status: She is alert and oriented to person, place, and time. Mental status is at baseline.     Cranial Nerves: No cranial nerve deficit.     Sensory: No sensory deficit.     Motor: No weakness.     Coordination: Coordination normal.     Gait: Gait normal.     Deep Tendon Reflexes: Reflexes normal.  Psychiatric:        Mood and Affect: Mood normal.        Behavior: Behavior normal.        Thought Content: Thought content normal.        Judgment: Judgment  normal.     Results for orders placed or performed in visit on 08/08/19  Bayer DCA Hb A1c Waived  Result Value Ref Range   HB A1C (BAYER DCA - WAIVED) 5.4 <7.0 %  CBC with Differential/Platelet  Result Value Ref Range   WBC 8.0 3.4 - 10.8 x10E3/uL   RBC 4.44 3.77 - 5.28 x10E6/uL   Hemoglobin 14.3 11.1 - 15.9 g/dL   Hematocrit 41.9 34.0 - 46.6 %   MCV 94 79 - 97 fL   MCH 32.2 26.6 - 33.0 pg   MCHC 34.1 31.5 - 35.7 g/dL   RDW 11.7 11.7 - 15.4 %   Platelets 230 150 - 450 x10E3/uL   Neutrophils 46 Not Estab. %   Lymphs 42 Not Estab. %   Monocytes 8 Not Estab. %   Eos 3 Not Estab. %   Basos 1  Not Estab. %   Neutrophils Absolute 3.7 1.4 - 7.0 x10E3/uL   Lymphocytes Absolute 3.4 (H) 0.7 - 3.1 x10E3/uL   Monocytes Absolute 0.7 0.1 - 0.9 x10E3/uL   EOS (ABSOLUTE) 0.2 0.0 - 0.4 x10E3/uL   Basophils Absolute 0.1 0.0 - 0.2 x10E3/uL   Immature Granulocytes 0 Not Estab. %   Immature Grans (Abs) 0.0 0.0 - 0.1 x10E3/uL  Comprehensive metabolic panel  Result Value Ref Range   Glucose 76 65 - 99 mg/dL   BUN 13 8 - 27 mg/dL   Creatinine, Ser 0.59 0.57 - 1.00 mg/dL   GFR calc non Af Amer 99 >59 mL/min/1.73   GFR calc Af Amer 114 >59 mL/min/1.73   BUN/Creatinine Ratio 22 12 - 28   Sodium 142 134 - 144 mmol/L   Potassium 4.8 3.5 - 5.2 mmol/L   Chloride 101 96 - 106 mmol/L   CO2 24 20 - 29 mmol/L   Calcium 9.6 8.7 - 10.3 mg/dL   Total Protein 7.4 6.0 - 8.5 g/dL   Albumin 4.9 (H) 3.8 - 4.8 g/dL   Globulin, Total 2.5 1.5 - 4.5 g/dL   Albumin/Globulin Ratio 2.0 1.2 - 2.2   Bilirubin Total 0.4 0.0 - 1.2 mg/dL   Alkaline Phosphatase 57 39 - 117 IU/L   AST 23 0 - 40 IU/L   ALT 20 0 - 32 IU/L  Lipid Panel w/o Chol/HDL Ratio  Result Value Ref Range   Cholesterol, Total 157 100 - 199 mg/dL   Triglycerides 91 0 - 149 mg/dL   HDL 45 >39 mg/dL   VLDL Cholesterol Cal 17 5 - 40 mg/dL   LDL Chol Calc (NIH) 95 0 - 99 mg/dL  Microalbumin, Urine Waived  Result Value Ref Range   Microalb, Ur  Waived 10 0 - 19 mg/L   Creatinine, Urine Waived 100 10 - 300 mg/dL   Microalb/Creat Ratio <30 <30 mg/g  TSH  Result Value Ref Range   TSH 1.490 0.450 - 4.500 uIU/mL  UA/M w/rflx Culture, Routine   Specimen: Blood   BLD  Result Value Ref Range   Specific Gravity, UA 1.025 1.005 - 1.030   pH, UA 5.0 5.0 - 7.5   Color, UA Yellow Yellow   Appearance Ur Clear Clear   Leukocytes,UA Negative Negative   Protein,UA Negative Negative/Trace   Glucose, UA 2+ (A) Negative   Ketones, UA Negative Negative   RBC, UA Negative Negative   Bilirubin, UA Negative Negative   Urobilinogen, Ur 0.2 0.2 - 1.0 mg/dL   Nitrite, UA Negative Negative      Assessment & Plan:   Problem List Items Addressed This Visit      Digestive   GERD (gastroesophageal reflux disease)    Under good control on current regimen. Continue current regimen. Continue to monitor. Call with any concerns. Refills given.        Relevant Orders   CBC with Differential/Platelet (Completed)   Comprehensive metabolic panel (Completed)   TSH (Completed)   UA/M w/rflx Culture, Routine (Completed)     Endocrine   DM (diabetes mellitus), type 2 ([place])    Under good control with A1c of 5.4- will decrease her trulicity to 4.40NU and recheck in 3 months. Call with any concerns.      Relevant Medications   Dulaglutide (TRULICITY) 2.72 ZD/6.6YQ SOPN   simvastatin (ZOCOR) 40 MG tablet   empagliflozin (JARDIANCE) 25 MG TABS tablet   Other Relevant Orders   Bayer DCA Hb A1c Waived (  Completed)   CBC with Differential/Platelet (Completed)   Comprehensive metabolic panel (Completed)   Microalbumin, Urine Waived (Completed)   TSH (Completed)   UA/M w/rflx Culture, Routine (Completed)     Genitourinary   Benign hypertensive renal disease    Under good control on current regimen. Continue current regimen. Continue to monitor. Call with any concerns. Refills given. Labs drawn today.       Relevant Orders   CBC with  Differential/Platelet (Completed)   Comprehensive metabolic panel (Completed)   Microalbumin, Urine Waived (Completed)   TSH (Completed)   UA/M w/rflx Culture, Routine (Completed)     Other   Hyperlipidemia    Under good control on current regimen. Continue current regimen. Continue to monitor. Call with any concerns. Refills given. Labs drawn today.       Relevant Medications   simvastatin (ZOCOR) 40 MG tablet   Other Relevant Orders   CBC with Differential/Platelet (Completed)   Comprehensive metabolic panel (Completed)   Lipid Panel w/o Chol/HDL Ratio (Completed)   TSH (Completed)   UA/M w/rflx Culture, Routine (Completed)   Anxiety    Not under good control, has a lot of family stress going on. Will increase her lexapro and refill her ativan. Call with any concerns. Continue to monitor.       Relevant Medications   LORazepam (ATIVAN) 0.5 MG tablet   escitalopram (LEXAPRO) 20 MG tablet   Other Relevant Orders   CBC with Differential/Platelet (Completed)   Comprehensive metabolic panel (Completed)   TSH (Completed)   UA/M w/rflx Culture, Routine (Completed)    Other Visit Diagnoses    Routine general medical examination at a health care facility    -  Primary   Vaccines up to date. Screening labs checked today. Pap N/A. Mammogram ordered. Colonoscopy up to date. Continue diet and exercise. Call with any concerns.    Flu vaccine need       Flu shot given today.    Relevant Orders   Flu Vaccine QUAD 36+ mos IM (Completed)       Follow up plan: Return in about 3 months (around 11/08/2019).   LABORATORY TESTING:  - Pap smear: not applicable  IMMUNIZATIONS:   - Tdap: Tetanus vaccination status reviewed: last tetanus booster within 10 years. - Influenza: Up to date - Pneumovax: Up to date  SCREENING: -Mammogram: Up to date  - Colonoscopy: Up to date   PATIENT COUNSELING:   Advised to take 1 mg of folate supplement per day if capable of pregnancy.   Sexuality:  Discussed sexually transmitted diseases, partner selection, use of condoms, avoidance of unintended pregnancy  and contraceptive alternatives.   Advised to avoid cigarette smoking.  I discussed with the patient that most people either abstain from alcohol or drink within safe limits (<=14/week and <=4 drinks/occasion for males, <=7/weeks and <= 3 drinks/occasion for females) and that the risk for alcohol disorders and other health effects rises proportionally with the number of drinks per week and how often a drinker exceeds daily limits.  Discussed cessation/primary prevention of drug use and availability of treatment for abuse.   Diet: Encouraged to adjust caloric intake to maintain  or achieve ideal body weight, to reduce intake of dietary saturated fat and total fat, to limit sodium intake by avoiding high sodium foods and not adding table salt, and to maintain adequate dietary potassium and calcium preferably from fresh fruits, vegetables, and low-fat dairy products.    stressed the importance of regular exercise  Injury prevention: Discussed safety belts, safety helmets, smoke detector, smoking near bedding or upholstery.   Dental health: Discussed importance of regular tooth brushing, flossing, and dental visits.    NEXT PREVENTATIVE PHYSICAL DUE IN 1 YEAR. Return in about 3 months (around 11/08/2019).

## 2019-08-09 LAB — MICROALBUMIN, URINE WAIVED
Creatinine, Urine Waived: 100 mg/dL (ref 10–300)
Microalb, Ur Waived: 10 mg/L (ref 0–19)
Microalb/Creat Ratio: <30 mg/g (ref <30)

## 2019-08-09 LAB — UA/M W/RFLX CULTURE, ROUTINE
Bilirubin, UA: NEGATIVE
Ketones, UA: NEGATIVE
Leukocytes,UA: NEGATIVE
Nitrite, UA: NEGATIVE
Protein,UA: NEGATIVE
RBC, UA: NEGATIVE
Specific Gravity, UA: 1.025 (ref 1.005–1.030)
Urobilinogen, Ur: 0.2 mg/dL (ref 0.2–1.0)
pH, UA: 5 (ref 5.0–7.5)

## 2019-08-09 LAB — CBC WITH DIFFERENTIAL/PLATELET
Basophils Absolute: 0.1 10*3/uL (ref 0.0–0.2)
Basos: 1 %
EOS (ABSOLUTE): 0.2 10*3/uL (ref 0.0–0.4)
Eos: 3 %
Hematocrit: 41.9 % (ref 34.0–46.6)
Hemoglobin: 14.3 g/dL (ref 11.1–15.9)
Immature Grans (Abs): 0 10*3/uL (ref 0.0–0.1)
Immature Granulocytes: 0 %
Lymphocytes Absolute: 3.4 10*3/uL — ABNORMAL HIGH (ref 0.7–3.1)
Lymphs: 42 %
MCH: 32.2 pg (ref 26.6–33.0)
MCHC: 34.1 g/dL (ref 31.5–35.7)
MCV: 94 fL (ref 79–97)
Monocytes Absolute: 0.7 10*3/uL (ref 0.1–0.9)
Monocytes: 8 %
Neutrophils Absolute: 3.7 10*3/uL (ref 1.4–7.0)
Neutrophils: 46 %
Platelets: 230 10*3/uL (ref 150–450)
RBC: 4.44 x10E6/uL (ref 3.77–5.28)
RDW: 11.7 % (ref 11.7–15.4)
WBC: 8 10*3/uL (ref 3.4–10.8)

## 2019-08-09 LAB — COMPREHENSIVE METABOLIC PANEL
ALT: 20 IU/L (ref 0–32)
AST: 23 IU/L (ref 0–40)
Albumin/Globulin Ratio: 2 (ref 1.2–2.2)
Albumin: 4.9 g/dL — ABNORMAL HIGH (ref 3.8–4.8)
Alkaline Phosphatase: 57 IU/L (ref 39–117)
BUN/Creatinine Ratio: 22 (ref 12–28)
BUN: 13 mg/dL (ref 8–27)
Bilirubin Total: 0.4 mg/dL (ref 0.0–1.2)
CO2: 24 mmol/L (ref 20–29)
Calcium: 9.6 mg/dL (ref 8.7–10.3)
Chloride: 101 mmol/L (ref 96–106)
Creatinine, Ser: 0.59 mg/dL (ref 0.57–1.00)
GFR calc Af Amer: 114 mL/min/{1.73_m2} (ref >59)
GFR calc non Af Amer: 99 mL/min/{1.73_m2} (ref >59)
Globulin, Total: 2.5 g/dL (ref 1.5–4.5)
Glucose: 76 mg/dL (ref 65–99)
Potassium: 4.8 mmol/L (ref 3.5–5.2)
Sodium: 142 mmol/L (ref 134–144)
Total Protein: 7.4 g/dL (ref 6.0–8.5)

## 2019-08-09 LAB — LIPID PANEL W/O CHOL/HDL RATIO
Cholesterol, Total: 157 mg/dL (ref 100–199)
HDL: 45 mg/dL (ref >39)
LDL Chol Calc (NIH): 95 mg/dL (ref 0–99)
Triglycerides: 91 mg/dL (ref 0–149)
VLDL Cholesterol Cal: 17 mg/dL (ref 5–40)

## 2019-08-09 LAB — TSH: TSH: 1.49 u[IU]/mL (ref 0.450–4.500)

## 2019-08-09 LAB — BAYER DCA HB A1C WAIVED: HB A1C (BAYER DCA - WAIVED): 5.4 % (ref <7.0)

## 2019-08-10 ENCOUNTER — Telehealth: Payer: Self-pay | Admitting: Nurse Practitioner

## 2019-08-10 ENCOUNTER — Encounter: Payer: Self-pay | Admitting: Family Medicine

## 2019-08-10 MED ORDER — JARDIANCE 25 MG PO TABS: 25.0000 mg | ORAL_TABLET | Freq: Every day | ORAL | 1 refills | Status: DC

## 2019-08-10 MED ORDER — TRULICITY 0.75 MG/0.5ML ~~LOC~~ SOAJ: 0.7500 mg | SUBCUTANEOUS | 3 refills | Status: DC

## 2019-08-10 MED ORDER — ESCITALOPRAM OXALATE 20 MG PO TABS: 20.0000 mg | ORAL_TABLET | Freq: Every day | ORAL | 3 refills | Status: DC

## 2019-08-10 MED ORDER — SIMVASTATIN 40 MG PO TABS: 40.0000 mg | ORAL_TABLET | Freq: Every day | ORAL | 1 refills | Status: DC

## 2019-08-10 MED ORDER — LORAZEPAM 0.5 MG PO TABS: 0.5000 mg | ORAL_TABLET | Freq: Two times a day (BID) | ORAL | 0 refills | Status: DC | PRN

## 2019-08-10 MED ORDER — MECLIZINE HCL 12.5 MG PO TABS: 12.5000 mg | ORAL_TABLET | Freq: Three times a day (TID) | ORAL | 0 refills | Status: DC | PRN

## 2019-08-10 NOTE — Assessment & Plan Note (Signed)
Under good control on current regimen. Continue current regimen. Continue to monitor. Call with any concerns. Refills given. Labs drawn today.   

## 2019-08-10 NOTE — Assessment & Plan Note (Signed)
Not under good control, has a lot of family stress going on. Will increase her lexapro and refill her ativan. Call with any concerns. Continue to monitor.

## 2019-08-10 NOTE — Assessment & Plan Note (Signed)
Under good control with A1c of 5.4- will decrease her trulicity to 0.75mg  and recheck in 3 months. Call with any concerns.

## 2019-08-10 NOTE — Assessment & Plan Note (Signed)
Under good control on current regimen. Continue current regimen. Continue to monitor. Call with any concerns. Refills given.   

## 2019-08-10 NOTE — Telephone Encounter (Signed)
Nursing call team called, reported patient calling in with complaint of vertigo.  Similar to her past episodes.  On review last seen by Dr. Wynetta Emery for this 06/25/19 and instructed to continue Meclizine and perform Epley's Maneuver.  Patient reports she has not Meclizine at this time and inquired if she could have some sent in.  Nursing team reports patient was screened and having no stroke-like symptoms, no numbness, facial drooping, headache, loss of function, vision changes.  No issues with blood sugar reported.  Vertigo is similar to her previous on report to nursing staff.  Will send in Meclizine refill and have recommended if worsening today, immediately go to ER.  Have also instructed patient to follow-up with PCP within next 3 days.  Call team relayed this information to patient.

## 2019-09-12 ENCOUNTER — Ambulatory Visit (INDEPENDENT_AMBULATORY_CARE_PROVIDER_SITE_OTHER): Payer: BC Managed Care – PPO | Admitting: Family Medicine

## 2019-09-12 ENCOUNTER — Encounter: Payer: Self-pay | Admitting: Family Medicine

## 2019-09-12 ENCOUNTER — Other Ambulatory Visit: Payer: Self-pay

## 2019-09-12 DIAGNOSIS — F419 Anxiety disorder, unspecified: Secondary | ICD-10-CM

## 2019-09-12 MED ORDER — LORAZEPAM 0.5 MG PO TABS: 0.5000 mg | ORAL_TABLET | Freq: Two times a day (BID) | ORAL | 1 refills | Status: DC | PRN

## 2019-09-12 MED ORDER — ESCITALOPRAM OXALATE 20 MG PO TABS: 20.0000 mg | ORAL_TABLET | Freq: Every day | ORAL | 1 refills | Status: DC

## 2019-09-12 NOTE — Progress Notes (Signed)
BP 128/64   Pulse 74   Temp 98.6 F (37 C) (Oral)   Wt 128 lb (58.1 kg)   SpO2 98%   BMI 23.41 kg/m    Subjective:    Patient ID: Brianna Leon, female    DOB: 1956-06-22, 63 y.o.   MRN: [id number]  HPI: Brianna Leon is a 63 y.o. female  Chief Complaint  Patient presents with  . Depression    4 week f/up   DEPRESSION- Stress still seems to be pretty bad. Her aunt passed away and her daughter in law's mother passed Mood status: exacerbated Satisfied with current treatment?: yes Symptom severity: moderate  Duration of current treatment : 1 month on higher dose Side effects: no Medication compliance: excellent compliance Psychotherapy/counseling: no  Previous psychiatric medications: lexapro, ativan Depressed mood: yes Anxious mood: yes Anhedonia: no Significant weight loss or gain: no Insomnia: no  Fatigue: yes Feelings of worthlessness or guilt: yes Impaired concentration/indecisiveness: no Suicidal ideations: no Hopelessness: yes Crying spells: yes Depression screen [facility] 2/9 09/12/2019 06/25/2019 03/07/2019 01/03/2019 08/02/2018  Decreased Interest 2 0 0 0 1  Down, Depressed, Hopeless 0 1 0 0 1  PHQ - 2 Score 2 1 0 0 2  Altered sleeping 1 3 0 0 2  Tired, decreased energy 1 3 0 0 2  Change in appetite 0 2 0 0 2  Feeling bad or failure about yourself  0 0 0 0 1  Trouble concentrating 0 0 0 0 1  Moving slowly or fidgety/restless 0 3 0 0 0  Suicidal thoughts 0 0 0 0 0  PHQ-9 Score 4 12 0 0 10  Difficult doing work/chores Somewhat difficult Very difficult - Not difficult at all Not difficult at all   GAD 7 : Generalized Anxiety Score 09/12/2019 06/25/2019 03/07/2019 08/02/2018  Nervous, Anxious, on Edge 2 3 0 1  Control/stop worrying 0 3 0 1  Worry too much - different things 0 3 0 2  Trouble relaxing 1 3 0 1  Restless 0 3 0 0  Easily annoyed or irritable 2 3 0 2  Afraid - awful might happen - 3 0 0  Total GAD 7 Score - 21 0 7  Anxiety Difficulty Somewhat  difficult Somewhat difficult - -    Relevant past medical, surgical, family and social history reviewed and updated as indicated. Interim medical history since our last visit reviewed. Allergies and medications reviewed and updated.  Review of Systems  Constitutional: Negative.   Respiratory: Negative.   Cardiovascular: Negative.   Musculoskeletal: Negative.   Skin: Negative.   Psychiatric/Behavioral: Positive for dysphoric mood. Negative for agitation, behavioral problems, confusion, decreased concentration, hallucinations, self-injury, sleep disturbance and suicidal ideas. The patient is nervous/anxious. The patient is not hyperactive.     Per HPI unless specifically indicated above     Objective:    BP 128/64   Pulse 74   Temp 98.6 F (37 C) (Oral)   Wt 128 lb (58.1 kg)   SpO2 98%   BMI 23.41 kg/m   Wt Readings from Last 3 Encounters:  09/12/19 128 lb (58.1 kg)  08/08/19 130 lb (59 kg)  03/07/19 120 lb 9.6 oz (54.7 kg)    Physical Exam Vitals signs and nursing note reviewed.  Constitutional:      General: She is not in acute distress.    Appearance: Normal appearance. She is not ill-appearing, toxic-appearing or diaphoretic.  HENT:     Head: Normocephalic and atraumatic.  Right Ear: External ear normal.     Left Ear: External ear normal.     Nose: Nose normal.     Mouth/Throat:     Mouth: Mucous membranes are moist.     Pharynx: Oropharynx is clear.  Eyes:     General: No scleral icterus.       Right eye: No discharge.        Left eye: No discharge.     Conjunctiva/sclera: Conjunctivae normal.     Pupils: Pupils are equal, round, and reactive to light.  Neck:     Musculoskeletal: Normal range of motion.  Pulmonary:     Effort: Pulmonary effort is normal. No respiratory distress.     Comments: Speaking in full sentences Musculoskeletal: Normal range of motion.  Skin:    Coloration: Skin is not jaundiced or pale.     Findings: No bruising, erythema,  lesion or rash.  Neurological:     Mental Status: She is alert and oriented to person, place, and time. Mental status is at baseline.  Psychiatric:        Mood and Affect: Mood normal.        Behavior: Behavior normal.        Thought Content: Thought content normal.        Judgment: Judgment normal.     Results for orders placed or performed in visit on 08/08/19  Bayer DCA Hb A1c Waived  Result Value Ref Range   HB A1C (BAYER DCA - WAIVED) 5.4 <7.0 %  CBC with Differential/Platelet  Result Value Ref Range   WBC 8.0 3.4 - 10.8 x10E3/uL   RBC 4.44 3.77 - 5.28 x10E6/uL   Hemoglobin 14.3 11.1 - 15.9 g/dL   Hematocrit 19.141.9 47.834.0 - 46.6 %   MCV 94 79 - 97 fL   MCH 32.2 26.6 - 33.0 pg   MCHC 34.1 31.5 - 35.7 g/dL   RDW 29.511.7 62.111.7 - 30.815.4 %   Platelets 230 150 - 450 x10E3/uL   Neutrophils 46 Not Estab. %   Lymphs 42 Not Estab. %   Monocytes 8 Not Estab. %   Eos 3 Not Estab. %   Basos 1 Not Estab. %   Neutrophils Absolute 3.7 1.4 - 7.0 x10E3/uL   Lymphocytes Absolute 3.4 (H) 0.7 - 3.1 x10E3/uL   Monocytes Absolute 0.7 0.1 - 0.9 x10E3/uL   EOS (ABSOLUTE) 0.2 0.0 - 0.4 x10E3/uL   Basophils Absolute 0.1 0.0 - 0.2 x10E3/uL   Immature Granulocytes 0 Not Estab. %   Immature Grans (Abs) 0.0 0.0 - 0.1 x10E3/uL  Comprehensive metabolic panel  Result Value Ref Range   Glucose 76 65 - 99 mg/dL   BUN 13 8 - 27 mg/dL   Creatinine, Ser 6.570.59 0.57 - 1.00 mg/dL   GFR calc non Af Amer 99 >59 mL/min/1.73   GFR calc Af Amer 114 >59 mL/min/1.73   BUN/Creatinine Ratio 22 12 - 28   Sodium 142 134 - 144 mmol/L   Potassium 4.8 3.5 - 5.2 mmol/L   Chloride 101 96 - 106 mmol/L   CO2 24 20 - 29 mmol/L   Calcium 9.6 8.7 - 10.3 mg/dL   Total Protein 7.4 6.0 - 8.5 g/dL   Albumin 4.9 (H) 3.8 - 4.8 g/dL   Globulin, Total 2.5 1.5 - 4.5 g/dL   Albumin/Globulin Ratio 2.0 1.2 - 2.2   Bilirubin Total 0.4 0.0 - 1.2 mg/dL   Alkaline Phosphatase 57 39 - 117 IU/L   AST 23  0 - 40 IU/L   ALT 20 0 - 32 IU/L  Lipid  Panel w/o Chol/HDL Ratio  Result Value Ref Range   Cholesterol, Total 157 100 - 199 mg/dL   Triglycerides 91 0 - 149 mg/dL   HDL 45 >39 mg/dL   VLDL Cholesterol Cal 17 5 - 40 mg/dL   LDL Chol Calc (NIH) 95 0 - 99 mg/dL  Microalbumin, Urine Waived  Result Value Ref Range   Microalb, Ur Waived 10 0 - 19 mg/L   Creatinine, Urine Waived 100 10 - 300 mg/dL   Microalb/Creat Ratio <30 <30 mg/g  TSH  Result Value Ref Range   TSH 1.490 0.450 - 4.500 uIU/mL  UA/M w/rflx Culture, Routine   Specimen: Blood   BLD  Result Value Ref Range   Specific Gravity, UA 1.025 1.005 - 1.030   pH, UA 5.0 5.0 - 7.5   Color, UA Yellow Yellow   Appearance Ur Clear Clear   Leukocytes,UA Negative Negative   Protein,UA Negative Negative/Trace   Glucose, UA 2+ (A) Negative   Ketones, UA Negative Negative   RBC, UA Negative Negative   Bilirubin, UA Negative Negative   Urobilinogen, Ur 0.2 0.2 - 1.0 mg/dL   Nitrite, UA Negative Negative      Assessment & Plan:   Problem List Items Addressed This Visit      Other   Anxiety    Has lost 2 family members in 1 month. Has been doing OK, but it has been really rough. Offered referral to grief counselor, but she would like to hold off for now. Will continue current regimen. Continue to monitor. Call with any concerns.       Relevant Medications   LORazepam (ATIVAN) 0.5 MG tablet   escitalopram (LEXAPRO) 20 MG tablet       Follow up plan: Return in about 2 months (around 11/12/2019).   . This visit was completed via Doximity due to the restrictions of the COVID-19 pandemic. All issues as above were discussed and addressed. Physical exam was done as above through visual confirmation on Doximity. If it was felt that the patient should be evaluated in the office, they were directed there. The patient verbally consented to this visit. . Location of the patient: home . Location of the provider: home . Those involved with this call:  . Provider: Park Liter,  DO . CMA: Yvonna Alanis, [place] . Front Desk/Registration: Don Perking  . Time spent on call: 15 minutes with patient face to face via video conference. More than 50% of this time was spent in counseling and coordination of care. 23 minutes total spent in review of patient's record and preparation of their chart.

## 2019-09-12 NOTE — Assessment & Plan Note (Addendum)
Has lost 2 family members in 21 month. Has been doing OK, but it has been really rough. Offered referral to grief counselor, but she would like to hold off for now. Will continue current regimen. Continue to monitor. Call with any concerns.

## 2019-10-14 ENCOUNTER — Other Ambulatory Visit: Payer: Self-pay | Admitting: Family Medicine

## 2019-10-17 ENCOUNTER — Other Ambulatory Visit: Payer: Self-pay | Admitting: Family Medicine

## 2019-10-17 NOTE — Telephone Encounter (Signed)
Forwarding medication refill request to the PCP for review. 

## 2019-11-04 ENCOUNTER — Other Ambulatory Visit: Payer: Self-pay | Admitting: Family Medicine

## 2019-12-05 ENCOUNTER — Ambulatory Visit (INDEPENDENT_AMBULATORY_CARE_PROVIDER_SITE_OTHER): Payer: BC Managed Care – PPO | Admitting: Family Medicine

## 2019-12-05 ENCOUNTER — Encounter: Payer: Self-pay | Admitting: Family Medicine

## 2019-12-05 VITALS — BP 116/64 | HR 74 | Temp 98.6°F | Wt 131.0 lb

## 2019-12-05 DIAGNOSIS — E1129 Type 2 diabetes mellitus with other diabetic kidney complication: Secondary | ICD-10-CM

## 2019-12-05 DIAGNOSIS — F419 Anxiety disorder, unspecified: Secondary | ICD-10-CM | POA: Diagnosis not present

## 2019-12-05 NOTE — Assessment & Plan Note (Signed)
Under good control on current regimen- advised to take the 20mg  of the lexapro for a month. Continue current regimen. Continue to monitor. Call with any concerns. Refills given. Will call when she needs a refill on her lorazepam.

## 2019-12-05 NOTE — Progress Notes (Signed)
BP 116/64   Pulse 74   Temp 98.6 F (37 C)   Wt 131 lb (59.4 kg)   SpO2 98%   BMI 23.96 kg/m    Subjective:    Patient ID: Brianna Leon, female    DOB: [birth year], 64 y.o.   MRN: [id number]  HPI: Brianna Leon is a 64 y.o. female  Chief Complaint  Patient presents with  . grief   DEPRESSION- cut herself back down to the 10mg  but alternating back and forth with her 20mg . Has been needing to take the lorazepam very occasionally Mood status: better Satisfied with current treatment?: yes Symptom severity: mild  Duration of current treatment : chronic Side effects: no Medication compliance: excellent compliance Psychotherapy/counseling: no  Previous psychiatric medications: lexapro Depressed mood: no Anxious mood: yes Anhedonia: no Significant weight loss or gain: no Insomnia: no  Fatigue: yes Feelings of worthlessness or guilt: yes Impaired concentration/indecisiveness: no Suicidal ideations: no Hopelessness: no Crying spells: no Depression screen [facility] 2/9 12/05/2019 09/12/2019 06/25/2019 03/07/2019 01/03/2019  Decreased Interest 0 2 0 0 0  Down, Depressed, Hopeless 0 0 1 0 0  PHQ - 2 Score 0 2 1 0 0  Altered sleeping 0 1 3 0 0  Tired, decreased energy 0 1 3 0 0  Change in appetite 0 0 2 0 0  Feeling bad or failure about yourself  0 0 0 0 0  Trouble concentrating 0 0 0 0 0  Moving slowly or fidgety/restless 0 0 3 0 0  Suicidal thoughts 0 0 0 0 0  PHQ-9 Score 0 4 12 0 0  Difficult doing work/chores Not difficult at all Somewhat difficult Very difficult - Not difficult at all   GAD 7 : Generalized Anxiety Score 12/05/2019 09/12/2019 06/25/2019 03/07/2019  Nervous, Anxious, on Edge 0 2 3 0  Control/stop worrying 1 0 3 0  Worry too much - different things 1 0 3 0  Trouble relaxing 0 1 3 0  Restless 0 0 3 0  Easily annoyed or irritable 1 2 3  0  Afraid - awful might happen 0 - 3 0  Total GAD 7 Score 3 - 21 0  Anxiety Difficulty Not difficult at all Somewhat difficult  Somewhat difficult -   DIABETES Hypoglycemic episodes:yes- when she doesn't eat Polydipsia/polyuria: no Visual disturbance: no Chest pain: no Paresthesias: no Glucose Monitoring: yes  Accucheck frequency: Daily  Fasting glucose: 110-130 Taking Insulin?: no Blood Pressure Monitoring: not checking Retinal Examination: Not up to Date Foot Exam: Up to Date Diabetic Education: Completed Pneumovax: Up to Date Influenza: Up to Date Aspirin: yes  Relevant past medical, surgical, family and social history reviewed and updated as indicated. Interim medical history since our last visit reviewed. Allergies and medications reviewed and updated.  Review of Systems  Constitutional: Negative.   Respiratory: Negative.   Cardiovascular: Negative.   Gastrointestinal: Positive for constipation. Negative for abdominal distention, abdominal pain, anal bleeding, blood in stool, diarrhea, nausea, rectal pain and vomiting.  Genitourinary: Negative.   Musculoskeletal: Negative.   Skin: Negative.   Neurological: Negative.   Psychiatric/Behavioral: Negative for agitation, behavioral problems, confusion, decreased concentration, dysphoric mood, hallucinations, self-injury, sleep disturbance and suicidal ideas. The patient is nervous/anxious. The patient is not hyperactive.     Per HPI unless specifically indicated above     Objective:    BP 116/64   Pulse 74   Temp 98.6 F (37 C)   Wt 131 lb (59.4 kg)  SpO2 98%   BMI 23.96 kg/m   Wt Readings from Last 3 Encounters:  12/05/19 131 lb (59.4 kg)  09/12/19 128 lb (58.1 kg)  08/08/19 130 lb (59 kg)    Physical Exam Vitals and nursing note reviewed.  Constitutional:      General: She is not in acute distress.    Appearance: Normal appearance. She is not ill-appearing, toxic-appearing or diaphoretic.  HENT:     Head: Normocephalic and atraumatic.     Right Ear: External ear normal.     Left Ear: External ear normal.     Nose: Nose normal.       Mouth/Throat:     Mouth: Mucous membranes are moist.     Pharynx: Oropharynx is clear.  Eyes:     General: No scleral icterus.       Right eye: No discharge.        Left eye: No discharge.     Conjunctiva/sclera: Conjunctivae normal.     Pupils: Pupils are equal, round, and reactive to light.  Pulmonary:     Effort: Pulmonary effort is normal. No respiratory distress.     Comments: Speaking in full sentences Musculoskeletal:        General: Normal range of motion.     Cervical back: Normal range of motion.  Skin:    Coloration: Skin is not jaundiced or pale.     Findings: No bruising, erythema, lesion or rash.  Neurological:     Mental Status: She is alert and oriented to person, place, and time. Mental status is at baseline.  Psychiatric:        Mood and Affect: Mood normal.        Behavior: Behavior normal.        Thought Content: Thought content normal.        Judgment: Judgment normal.     Results for orders placed or performed in visit on 08/08/19  Bayer DCA Hb A1c Waived  Result Value Ref Range   HB A1C (BAYER DCA - WAIVED) 5.4 <7.0 %  CBC with Differential/Platelet  Result Value Ref Range   WBC 8.0 3.4 - 10.8 x10E3/uL   RBC 4.44 3.77 - 5.28 x10E6/uL   Hemoglobin 14.3 11.1 - 15.9 g/dL   Hematocrit 41.9 34.0 - 46.6 %   MCV 94 79 - 97 fL   MCH 32.2 26.6 - 33.0 pg   MCHC 34.1 31.5 - 35.7 g/dL   RDW 11.7 11.7 - 15.4 %   Platelets 230 150 - 450 x10E3/uL   Neutrophils 46 Not Estab. %   Lymphs 42 Not Estab. %   Monocytes 8 Not Estab. %   Eos 3 Not Estab. %   Basos 1 Not Estab. %   Neutrophils Absolute 3.7 1.4 - 7.0 x10E3/uL   Lymphocytes Absolute 3.4 (H) 0.7 - 3.1 x10E3/uL   Monocytes Absolute 0.7 0.1 - 0.9 x10E3/uL   EOS (ABSOLUTE) 0.2 0.0 - 0.4 x10E3/uL   Basophils Absolute 0.1 0.0 - 0.2 x10E3/uL   Immature Granulocytes 0 Not Estab. %   Immature Grans (Abs) 0.0 0.0 - 0.1 x10E3/uL  Comprehensive metabolic panel  Result Value Ref Range   Glucose 76 65 - 99  mg/dL   BUN 13 8 - 27 mg/dL   Creatinine, Ser 0.59 0.57 - 1.00 mg/dL   GFR calc non Af Amer 99 >59 mL/min/1.73   GFR calc Af Amer 114 >59 mL/min/1.73   BUN/Creatinine Ratio 22 12 - 28   Sodium 142  134 - 144 mmol/L   Potassium 4.8 3.5 - 5.2 mmol/L   Chloride 101 96 - 106 mmol/L   CO2 24 20 - 29 mmol/L   Calcium 9.6 8.7 - 10.3 mg/dL   Total Protein 7.4 6.0 - 8.5 g/dL   Albumin 4.9 (H) 3.8 - 4.8 g/dL   Globulin, Total 2.5 1.5 - 4.5 g/dL   Albumin/Globulin Ratio 2.0 1.2 - 2.2   Bilirubin Total 0.4 0.0 - 1.2 mg/dL   Alkaline Phosphatase 57 39 - 117 IU/L   AST 23 0 - 40 IU/L   ALT 20 0 - 32 IU/L  Lipid Panel w/o Chol/HDL Ratio  Result Value Ref Range   Cholesterol, Total 157 100 - 199 mg/dL   Triglycerides 91 0 - 149 mg/dL   HDL 45 >16 mg/dL   VLDL Cholesterol Cal 17 5 - 40 mg/dL   LDL Chol Calc (NIH) 95 0 - 99 mg/dL  Microalbumin, Urine Waived  Result Value Ref Range   Microalb, Ur Waived 10 0 - 19 mg/L   Creatinine, Urine Waived 100 10 - 300 mg/dL   Microalb/Creat Ratio <30 <30 mg/g  TSH  Result Value Ref Range   TSH 1.490 0.450 - 4.500 uIU/mL  UA/M w/rflx Culture, Routine   Specimen: Blood   BLD  Result Value Ref Range   Specific Gravity, UA 1.025 1.005 - 1.030   pH, UA 5.0 5.0 - 7.5   Color, UA Yellow Yellow   Appearance Ur Clear Clear   Leukocytes,UA Negative Negative   Protein,UA Negative Negative/Trace   Glucose, UA 2+ (A) Negative   Ketones, UA Negative Negative   RBC, UA Negative Negative   Bilirubin, UA Negative Negative   Urobilinogen, Ur 0.2 0.2 - 1.0 mg/dL   Nitrite, UA Negative Negative      Assessment & Plan:   Problem List Items Addressed This Visit      Endocrine   DM (diabetes mellitus), type 2 (HCC) - Primary    Has been running OK. Will check A1c and await results. Call with any concerns. Adjust dose as needed.       Relevant Orders   Bayer DCA Hb A1c Waived     Other   Anxiety    Under good control on current regimen- advised to take  the 20mg  of the lexapro for a month. Continue current regimen. Continue to monitor. Call with any concerns. Refills given. Will call when she needs a refill on her lorazepam.           Follow up plan: Return in about 3 months (around 03/04/2020).    . This visit was completed via Doximity due to the restrictions of the COVID-19 pandemic. All issues as above were discussed and addressed. Physical exam was done as above through visual confirmation on Doximity. If it was felt that the patient should be evaluated in the office, they were directed there. The patient verbally consented to this visit. . Location of the patient: home . Location of the provider: work . Those involved with this call:  . Provider: 03/06/2020, DO . CMA: Tiffany Reel, CMA . Front Desk/Registration: Olevia Perches  . Time spent on call: 25 minutes with patient face to face via video conference. More than 50% of this time was spent in counseling and coordination of care. 40 minutes total spent in review of patient's record and preparation of their chart.

## 2019-12-05 NOTE — Assessment & Plan Note (Signed)
Has been running OK. Will check A1c and await results. Call with any concerns. Adjust dose as needed.

## 2019-12-08 ENCOUNTER — Encounter: Payer: Self-pay | Admitting: Family Medicine

## 2019-12-08 NOTE — Progress Notes (Signed)
Lvm to make appt for 3-6 months.sent letter.

## 2019-12-12 ENCOUNTER — Other Ambulatory Visit: Payer: Self-pay

## 2019-12-13 ENCOUNTER — Other Ambulatory Visit: Payer: Self-pay

## 2019-12-13 ENCOUNTER — Emergency Department: Payer: BC Managed Care – PPO

## 2019-12-13 ENCOUNTER — Emergency Department
Admission: EM | Admit: 2019-12-13 | Discharge: 2019-12-13 | Disposition: A | Discharge status: Home / Self Care | Payer: BC Managed Care – PPO | Attending: Student | Admitting: Student

## 2019-12-13 ENCOUNTER — Encounter: Payer: Self-pay | Admitting: Emergency Medicine

## 2019-12-13 DIAGNOSIS — R252 Cramp and spasm: Secondary | ICD-10-CM | POA: Insufficient documentation

## 2019-12-13 DIAGNOSIS — Z79899 Other long term (current) drug therapy: Secondary | ICD-10-CM | POA: Diagnosis not present

## 2019-12-13 DIAGNOSIS — M79605 Pain in left leg: Secondary | ICD-10-CM | POA: Diagnosis not present

## 2019-12-13 DIAGNOSIS — Z7982 Long term (current) use of aspirin: Secondary | ICD-10-CM | POA: Diagnosis not present

## 2019-12-13 DIAGNOSIS — M79662 Pain in left lower leg: Secondary | ICD-10-CM | POA: Diagnosis not present

## 2019-12-13 DIAGNOSIS — R208 Other disturbances of skin sensation: Secondary | ICD-10-CM | POA: Diagnosis not present

## 2019-12-13 DIAGNOSIS — I2510 Atherosclerotic heart disease of native coronary artery without angina pectoris: Secondary | ICD-10-CM | POA: Diagnosis not present

## 2019-12-13 DIAGNOSIS — E1122 Type 2 diabetes mellitus with diabetic chronic kidney disease: Secondary | ICD-10-CM | POA: Insufficient documentation

## 2019-12-13 DIAGNOSIS — N189 Chronic kidney disease, unspecified: Secondary | ICD-10-CM | POA: Diagnosis not present

## 2019-12-13 DIAGNOSIS — Z7984 Long term (current) use of oral hypoglycemic drugs: Secondary | ICD-10-CM | POA: Diagnosis not present

## 2019-12-13 DIAGNOSIS — M7122 Synovial cyst of popliteal space [Baker], left knee: Secondary | ICD-10-CM | POA: Diagnosis not present

## 2019-12-13 DIAGNOSIS — E119 Type 2 diabetes mellitus without complications: Secondary | ICD-10-CM | POA: Insufficient documentation

## 2019-12-13 DIAGNOSIS — I129 Hypertensive chronic kidney disease with stage 1 through stage 4 chronic kidney disease, or unspecified chronic kidney disease: Secondary | ICD-10-CM | POA: Diagnosis not present

## 2019-12-13 LAB — URINALYSIS, COMPLETE (UACMP) WITH MICROSCOPIC
Bacteria, UA: NONE SEEN
Bilirubin Urine: NEGATIVE
Glucose, UA: >=500 mg/dL — AB
Hgb urine dipstick: NEGATIVE
Ketones, ur: NEGATIVE mg/dL
Leukocytes,Ua: NEGATIVE
Nitrite: NEGATIVE
Protein, ur: NEGATIVE mg/dL
Specific Gravity, Urine: 1.031 — ABNORMAL HIGH (ref 1.005–1.030)
Squamous Epithelial / HPF: NONE SEEN (ref 0–5)
pH: 5 (ref 5.0–8.0)

## 2019-12-13 LAB — BASIC METABOLIC PANEL
Anion gap: 9 (ref 5–15)
BUN: 13 mg/dL (ref 8–23)
CO2: 29 mmol/L (ref 22–32)
Calcium: 9.2 mg/dL (ref 8.9–10.3)
Chloride: 101 mmol/L (ref 98–111)
Creatinine, Ser: 0.4 mg/dL — ABNORMAL LOW (ref 0.44–1.00)
GFR calc Af Amer: >60 mL/min (ref >60)
GFR calc non Af Amer: >60 mL/min (ref >60)
Glucose, Bld: 122 mg/dL — ABNORMAL HIGH (ref 70–99)
Potassium: 3.9 mmol/L (ref 3.5–5.1)
Sodium: 139 mmol/L (ref 135–145)

## 2019-12-13 LAB — CBC WITH DIFFERENTIAL/PLATELET
Abs Immature Granulocytes: 0.04 10*3/uL (ref 0.00–0.07)
Basophils Absolute: 0.1 10*3/uL (ref 0.0–0.1)
Basophils Relative: 1 %
Eosinophils Absolute: 0.1 10*3/uL (ref 0.0–0.5)
Eosinophils Relative: 1 %
HCT: 41.5 % (ref 36.0–46.0)
Hemoglobin: 14 g/dL (ref 12.0–15.0)
Immature Granulocytes: 0 %
Lymphocytes Relative: 27 %
Lymphs Abs: 2.9 10*3/uL (ref 0.7–4.0)
MCH: 32.3 pg (ref 26.0–34.0)
MCHC: 33.7 g/dL (ref 30.0–36.0)
MCV: 95.6 fL (ref 80.0–100.0)
Monocytes Absolute: 0.7 10*3/uL (ref 0.1–1.0)
Monocytes Relative: 7 %
Neutro Abs: 6.8 10*3/uL (ref 1.7–7.7)
Neutrophils Relative %: 64 %
Platelets: 212 10*3/uL (ref 150–400)
RBC: 4.34 MIL/uL (ref 3.87–5.11)
RDW: 11.6 % (ref 11.5–15.5)
WBC: 10.6 10*3/uL — ABNORMAL HIGH (ref 4.0–10.5)
nRBC: 0 % (ref 0.0–0.2)

## 2019-12-13 MED ORDER — CYCLOBENZAPRINE HCL 5 MG PO TABS: 5.0000 mg | ORAL_TABLET | Freq: Three times a day (TID) | ORAL | 0 refills | Status: DC | PRN

## 2019-12-13 NOTE — ED Notes (Signed)
See triage note  Presents with some pain to left foot  States she noticed a slightly swollen area to to of foot  And is having burning type pain ambulates well to room   Good pulses

## 2019-12-13 NOTE — Discharge Instructions (Addendum)
Your exam, ultrasound, and labs are normal at this time. You do not have a DVT or blood clot in the leg. Your electrolytes are also in normal range.  Consider a tablespoon of mustard or apple cider vinegar to reduce symptoms. See Dr. Laural Benes for ongoing symptoms.

## 2019-12-13 NOTE — ED Triage Notes (Signed)
Pt to ED with c/o of left foot pain that she states "stings and burns" and radiates up calf.

## 2019-12-13 NOTE — ED Provider Notes (Signed)
[facility] Emergency Department Provider Note ____________________________________________  Time seen: 1515  I have reviewed the triage vital signs and the nursing notes.  HISTORY  Chief Complaint  Foot Pain  HPI Brianna Leon is a 64 y.o. female presents her self to the ED for evaluation of left foot pain.   Patient describes that her left foot "stings and burns."  She reports the cramping in her toes and calf.  She denies any recent trauma or injury to the foot. She also denies any skin temp/color changes. She reports symptoms are improved with resting with her legs elevated. She has a history of diabetes, arthritis, and reflux.  Past Medical History:  Diagnosis Date  . Arthritis    KNEES  . Biliary colic 7/62/8315  . CAD (coronary artery disease)   . DM (diabetes mellitus), type 2 ([place]) 2000   insulin started 2016  . Elevated liver enzymes   . Family history of colonic polyps    father  . GERD (gastroesophageal reflux disease)   . Hemorrhage    DURING PREGNANCY 31 YEARS AGO  . Hyperlipidemia   . IBS (irritable bowel syndrome)   . Migraine   . RUQ pain 06/12/2017    Patient Active Problem List   Diagnosis Date Noted  . Malnutrition of moderate degree ([place]) 01/03/2019  . Anxiety 05/17/2018  . Psychophysiological insomnia 05/17/2018  . CAD (coronary artery disease)   . Hyperlipidemia   . Benign hypertensive renal disease   . GERD (gastroesophageal reflux disease)   . DM (diabetes mellitus), type 2 ([place])   . Elevated liver enzymes 03/26/2015  . Family history of polyps in the colon 03/26/2015  . Hx of nausea 03/26/2015  . Migraines 06/06/2014    Past Surgical History:  Procedure Laterality Date  . ABDOMINAL HYSTERECTOMY  22 years ago  . CARDIAC CATHETERIZATION     x 2  . CARPAL TUNNEL RELEASE Right   . CHOLECYSTECTOMY N/A 09/20/2017   Procedure: LAPAROSCOPIC CHOLECYSTECTOMY WITH INTRAOPERATIVE CHOLANGIOGRAM;  Surgeon: Robert Bellow,  MD;  Location: [facility] ORS;  Service: General;  Laterality: N/A;  . COLONOSCOPY  05/18/2015   repeat 5 years  . ESOPHAGOGASTRODUODENOSCOPY  05/18/2015  . KNEE ARTHROSCOPY  2001 and 2009  . TONSILLECTOMY      Prior to Admission medications   Medication Sig Start Date End Date Taking? Authorizing Provider  Ascorbic Acid (CHEWABLE VITAMIN C PO) Take by mouth daily.    [provider]  aspirin EC 81 MG tablet Take 81 mg by mouth daily.    [provider]  aspirin-acetaminophen-caffeine (EXCEDRIN MIGRAINE) [phone] MG tablet Take 2 tablets by mouth every 6 (six) hours as needed for headache.    [provider]  blood glucose meter kit and supplies KIT Dispense based on patient and insurance preference. Use up to four times daily as directed. (FOR ICD-9 250.00, 250.01). 10/24/16   Harvest Dark, MD  cyclobenzaprine (FLEXERIL) 5 MG tablet Take 1 tablet (5 mg total) by mouth 3 (three) times daily as needed. 12/13/19   Quest Tavenner, Dannielle Karvonen, PA-C  Dulaglutide (TRULICITY) 7.10 GY/6.9SW SOPN Inject 0.75 mg into the skin once a week. 08/10/19   Johnson, Megan P, DO  EPINEPHrine 0.3 mg/0.3 mL IJ SOAJ injection INJECT INTRAMUSCULARLY AS DIRECTED 07/30/18   [provider]  escitalopram (LEXAPRO) 20 MG tablet Take 1 tablet (20 mg total) by mouth daily. 09/12/19   Johnson, Megan P, DO  fexofenadine (ALLEGRA) 180 MG tablet Take  180 mg by mouth daily.    [provider]  JARDIANCE 25 MG TABS tablet TAKE 1 TABLET BY MOUTH DAILY 10/14/19   Johnson, Megan P, DO  LORazepam (ATIVAN) 0.5 MG tablet Take 1 tablet (0.5 mg total) by mouth 2 (two) times daily as needed for anxiety. 09/12/19   Johnson, Megan P, DO  meclizine (ANTIVERT) 12.5 MG tablet Take 1 tablet (12.5 mg total) by mouth 3 (three) times daily as needed for dizziness. 08/10/19   Cannady, Henrine Screws T, NP  Multiple Vitamins-Minerals (OCUVITE ADULT 50+ PO) Take 1 capsule by mouth daily.    [provider]   Multiple Vitamins-Minerals (ZINC PO) Take by mouth daily.    [provider]  OVER THE COUNTER MEDICATION Take 2 capsules by mouth daily. Hearing Health Supplement    [provider]  simvastatin (ZOCOR) 40 MG tablet TAKE 1 TABLET(40 MG) BY MOUTH AT BEDTIME 11/04/19   Johnson, Megan P, DO  UNABLE TO FIND Take by mouth daily. Vitamin D3, vitamin B, Bioflex    [provider]  UNABLE TO FIND Take by mouth daily. Ocutive eye vitamins, cinnamon vitamins    [provider]    Allergies Carafate [sucralfate], Atorvastatin, Metformin and related, Other, Penicillins, and Tramadol  Family History  Problem Relation Age of Onset  . Colon polyps Father   . Diabetes Father   . Hypertension Father   . Heart disease Father   . Stroke Father   . Diabetes Mother   . Hypertension Mother   . Diabetes Brother   . Heart failure Brother        pacemaker  . Hypertension Brother   . Heart attack Brother   . Migraines Daughter   . Lupus Son   . Diabetes Maternal Grandmother   . Heart disease Maternal Grandmother   . Stroke Maternal Grandfather   . Hypertension Paternal Grandmother   . Stroke Paternal Grandfather   . Hyperlipidemia Brother   . Breast cancer Neg Hx     Social History Social History   Tobacco Use  . Smoking status: Never Smoker  . Smokeless tobacco: Never Used  Substance Use Topics  . Alcohol use: No  . Drug use: No    Review of Systems  Constitutional: Negative for fever. Cardiovascular: Negative for chest pain. Respiratory: Negative for shortness of breath. Musculoskeletal: Negative for back pain. Left calf pain  Skin: Negative for rash. Neurological: Negative for headaches, focal weakness or numbness. ____________________________________________  PHYSICAL EXAM:  VITAL SIGNS: ED Triage Vitals  Enc Vitals Group     BP 12/13/19 1439 138/73     Pulse Rate 12/13/19 1439 79     Resp --      Temp 12/13/19 1439 98.4 F (36.9 C)      Temp src --      SpO2 12/13/19 1439 98 %     Weight 12/13/19 1440 130 lb (59 kg)     Height 12/13/19 1440 '5\' 2"'  (1.575 m)     Head Circumference --      Peak Flow --      Pain Score 12/13/19 1440 7     Pain Loc --      Pain Edu? --      Excl. in [place]? --     Constitutional: Alert and oriented. Well appearing and in no distress. Head: Normocephalic and atraumatic. Eyes: Conjunctivae are normal. Normal extraocular movements Cardiovascular: Normal rate, regular rhythm. Normal distal pulses. No CCE distally.  Respiratory:  Normal respiratory effort. No wheezes/rales/rhonchi. Musculoskeletal: Nontender with normal range of motion in all extremities.  Neurologic:  Normal gait without ataxia. Normal speech and language. No gross focal neurologic deficits are appreciated. Skin:  Skin is warm, dry and intact. No rash noted. No bruise, ecchymosis, erythema, or edema noted.  Psychiatric: Mood and affect are normal. Patient exhibits appropriate insight and judgment. ____________________________________________   LABS (pertinent positives/negatives) Labs Reviewed  URINALYSIS, COMPLETE (UACMP) WITH MICROSCOPIC - Abnormal; Notable for the following components:      Result Value   Color, Urine YELLOW (*)    APPearance CLEAR (*)    Specific Gravity, Urine 1.031 (*)    Glucose, UA >=500 (*)    All other components within normal limits  CBC WITH DIFFERENTIAL/PLATELET - Abnormal; Notable for the following components:   WBC 10.6 (*)    All other components within normal limits  BASIC METABOLIC PANEL - Abnormal; Notable for the following components:   Glucose, Bld 122 (*)    Creatinine, Ser 0.40 (*)    All other components within normal limits  ____________________________________________   RADIOLOGY  US/ Venous LLE  IMPRESSION: 1. No evidence of DVT in the left lower extremity. 2. Small popliteal fossa cyst without visible joint space  extension. ____________________________________________  PROCEDURES  Procedures ____________________________________________  INITIAL IMPRESSION / ASSESSMENT AND PLAN / ED COURSE  DDX: DVT, phlebitis, muscle strain, radiculopathy, neuropathy, abscess  Patient with ED evaluation of complaints of intermittent cramping to the left lower leg at the calf.  She is also describing some pain to the dorsal lateral foot and ankle.  Patient's clinical picture is concerning for probable underlying vascular etiology.  There is also concern for possible electrolyte imbalance.  Her labs are reassuring and her did not reveal any underlying DVT.  Patient is reassured by her exam and imaging studies.  She will follow with primary provider for ongoing symptom management.  Return precautions have been reviewed.  FARIDA MCREYNOLDS was evaluated in Emergency Department on 12/13/2019 for the symptoms described in the history of present illness. She was evaluated in the context of the global COVID-19 pandemic, which necessitated consideration that the patient might be at risk for infection with the SARS-CoV-2 virus that causes COVID-19. Institutional protocols and algorithms that pertain to the evaluation of patients at risk for COVID-19 are in a state of rapid change based on information released by regulatory bodies including the CDC and federal and state organizations. These policies and algorithms were followed during the patient's care in the ED. ____________________________________________  FINAL CLINICAL IMPRESSION(S) / ED DIAGNOSES  Final diagnoses:  Calf cramp      Chayanne Speir, Dannielle Karvonen, PA-C 12/13/19 2349    Lilia Pro., MD 12/14/19 0003

## 2019-12-19 ENCOUNTER — Other Ambulatory Visit: Payer: BC Managed Care – PPO

## 2019-12-19 ENCOUNTER — Other Ambulatory Visit: Payer: Self-pay

## 2019-12-19 DIAGNOSIS — E1129 Type 2 diabetes mellitus with other diabetic kidney complication: Secondary | ICD-10-CM

## 2019-12-19 LAB — BAYER DCA HB A1C WAIVED: HB A1C (BAYER DCA - WAIVED): 6.3 % (ref <7.0)

## 2019-12-30 ENCOUNTER — Other Ambulatory Visit: Payer: Self-pay | Admitting: Obstetrics & Gynecology

## 2019-12-30 DIAGNOSIS — H353131 Nonexudative age-related macular degeneration, bilateral, early dry stage: Secondary | ICD-10-CM | POA: Diagnosis not present

## 2019-12-30 DIAGNOSIS — Z1211 Encounter for screening for malignant neoplasm of colon: Secondary | ICD-10-CM | POA: Diagnosis not present

## 2019-12-30 DIAGNOSIS — Z0000 Encounter for general adult medical examination without abnormal findings: Secondary | ICD-10-CM | POA: Diagnosis not present

## 2019-12-30 DIAGNOSIS — H35369 Drusen (degenerative) of macula, unspecified eye: Secondary | ICD-10-CM | POA: Diagnosis not present

## 2019-12-30 DIAGNOSIS — E119 Type 2 diabetes mellitus without complications: Secondary | ICD-10-CM | POA: Diagnosis not present

## 2019-12-30 DIAGNOSIS — Z1231 Encounter for screening mammogram for malignant neoplasm of breast: Secondary | ICD-10-CM | POA: Diagnosis not present

## 2019-12-30 DIAGNOSIS — Z01419 Encounter for gynecological examination (general) (routine) without abnormal findings: Secondary | ICD-10-CM | POA: Diagnosis not present

## 2020-01-14 ENCOUNTER — Ambulatory Visit
Admission: RE | Admit: 2020-01-14 | Discharge: 2020-01-14 | Disposition: A | Discharge status: Home / Self Care | Payer: BC Managed Care – PPO | Source: Ambulatory Visit | Attending: Obstetrics & Gynecology | Admitting: Obstetrics & Gynecology

## 2020-01-14 DIAGNOSIS — Z1231 Encounter for screening mammogram for malignant neoplasm of breast: Secondary | ICD-10-CM | POA: Diagnosis not present

## 2020-03-17 ENCOUNTER — Ambulatory Visit: Payer: BC Managed Care – PPO | Admitting: Family Medicine

## 2020-03-17 DIAGNOSIS — R300 Dysuria: Secondary | ICD-10-CM | POA: Diagnosis not present

## 2020-03-19 ENCOUNTER — Ambulatory Visit: Payer: BC Managed Care – PPO | Admitting: Family Medicine

## 2020-04-09 ENCOUNTER — Ambulatory Visit: Payer: BC Managed Care – PPO | Admitting: Family Medicine

## 2020-04-14 ENCOUNTER — Telehealth (INDEPENDENT_AMBULATORY_CARE_PROVIDER_SITE_OTHER): Payer: BC Managed Care – PPO | Admitting: Family Medicine

## 2020-04-14 ENCOUNTER — Encounter: Payer: Self-pay | Admitting: Family Medicine

## 2020-04-14 VITALS — BP 118/60 | Temp 98.4°F

## 2020-04-14 DIAGNOSIS — I129 Hypertensive chronic kidney disease with stage 1 through stage 4 chronic kidney disease, or unspecified chronic kidney disease: Secondary | ICD-10-CM | POA: Diagnosis not present

## 2020-04-14 DIAGNOSIS — E782 Mixed hyperlipidemia: Secondary | ICD-10-CM

## 2020-04-14 DIAGNOSIS — F419 Anxiety disorder, unspecified: Secondary | ICD-10-CM | POA: Diagnosis not present

## 2020-04-14 DIAGNOSIS — R809 Proteinuria, unspecified: Secondary | ICD-10-CM

## 2020-04-14 DIAGNOSIS — E1129 Type 2 diabetes mellitus with other diabetic kidney complication: Secondary | ICD-10-CM | POA: Diagnosis not present

## 2020-04-14 NOTE — Progress Notes (Signed)
BP 118/60   Temp 98.4 F (36.9 C)    Subjective:    Patient ID: Brianna Leon, female    DOB: 1955-12-19, 64 y.o.   MRN: [id number]  HPI: DAISJA Leon is a 65 y.o. female  Chief Complaint  Patient presents with  . Diabetes  . Anxiety   DIABETES Hypoglycemic episodes:yes- down a little bit Polydipsia/polyuria: no Visual disturbance: no Chest pain: no Paresthesias: no Glucose Monitoring: yes  Accucheck frequency: occasionally Taking Insulin?: no Blood Pressure Monitoring: not checking Retinal Examination: Not up to Date Foot Exam: Up to Date Diabetic Education: Completed Pneumovax: Up to Date Influenza: Up to Date Aspirin: yes  HYPERTENSION / HYPERLIPIDEMIA Satisfied with current treatment? yes Duration of hypertension: chronic BP monitoring frequency: not checking BP medication side effects: no Duration of hyperlipidemia: chronic Cholesterol medication side effects: no Cholesterol supplements: none Past cholesterol medications: simvastatin Medication compliance: excellent compliance Aspirin: yes Recent stressors: no Recurrent headaches: no Visual changes: no Palpitations: no Dyspnea: no Chest pain: no Lower extremity edema: no Dizzy/lightheaded: no  ANXIETY/STRESS- has been under a lot of stress. Her mom was in the hospital earlier in the year and she has had to be caring for her.  Duration:uncontrolled Anxious mood: yes  Excessive worrying: yes Irritability: yes  Sweating: no Nausea: no Palpitations:no Hyperventilation: no Panic attacks: no Agoraphobia: no  Obscessions/compulsions: no Depressed mood: yes Depression screen [facility] 2/9 04/14/2020 12/05/2019 09/12/2019 06/25/2019 03/07/2019  Decreased Interest 0 0 2 0 0  Down, Depressed, Hopeless 2 0 0 1 0  PHQ - 2 Score 2 0 2 1 0  Altered sleeping 2 0 1 3 0  Tired, decreased energy 2 0 1 3 0  Change in appetite 1 0 0 2 0  Feeling bad or failure about yourself  0 0 0 0 0  Trouble concentrating 0 0 0  0 0  Moving slowly or fidgety/restless 0 0 0 3 0  Suicidal thoughts 0 0 0 0 0  PHQ-9 Score 7 0 4 12 0  Difficult doing work/chores - Not difficult at all Somewhat difficult Very difficult -  Some recent data might be hidden   GAD 7 : Generalized Anxiety Score 04/14/2020 12/05/2019 09/12/2019 06/25/2019  Nervous, Anxious, on Edge 0 0 2 3  Control/stop worrying 0 1 0 3  Worry too much - different things 0 1 0 3  Trouble relaxing 0 0 1 3  Restless 0 0 0 3  Easily annoyed or irritable 1 1 2 3   Afraid - awful might happen 0 0 - 3  Total GAD 7 Score 1 3 - 21  Anxiety Difficulty Somewhat difficult Not difficult at all Somewhat difficult Somewhat difficult   Anhedonia: no Weight changes: no Insomnia: no   Hypersomnia: no Fatigue/loss of energy: yes Feelings of worthlessness: no Feelings of guilt: yes Impaired concentration/indecisiveness: yes Suicidal ideations: no  Crying spells: yes Recent Stressors/Life Changes: yes   Relationship problems: yes   Family stress: yes     Financial stress: yes    Job stress: yes    Recent death/loss: no   Relevant past medical, surgical, family and social history reviewed and updated as indicated. Interim medical history since our last visit reviewed. Allergies and medications reviewed and updated.  Review of Systems  Constitutional: Negative.   HENT: Negative.   Respiratory: Negative.   Cardiovascular: Negative.   Gastrointestinal: Negative.   Musculoskeletal: Negative.   Skin: Negative.   Neurological: Negative.   Psychiatric/Behavioral: Positive  for dysphoric mood. Negative for agitation, behavioral problems, confusion, decreased concentration, hallucinations, self-injury, sleep disturbance and suicidal ideas. The patient is nervous/anxious. The patient is not hyperactive.     Per HPI unless specifically indicated above     Objective:    BP 118/60   Temp 98.4 F (36.9 C)   Wt Readings from Last 3 Encounters:  12/13/19 130 lb (59 kg)   12/05/19 131 lb (59.4 kg)  09/12/19 128 lb (58.1 kg)    Physical Exam Vitals and nursing note reviewed.  Constitutional:      General: She is not in acute distress.    Appearance: Normal appearance. She is not ill-appearing, toxic-appearing or diaphoretic.  HENT:     Head: Normocephalic and atraumatic.     Right Ear: External ear normal.     Left Ear: External ear normal.     Nose: Nose normal.     Mouth/Throat:     Mouth: Mucous membranes are moist.     Pharynx: Oropharynx is clear.  Eyes:     General: No scleral icterus.       Right eye: No discharge.        Left eye: No discharge.     Conjunctiva/sclera: Conjunctivae normal.     Pupils: Pupils are equal, round, and reactive to light.  Pulmonary:     Effort: Pulmonary effort is normal. No respiratory distress.     Comments: Speaking in full sentences Musculoskeletal:        General: Normal range of motion.     Cervical back: Normal range of motion.  Skin:    Coloration: Skin is not jaundiced or pale.     Findings: No bruising, erythema, lesion or rash.  Neurological:     Mental Status: She is alert and oriented to person, place, and time. Mental status is at baseline.  Psychiatric:        Mood and Affect: Mood normal.        Behavior: Behavior normal.        Thought Content: Thought content normal.        Judgment: Judgment normal.     Results for orders placed or performed in visit on 12/19/19  Bayer DCA Hb A1c Waived  Result Value Ref Range   HB A1C (BAYER DCA - WAIVED) 6.3 <7.0 %      Assessment & Plan:   Problem List Items Addressed This Visit      Endocrine   DM (diabetes mellitus), type 2 ([place])    Due for recheck on her sugars. Will come in shortly for labs. Await results. Treat as needed. Call with any concerns.       Relevant Medications   empagliflozin (JARDIANCE) 25 MG TABS tablet   simvastatin (ZOCOR) 40 MG tablet   Dulaglutide (TRULICITY) 6.23 JS/2.8BT SOPN   Other Relevant Orders   CBC with  Differential/Platelet   Comprehensive metabolic panel   Microalbumin, Urine Waived   Bayer DCA Hb A1c Waived   Urinalysis, Routine w reflex microscopic     Genitourinary   Benign hypertensive renal disease    Due for recheck on her labs. Will come in shortly for labs. Await results. Treat as needed. Call with any concerns.       Relevant Orders   CBC with Differential/Platelet   Comprehensive metabolic panel   Microalbumin, Urine Waived   TSH     Other   Hyperlipidemia - Primary    Due for recheck on her labs. Will  come in shortly for labs. Await results. Treat as needed. Call with any concerns.       Relevant Medications   simvastatin (ZOCOR) 40 MG tablet   Other Relevant Orders   CBC with Differential/Platelet   Comprehensive metabolic panel   Lipid Panel w/o Chol/HDL Ratio   Anxiety    Not doing great. Has been under a lot of stress at home. Will continue current regimen. Continue to monitor. Call with any concerns. Refills given today.      Relevant Medications   escitalopram (LEXAPRO) 20 MG tablet   LORazepam (ATIVAN) 0.5 MG tablet   Other Relevant Orders   CBC with Differential/Platelet   Comprehensive metabolic panel       Follow up plan: Return in about 3 months (around 07/15/2020).    . This visit was completed via MyChart due to the restrictions of the COVID-19 pandemic. All issues as above were discussed and addressed. Physical exam was done as above through visual confirmation on MyChart. If it was felt that the patient should be evaluated in the office, they were directed there. The patient verbally consented to this visit. . Location of the patient: home . Location of the provider: work . Those involved with this call:  . Provider: Olevia Perches, DO . CMA: Floydene Flock, RMA . Front Desk/Registration: Adela Ports  . Time spent on call: 25 minutes with patient face to face via video conference. More than 50% of this time was spent in counseling  and coordination of care. 40 minutes total spent in review of patient's record and preparation of their chart.

## 2020-04-15 ENCOUNTER — Encounter: Payer: Self-pay | Admitting: Family Medicine

## 2020-04-15 MED ORDER — SIMVASTATIN 40 MG PO TABS: ORAL_TABLET | ORAL | 1 refills | Status: DC

## 2020-04-15 MED ORDER — LORAZEPAM 0.5 MG PO TABS: 0.5000 mg | ORAL_TABLET | Freq: Two times a day (BID) | ORAL | 1 refills | Status: DC | PRN

## 2020-04-15 MED ORDER — EMPAGLIFLOZIN 25 MG PO TABS: 25.0000 mg | ORAL_TABLET | Freq: Every day | ORAL | 1 refills | Status: DC

## 2020-04-15 MED ORDER — ESCITALOPRAM OXALATE 20 MG PO TABS: 20.0000 mg | ORAL_TABLET | Freq: Every day | ORAL | 1 refills | Status: DC

## 2020-04-15 MED ORDER — TRULICITY 0.75 MG/0.5ML ~~LOC~~ SOAJ: 0.7500 mg | SUBCUTANEOUS | 3 refills | Status: DC

## 2020-04-15 NOTE — Assessment & Plan Note (Signed)
Not doing great. Has been under a lot of stress at home. Will continue current regimen. Continue to monitor. Call with any concerns. Refills given today.

## 2020-04-15 NOTE — Assessment & Plan Note (Signed)
Due for recheck on her sugars. Will come in shortly for labs. Await results. Treat as needed. Call with any concerns.

## 2020-04-15 NOTE — Assessment & Plan Note (Signed)
Due for recheck on her labs. Will come in shortly for labs. Await results. Treat as needed. Call with any concerns.  

## 2020-04-15 NOTE — Assessment & Plan Note (Signed)
Due for recheck on her labs. Will come in shortly for labs. Await results. Treat as needed. Call with any concerns.

## 2020-04-23 ENCOUNTER — Other Ambulatory Visit: Payer: Self-pay

## 2020-04-23 ENCOUNTER — Other Ambulatory Visit: Payer: BC Managed Care – PPO

## 2020-04-23 DIAGNOSIS — R809 Proteinuria, unspecified: Secondary | ICD-10-CM | POA: Diagnosis not present

## 2020-04-23 DIAGNOSIS — E1129 Type 2 diabetes mellitus with other diabetic kidney complication: Secondary | ICD-10-CM

## 2020-04-23 DIAGNOSIS — E782 Mixed hyperlipidemia: Secondary | ICD-10-CM

## 2020-04-23 DIAGNOSIS — I129 Hypertensive chronic kidney disease with stage 1 through stage 4 chronic kidney disease, or unspecified chronic kidney disease: Secondary | ICD-10-CM

## 2020-04-23 DIAGNOSIS — F419 Anxiety disorder, unspecified: Secondary | ICD-10-CM

## 2020-04-23 LAB — URINALYSIS, ROUTINE W REFLEX MICROSCOPIC
Bilirubin, UA: NEGATIVE
Ketones, UA: NEGATIVE
Leukocytes,UA: NEGATIVE
Nitrite, UA: NEGATIVE
Protein,UA: NEGATIVE
RBC, UA: NEGATIVE
Specific Gravity, UA: 1.02 (ref 1.005–1.030)
Urobilinogen, Ur: 0.2 mg/dL (ref 0.2–1.0)
pH, UA: 5 (ref 5.0–7.5)

## 2020-04-23 LAB — MICROALBUMIN, URINE WAIVED
Creatinine, Urine Waived: 200 mg/dL (ref 10–300)
Microalb, Ur Waived: 10 mg/L (ref 0–19)
Microalb/Creat Ratio: <30 mg/g (ref <30)

## 2020-04-23 LAB — BAYER DCA HB A1C WAIVED: HB A1C (BAYER DCA - WAIVED): 6.2 % (ref <7.0)

## 2020-04-24 LAB — LIPID PANEL W/O CHOL/HDL RATIO
Cholesterol, Total: 149 mg/dL (ref 100–199)
HDL: 50 mg/dL (ref >39)
LDL Chol Calc (NIH): 85 mg/dL (ref 0–99)
Triglycerides: 74 mg/dL (ref 0–149)
VLDL Cholesterol Cal: 14 mg/dL (ref 5–40)

## 2020-04-24 LAB — CBC WITH DIFFERENTIAL/PLATELET
Basophils Absolute: 0.1 10*3/uL (ref 0.0–0.2)
Basos: 1 %
EOS (ABSOLUTE): 0.2 10*3/uL (ref 0.0–0.4)
Eos: 3 %
Hematocrit: 41.8 % (ref 34.0–46.6)
Hemoglobin: 14 g/dL (ref 11.1–15.9)
Immature Grans (Abs): 0 10*3/uL (ref 0.0–0.1)
Immature Granulocytes: 0 %
Lymphocytes Absolute: 3 10*3/uL (ref 0.7–3.1)
Lymphs: 38 %
MCH: 32.4 pg (ref 26.6–33.0)
MCHC: 33.5 g/dL (ref 31.5–35.7)
MCV: 97 fL (ref 79–97)
Monocytes Absolute: 0.7 10*3/uL (ref 0.1–0.9)
Monocytes: 9 %
Neutrophils Absolute: 3.9 10*3/uL (ref 1.4–7.0)
Neutrophils: 49 %
Platelets: 214 10*3/uL (ref 150–450)
RBC: 4.32 x10E6/uL (ref 3.77–5.28)
RDW: 11 % — ABNORMAL LOW (ref 11.7–15.4)
WBC: 7.8 10*3/uL (ref 3.4–10.8)

## 2020-04-24 LAB — COMPREHENSIVE METABOLIC PANEL
ALT: 18 IU/L (ref 0–32)
AST: 19 IU/L (ref 0–40)
Albumin/Globulin Ratio: 1.7 (ref 1.2–2.2)
Albumin: 4.7 g/dL (ref 3.8–4.8)
Alkaline Phosphatase: 84 IU/L (ref 48–121)
BUN/Creatinine Ratio: 27 (ref 12–28)
BUN: 15 mg/dL (ref 8–27)
Bilirubin Total: 0.4 mg/dL (ref 0.0–1.2)
CO2: 25 mmol/L (ref 20–29)
Calcium: 9.6 mg/dL (ref 8.7–10.3)
Chloride: 103 mmol/L (ref 96–106)
Creatinine, Ser: 0.55 mg/dL — ABNORMAL LOW (ref 0.57–1.00)
GFR calc Af Amer: 116 mL/min/{1.73_m2} (ref >59)
GFR calc non Af Amer: 101 mL/min/{1.73_m2} (ref >59)
Globulin, Total: 2.7 g/dL (ref 1.5–4.5)
Glucose: 156 mg/dL — ABNORMAL HIGH (ref 65–99)
Potassium: 4.6 mmol/L (ref 3.5–5.2)
Sodium: 143 mmol/L (ref 134–144)
Total Protein: 7.4 g/dL (ref 6.0–8.5)

## 2020-04-24 LAB — TSH: TSH: 1.43 u[IU]/mL (ref 0.450–4.500)

## 2020-05-14 IMAGING — MG DIGITAL SCREENING BILAT W/ TOMO W/ CAD
8 series · 8 of 24 positions shown · non-contrast
Comparison: Previous exam(s).

CLINICAL DATA: Screening.

EXAM:
DIGITAL SCREENING BILATERAL MAMMOGRAM WITH TOMO AND CAD

[R CC synth-2D]
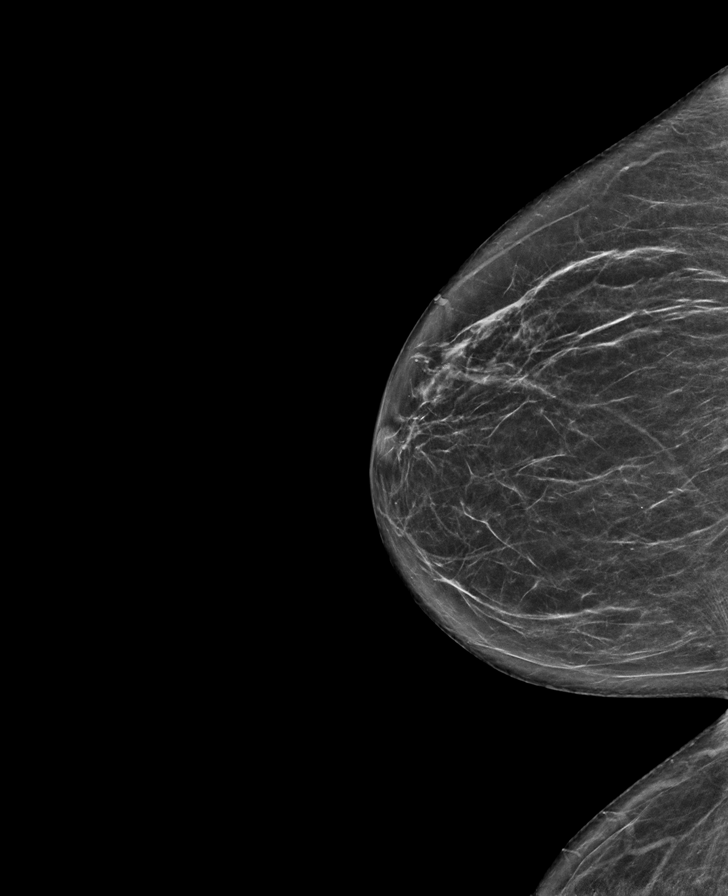

[L CC synth-2D]
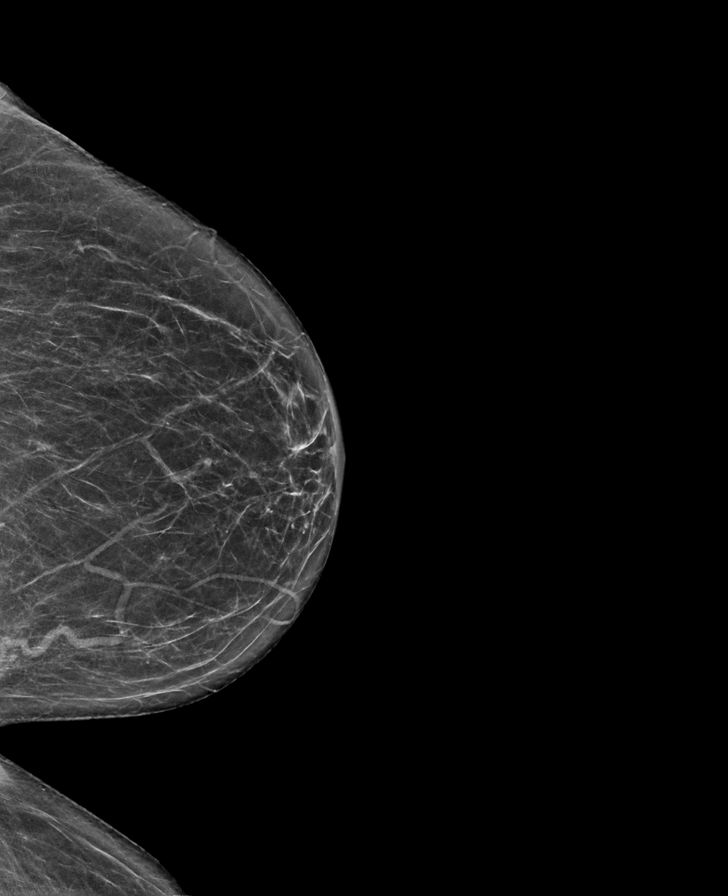

[R MLO synth-2D]
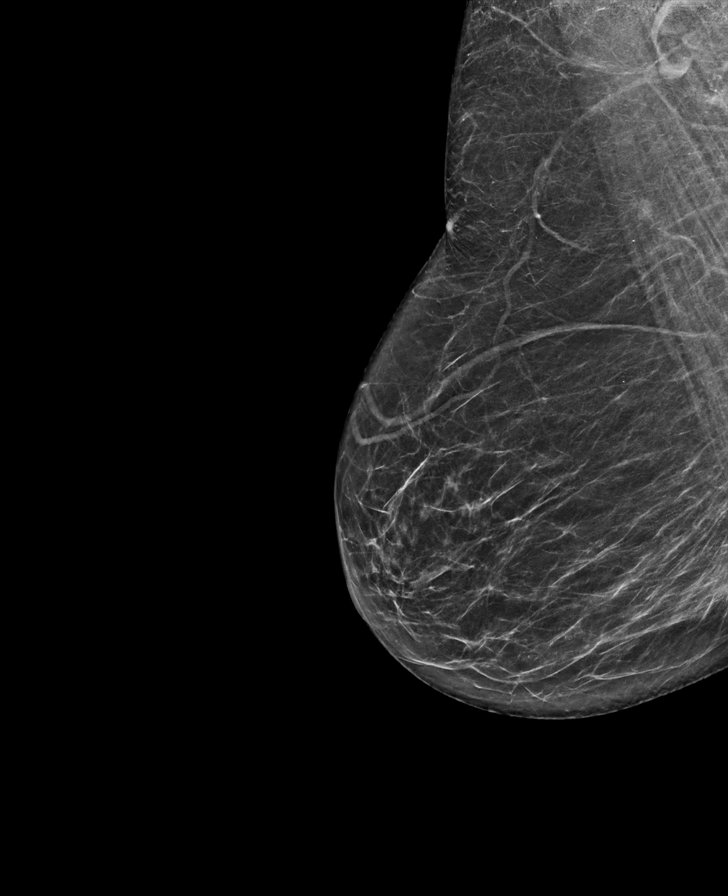

[L MLO synth-2D]
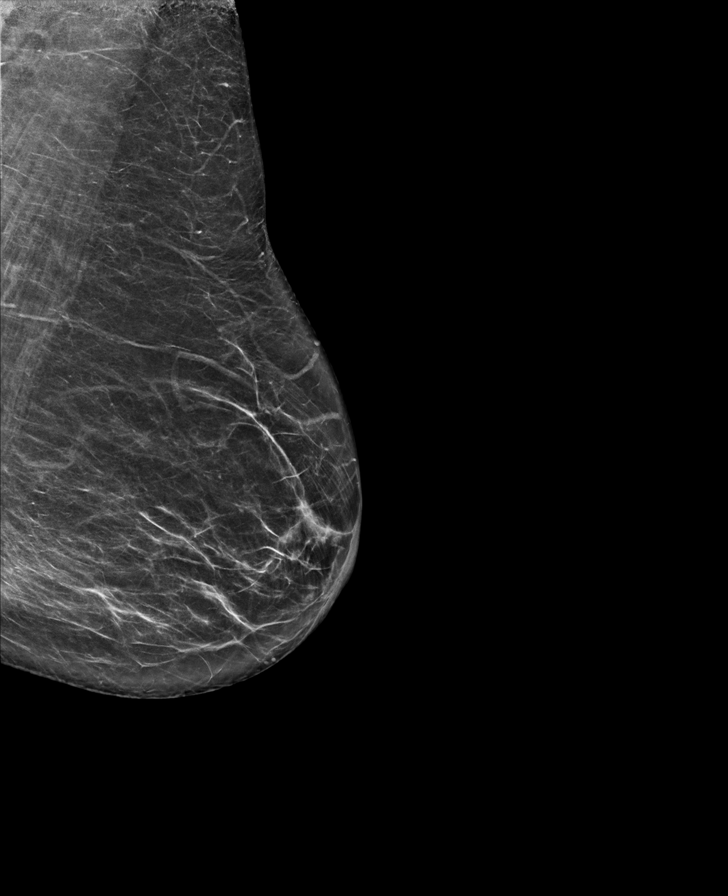

[L CC tomo · tomo slice 31/61.0]
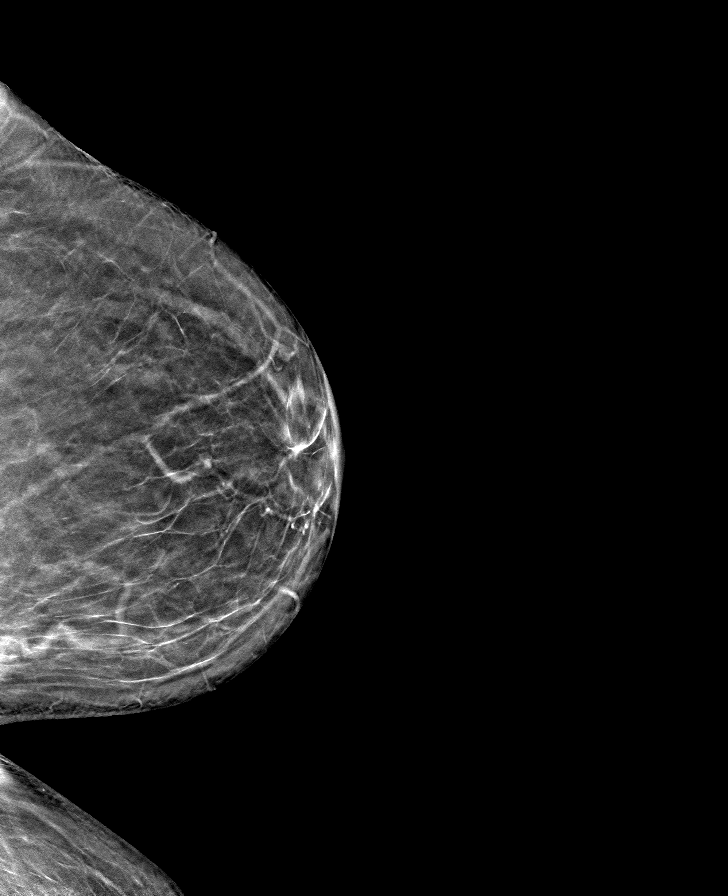

[R MLO tomo · tomo slice 35/68.0]
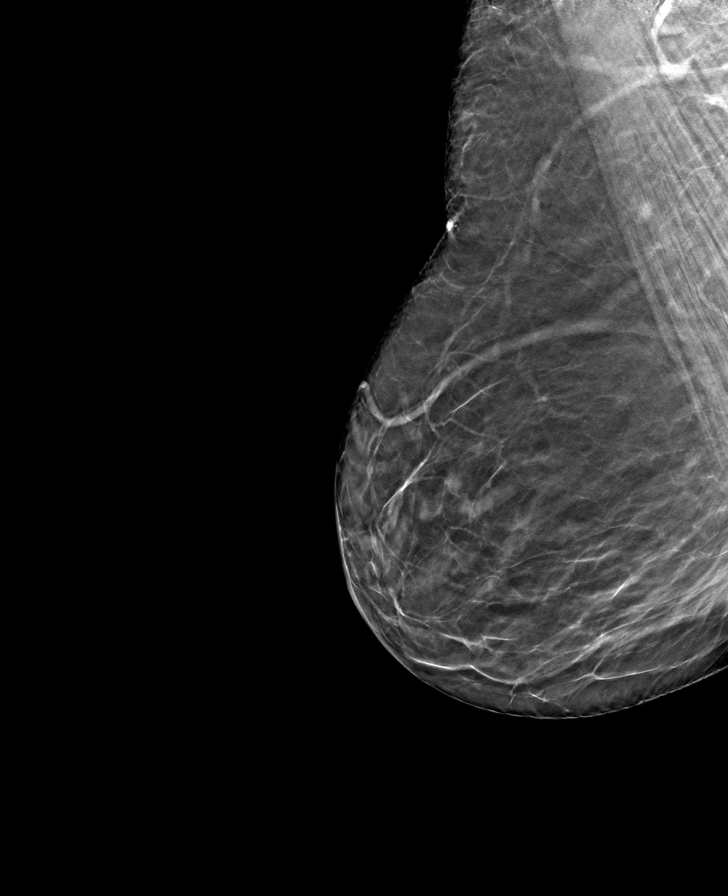

[L MLO tomo · tomo slice 36/71.0]
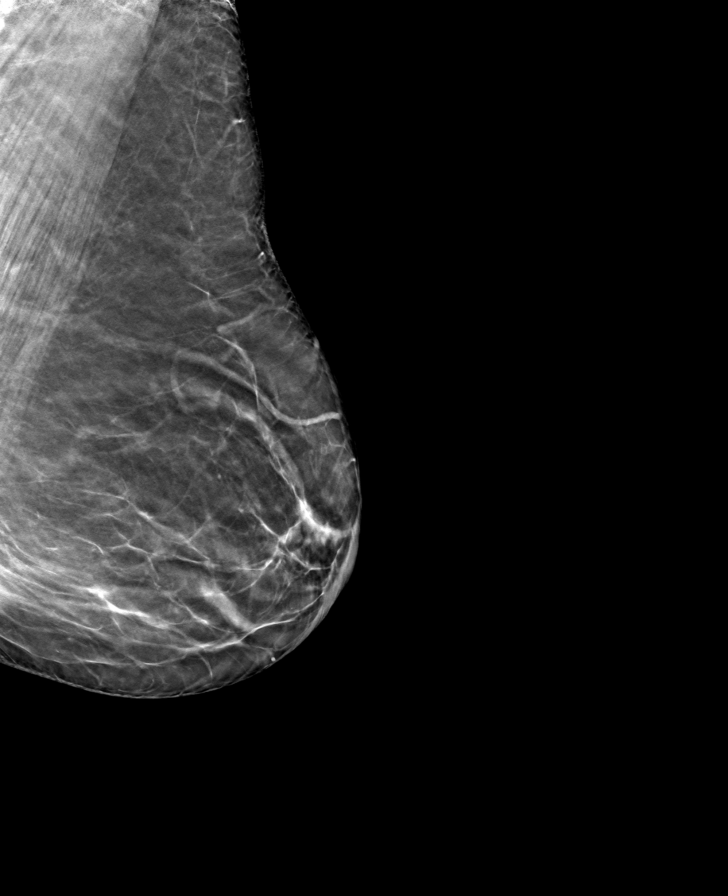

[R CC tomo · tomo slice 31/62.0]
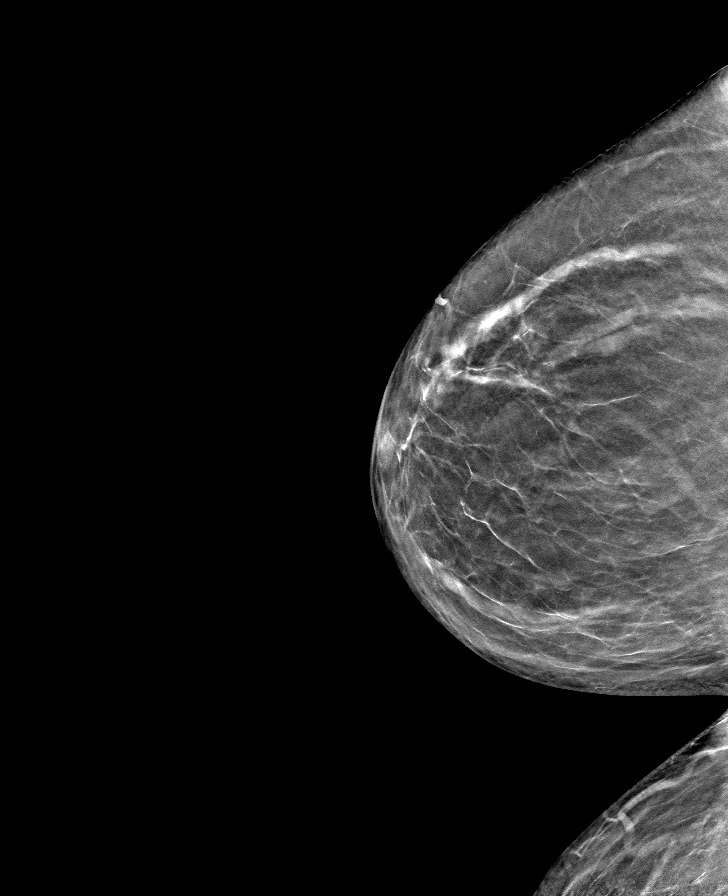

[8 of 24 positions shown; findings below may reference images not displayed]

ACR Breast Density Category b: There are scattered areas of
fibroglandular density.
FINDINGS: There are no findings suspicious for malignancy. Images were
processed with CAD.
IMPRESSION: No mammographic evidence of malignancy. A result letter of this
screening mammogram will be mailed directly to the patient.

RECOMMENDATION:
Screening mammogram in one year. (Code:[id number])

BI-RADS CATEGORY  1: Negative.

## 2020-06-03 ENCOUNTER — Telehealth: Payer: Self-pay | Admitting: Family Medicine

## 2020-06-03 DIAGNOSIS — Z1211 Encounter for screening for malignant neoplasm of colon: Secondary | ICD-10-CM

## 2020-06-03 NOTE — Addendum Note (Signed)
Addended by: Marshall Cork on: 06/03/2020 03:16 PM   Modules accepted: Orders

## 2020-06-03 NOTE — Telephone Encounter (Addendum)
Referral for [facility] for Colonoscopy. Order entered.

## 2020-06-03 NOTE — Telephone Encounter (Signed)
Copied from CRM [id number]. Topic: Referral - Request for Referral >> Jun 03, 2020  2:27 PM Leafy Ro wrote: Has patient seen PCP for this complaint? No . Pt has [employer] insurance Referral for which specialty [facility] specialist  pt would like to see dr wall or dr Tobi Bastos. Pt does not want to go back to [facility] . Reason for referral: Pt is due for her 5 yrs colonoscopy

## 2020-06-09 ENCOUNTER — Telehealth (INDEPENDENT_AMBULATORY_CARE_PROVIDER_SITE_OTHER): Payer: Self-pay | Admitting: Gastroenterology

## 2020-06-09 ENCOUNTER — Other Ambulatory Visit: Payer: Self-pay

## 2020-06-09 DIAGNOSIS — Z1211 Encounter for screening for malignant neoplasm of colon: Secondary | ICD-10-CM

## 2020-06-09 NOTE — Progress Notes (Signed)
Gastroenterology Pre-Procedure Review  Request Date: Thursday 07/01/20 Requesting Physician: Dr. Vicente Males  PATIENT REVIEW QUESTIONS: The patient responded to the following health history questions as indicated:    1. Are you having any GI issues? no 2. Do you have a personal history of Polyps? no 3. Do you have a family history of Colon Cancer or Polyps? yes (father had colon polyps and had to have a colon resection) 4. Diabetes Mellitus? yes (takes trulicity and Jardiance) 5. Joint replacements in the past 12 months?no 6. Major health problems in the past 3 months?no 7. Any artificial heart valves, MVP, or defibrillator?no    MEDICATIONS & ALLERGIES:    Patient reports the following regarding taking any anticoagulation/antiplatelet therapy:   Plavix, Coumadin, Eliquis, Xarelto, Lovenox, Pradaxa, Brilinta, or Effient? yes (81 mg) Aspirin? yes (81 mg)  Patient confirms/reports the following medications:  Current Outpatient Medications  Medication Sig Dispense Refill  . Ascorbic Acid (CHEWABLE VITAMIN C PO) Take by mouth daily.    Marland Kitchen aspirin EC 81 MG tablet Take 81 mg by mouth daily.    Marland Kitchen aspirin-acetaminophen-caffeine (EXCEDRIN MIGRAINE) 250-250-65 MG tablet Take 2 tablets by mouth every 6 (six) hours as needed for headache.    . cyclobenzaprine (FLEXERIL) 5 MG tablet Take 1 tablet (5 mg total) by mouth 3 (three) times daily as needed. 15 tablet 0  . Dulaglutide (TRULICITY) 5.36 IW/8.0HO SOPN Inject 0.5 mLs (0.75 mg total) into the skin once a week. 4 pen 3  . empagliflozin (JARDIANCE) 25 MG TABS tablet Take 1 tablet (25 mg total) by mouth daily. 90 tablet 1  . EPINEPHrine 0.3 mg/0.3 mL IJ SOAJ injection INJECT INTRAMUSCULARLY AS DIRECTED  1  . escitalopram (LEXAPRO) 20 MG tablet Take 1 tablet (20 mg total) by mouth daily. 90 tablet 1  . fexofenadine (ALLEGRA) 180 MG tablet Take 180 mg by mouth daily.    Marland Kitchen LORazepam (ATIVAN) 0.5 MG tablet Take 1 tablet (0.5 mg total) by mouth 2 (two) times  daily as needed for anxiety. 20 tablet 1  . Multiple Vitamins-Minerals (OCUVITE ADULT 50+ PO) Take 1 capsule by mouth daily.    . Multiple Vitamins-Minerals (ZINC PO) Take by mouth daily.    . simvastatin (ZOCOR) 40 MG tablet TAKE 1 TABLET(40 MG) BY MOUTH AT BEDTIME 90 tablet 1  . UNABLE TO FIND Take by mouth daily. Vitamin D3, vitamin B, Bioflex    . UNABLE TO FIND Take by mouth daily. Ocutive eye vitamins, cinnamon vitamins    . blood glucose meter kit and supplies KIT Dispense based on patient and insurance preference. Use up to four times daily as directed. (FOR ICD-9 250.00, 250.01). 1 each 0  . meclizine (ANTIVERT) 12.5 MG tablet Take 1 tablet (12.5 mg total) by mouth 3 (three) times daily as needed for dizziness. (Patient not taking: Reported on 04/14/2020) 30 tablet 0  . OVER THE COUNTER MEDICATION Take 2 capsules by mouth daily. Hearing Health Supplement     No current facility-administered medications for this visit.    Patient confirms/reports the following allergies:  Allergies  Allergen Reactions  . Carafate [Sucralfate] Anaphylaxis and Hives  . Atorvastatin Other (See Comments)    Unknown  . Metformin And Related Diarrhea  . Other Hives and Other (See Comments)    Lysol Cleaning Products  . Penicillins Nausea And Vomiting  . Tramadol Nausea Only    No orders of the defined types were placed in this encounter.   AUTHORIZATION INFORMATION Primary Insurance: 1D#: Group #:  Secondary Insurance: 1D#: Group #:  SCHEDULE INFORMATION: Date:  Time: Location:

## 2020-06-10 ENCOUNTER — Other Ambulatory Visit: Payer: Self-pay

## 2020-06-13 DIAGNOSIS — N390 Urinary tract infection, site not specified: Secondary | ICD-10-CM | POA: Diagnosis not present

## 2020-06-13 DIAGNOSIS — R300 Dysuria: Secondary | ICD-10-CM | POA: Diagnosis not present

## 2020-06-29 ENCOUNTER — Other Ambulatory Visit: Payer: Self-pay

## 2020-06-29 ENCOUNTER — Other Ambulatory Visit
Admission: RE | Admit: 2020-06-29 | Discharge: 2020-06-29 | Disposition: A | Discharge status: Home / Self Care | Payer: BC Managed Care – PPO | Source: Ambulatory Visit | Attending: Gastroenterology | Admitting: Gastroenterology

## 2020-06-29 DIAGNOSIS — Z20822 Contact with and (suspected) exposure to covid-19: Secondary | ICD-10-CM | POA: Diagnosis not present

## 2020-06-29 DIAGNOSIS — Z01812 Encounter for preprocedural laboratory examination: Secondary | ICD-10-CM | POA: Insufficient documentation

## 2020-06-30 ENCOUNTER — Encounter: Payer: Self-pay | Admitting: Gastroenterology

## 2020-06-30 LAB — SARS CORONAVIRUS 2 (TAT 6-24 HRS): SARS Coronavirus 2: NEGATIVE

## 2020-07-01 ENCOUNTER — Ambulatory Visit
Admission: RE | Admit: 2020-07-01 | Discharge: 2020-07-01 | Disposition: A | Discharge status: Home / Self Care | Payer: BC Managed Care – PPO | Attending: Gastroenterology | Admitting: Gastroenterology

## 2020-07-01 ENCOUNTER — Encounter: Payer: Self-pay | Admitting: Gastroenterology

## 2020-07-01 ENCOUNTER — Encounter
Admission: RE | Disposition: A | Discharge status: Home / Self Care | Payer: Self-pay | Source: Home / Self Care | Attending: Gastroenterology

## 2020-07-01 ENCOUNTER — Ambulatory Visit: Payer: BC Managed Care – PPO | Admitting: Anesthesiology

## 2020-07-01 ENCOUNTER — Other Ambulatory Visit: Payer: Self-pay

## 2020-07-01 DIAGNOSIS — M170 Bilateral primary osteoarthritis of knee: Secondary | ICD-10-CM | POA: Diagnosis not present

## 2020-07-01 DIAGNOSIS — E785 Hyperlipidemia, unspecified: Secondary | ICD-10-CM | POA: Insufficient documentation

## 2020-07-01 DIAGNOSIS — I10 Essential (primary) hypertension: Secondary | ICD-10-CM | POA: Diagnosis not present

## 2020-07-01 DIAGNOSIS — Z880 Allergy status to penicillin: Secondary | ICD-10-CM | POA: Diagnosis not present

## 2020-07-01 DIAGNOSIS — Z885 Allergy status to narcotic agent status: Secondary | ICD-10-CM | POA: Insufficient documentation

## 2020-07-01 DIAGNOSIS — E119 Type 2 diabetes mellitus without complications: Secondary | ICD-10-CM | POA: Insufficient documentation

## 2020-07-01 DIAGNOSIS — Z7982 Long term (current) use of aspirin: Secondary | ICD-10-CM | POA: Insufficient documentation

## 2020-07-01 DIAGNOSIS — K635 Polyp of colon: Secondary | ICD-10-CM

## 2020-07-01 DIAGNOSIS — Z1211 Encounter for screening for malignant neoplasm of colon: Secondary | ICD-10-CM | POA: Insufficient documentation

## 2020-07-01 DIAGNOSIS — Z888 Allergy status to other drugs, medicaments and biological substances status: Secondary | ICD-10-CM | POA: Diagnosis not present

## 2020-07-01 DIAGNOSIS — G43909 Migraine, unspecified, not intractable, without status migrainosus: Secondary | ICD-10-CM | POA: Insufficient documentation

## 2020-07-01 DIAGNOSIS — Z8371 Family history of colonic polyps: Secondary | ICD-10-CM | POA: Diagnosis not present

## 2020-07-01 DIAGNOSIS — I2510 Atherosclerotic heart disease of native coronary artery without angina pectoris: Secondary | ICD-10-CM | POA: Diagnosis not present

## 2020-07-01 DIAGNOSIS — Z79899 Other long term (current) drug therapy: Secondary | ICD-10-CM | POA: Insufficient documentation

## 2020-07-01 HISTORY — PX: COLONOSCOPY WITH PROPOFOL: SHX5780

## 2020-07-01 LAB — GLUCOSE, CAPILLARY: Glucose-Capillary: 205 mg/dL — ABNORMAL HIGH (ref 70–99)

## 2020-07-01 SURGERY — COLONOSCOPY WITH PROPOFOL
Anesthesia: General

## 2020-07-01 MED ORDER — PROPOFOL 500 MG/50ML IV EMUL
INTRAVENOUS | Status: DC | PRN
  Administered 2020-07-01: 150 ug/kg/min via INTRAVENOUS

## 2020-07-01 MED ORDER — PROPOFOL 500 MG/50ML IV EMUL
INTRAVENOUS | Status: AC
  Filled 2020-07-01: qty 50

## 2020-07-01 MED ORDER — SODIUM CHLORIDE 0.9 % IV SOLN: INTRAVENOUS | Status: DC

## 2020-07-01 MED ORDER — PROPOFOL 10 MG/ML IV BOLUS
INTRAVENOUS | Status: DC | PRN
  Administered 2020-07-01: 50 mg via INTRAVENOUS

## 2020-07-01 NOTE — Op Note (Signed)
[facility] Gastroenterology Patient Name: Brianna Leon Procedure Date: 07/01/2020 10:51 AM MRN: [id number] Account #: [id number] Date of Birth: [birth year] Admit Type: Outpatient Age: 64 Room: [facility] ENDO ROOM 4 Gender: Female Note Status: Finalized Procedure:             Colonoscopy Indications:           Colon cancer screening in patient at increased risk:                         Family history of 1st-degree relative with colon polyps Providers:             [name] MD, MD Referring MD:          Dorcas Carrow (Referring MD) Medicines:             Monitored Anesthesia Care Complications:         No immediate complications. Procedure:             Pre-Anesthesia Assessment:                        - Prior to the procedure, a History and Physical was                         performed, and patient medications, allergies and                         sensitivities were reviewed. The patient's tolerance                         of previous anesthesia was reviewed.                        - The risks and benefits of the procedure and the                         sedation options and risks were discussed with the                         patient. All questions were answered and informed                         consent was obtained.                        - ASA Grade Assessment: II - A patient with mild                         systemic disease.                        After obtaining informed consent, the colonoscope was                         passed under direct vision. Throughout the procedure,                         the patient's blood pressure, pulse, and oxygen                         saturations  were monitored continuously. The                         Colonoscope was introduced through the anus and                         advanced to the the cecum, identified by the                         appendiceal orifice. The colonoscopy was performed                          with ease. The patient tolerated the procedure well.                         The quality of the bowel preparation was adequate. Findings:      The perianal and digital rectal examinations were normal.      A 4 mm polyp was found in the cecum. The polyp was sessile. The polyp       was removed with a cold snare. Resection and retrieval were complete.      The exam was otherwise without abnormality on direct and retroflexion       views. Impression:            - One 4 mm polyp in the cecum, removed with a cold                         snare. Resected and retrieved.                        - The examination was otherwise normal on direct and                         retroflexion views. Recommendation:        - Discharge patient to home (with escort).                        - Resume previous diet.                        - Continue present medications.                        - Await pathology results.                        - Repeat colonoscopy for surveillance based on                         pathology results. Procedure Code(s):     --- Professional ---                        [phone], Colonoscopy, flexible; with removal of                         tumor(s), polyp(s), or other lesion(s) by snare                         technique Diagnosis Code(s):     --- Professional ---  Z83.71, Family history of colonic polyps                        K63.5, Polyp of colon CPT copyright 2019 American Medical Association. All rights reserved. The codes documented in this report are preliminary and upon coder review may  be revised to meet current compliance requirements. [name], MD [name] MD, MD 07/01/2020 11:28:40 AM This report has been signed electronically. Number of Addenda: 0 Note Initiated On: 07/01/2020 10:51 AM Scope Withdrawal Time: 0 hours 14 minutes 42 seconds  Total Procedure Duration: 0 hours 16 minutes 17 seconds  Estimated Blood Loss:  Estimated blood loss:  none.      [facility]

## 2020-07-01 NOTE — Transfer of Care (Signed)
Immediate Anesthesia Transfer of Care Note  Patient: Brianna Leon  Procedure(s) Performed: COLONOSCOPY WITH PROPOFOL (N/A )  Patient Location: PACU  Anesthesia Type:General  Level of Consciousness: awake and alert   Airway & Oxygen Therapy: Patient Spontanous Breathing and Patient connected to nasal cannula oxygen  Post-op Assessment: Report given to RN and Post -op Vital signs reviewed and stable  Post vital signs: Reviewed and stable  Last Vitals:  Vitals Value Taken Time  BP 102/67 07/01/20 1131  Temp    Pulse 78 07/01/20 1131  Resp 13 07/01/20 1131  SpO2 97 % 07/01/20 1131  Vitals shown include unvalidated device data.  Last Pain:  Vitals:   07/01/20 1046  TempSrc: Tympanic         Complications: No complications documented.

## 2020-07-01 NOTE — Anesthesia Preprocedure Evaluation (Addendum)
Anesthesia Evaluation  Patient identified by MRN, date of birth, ID band Patient awake    Reviewed: Allergy & Precautions, H&P , NPO status , Patient's Chart, lab work & pertinent test results  History of Anesthesia Complications Negative for: history of anesthetic complications  Airway Mallampati: III  TM Distance: <3 FB Neck ROM: full    Dental  (+) Chipped   Pulmonary neg pulmonary ROS, neg shortness of breath,           Cardiovascular Exercise Tolerance: Good hypertension, (-) angina+ CAD  (-) Past MI and (-) DOE      Neuro/Psych  Headaches, negative psych ROS   GI/Hepatic Neg liver ROS, GERD  Medicated and Controlled,  Endo/Other  diabetes, Type 2  Renal/GU Renal disease     Musculoskeletal  (+) Arthritis ,   Abdominal   Peds  Hematology negative hematology ROS (+)   Anesthesia Other Findings Past Medical History: No date: Arthritis     Comment:  KNEES No date: CAD (coronary artery disease) 2000: DM (diabetes mellitus), type 2 (HCC)     Comment:  insulin started 2016 No date: Elevated liver enzymes No date: Family history of colonic polyps     Comment:  father No date: GERD (gastroesophageal reflux disease) No date: Hemorrhage     Comment:  DURING PREGNANCY 31 YEARS AGO No date: Hyperlipidemia No date: IBS (irritable bowel syndrome) No date: Migraine  Past Surgical History: 22 years ago: ABDOMINAL HYSTERECTOMY No date: CARDIAC CATHETERIZATION     Comment:  x 2 No date: CARPAL TUNNEL RELEASE; Right 05/18/2015: COLONOSCOPY     Comment:  repeat 5 years 05/18/2015: ESOPHAGOGASTRODUODENOSCOPY 2001 and 2009: KNEE ARTHROSCOPY No date: TONSILLECTOMY  BMI    Body Mass Index:  22.68 kg/m      Reproductive/Obstetrics negative OB ROS                             Anesthesia Physical  Anesthesia Plan  ASA: III  Anesthesia Plan: General   Post-op Pain Management:     Induction: Intravenous  PONV Risk Score and Plan: Propofol infusion  Airway Management Planned: Nasal Cannula  Additional Equipment:   Intra-op Plan:   Post-operative Plan:   Informed Consent: I have reviewed the patients History and Physical, chart, labs and discussed the procedure including the risks, benefits and alternatives for the proposed anesthesia with the patient or authorized representative who has indicated his/her understanding and acceptance.     Dental Advisory Given  Plan Discussed with: Anesthesiologist, CRNA and Surgeon  Anesthesia Plan Comments: (Patient consented for risks of anesthesia including but not limited to:  - adverse reactions to medications - damage to teeth, lips or other oral mucosa - sore throat or hoarseness - Damage to heart, brain, lungs or loss of life  Patient voiced understanding.)        Anesthesia Quick Evaluation

## 2020-07-01 NOTE — Anesthesia Postprocedure Evaluation (Signed)
Anesthesia Post Note  Patient: Brianna Leon  Procedure(s) Performed: COLONOSCOPY WITH PROPOFOL (N/A )  Patient location during evaluation: Endoscopy Anesthesia Type: General Level of consciousness: awake and alert and oriented Pain management: pain level controlled Vital Signs Assessment: post-procedure vital signs reviewed and stable Respiratory status: spontaneous breathing Cardiovascular status: blood pressure returned to baseline Anesthetic complications: no   No complications documented.   Last Vitals:  Vitals:   07/01/20 1140 07/01/20 1150  BP: 123/86 123/90  Pulse: 80 72  Resp: 12 (!) 9  Temp:    SpO2: 98% 100%    Last Pain:  Vitals:   07/01/20 1046  TempSrc: Tympanic                 [name]

## 2020-07-01 NOTE — H&P (Signed)
Brianna Bellows, MD [address], [place], [place], [place], [address] [address], [place], [place], [place], [address] Phone: [phone]  Fax: [phone]  Primary Care Physician:  Valerie Roys, DO   Pre-Procedure History & Physical: HPI:  Brianna Leon is a 64 y.o. female is here for an colonoscopy.   Past Medical History:  Diagnosis Date  . Arthritis    KNEES  . Biliary colic 4/32/7614  . CAD (coronary artery disease)   . DM (diabetes mellitus), type 2 ([place]) 2000   insulin started 2016  . Elevated liver enzymes   . Family history of colonic polyps    father  . GERD (gastroesophageal reflux disease)   . Hemorrhage    DURING PREGNANCY 31 YEARS AGO  . Hyperlipidemia   . IBS (irritable bowel syndrome)   . Migraine   . RUQ pain 06/12/2017    Past Surgical History:  Procedure Laterality Date  . ABDOMINAL HYSTERECTOMY  22 years ago  . CARDIAC CATHETERIZATION     x 2  . CARPAL TUNNEL RELEASE Right   . CHOLECYSTECTOMY N/A 09/20/2017   Procedure: LAPAROSCOPIC CHOLECYSTECTOMY WITH INTRAOPERATIVE CHOLANGIOGRAM;  Surgeon: Robert Bellow, MD;  Location: [facility] ORS;  Service: General;  Laterality: N/A;  . COLONOSCOPY  05/18/2015   repeat 5 years  . ESOPHAGOGASTRODUODENOSCOPY  05/18/2015  . KNEE ARTHROSCOPY  2001 and 2009  . TONSILLECTOMY      Prior to Admission medications   Medication Sig Start Date End Date Taking? Authorizing Provider  Ascorbic Acid (CHEWABLE VITAMIN C PO) Take by mouth daily.    [provider]  aspirin EC 81 MG tablet Take 81 mg by mouth daily.    [provider]  aspirin-acetaminophen-caffeine (EXCEDRIN MIGRAINE) [phone] MG tablet Take 2 tablets by mouth every 6 (six) hours as needed for headache.    [provider]  blood glucose meter kit and supplies KIT Dispense based on patient and insurance preference. Use up to four times daily as directed. (FOR ICD-9 250.00, 250.01). 10/24/16   Harvest Dark, MD   cyclobenzaprine (FLEXERIL) 5 MG tablet Take 1 tablet (5 mg total) by mouth 3 (three) times daily as needed. 12/13/19   Menshew, Dannielle Karvonen, PA-C  Dulaglutide (TRULICITY) 7.34 YZ/7.0DU SOPN Inject 0.5 mLs (0.75 mg total) into the skin once a week. 04/15/20   Johnson, Megan P, DO  empagliflozin (JARDIANCE) 25 MG TABS tablet Take 1 tablet (25 mg total) by mouth daily. 04/15/20   Johnson, Megan P, DO  EPINEPHrine 0.3 mg/0.3 mL IJ SOAJ injection INJECT INTRAMUSCULARLY AS DIRECTED 07/30/18   [provider]  escitalopram (LEXAPRO) 20 MG tablet Take 1 tablet (20 mg total) by mouth daily. 04/15/20   Johnson, Megan P, DO  fexofenadine (ALLEGRA) 180 MG tablet Take 180 mg by mouth daily.    [provider]  LORazepam (ATIVAN) 0.5 MG tablet Take 1 tablet (0.5 mg total) by mouth 2 (two) times daily as needed for anxiety. 04/15/20   Johnson, Megan P, DO  meclizine (ANTIVERT) 12.5 MG tablet Take 1 tablet (12.5 mg total) by mouth 3 (three) times daily as needed for dizziness. Patient not taking: Reported on 04/14/2020 08/10/19   Marnee Guarneri T, NP  Multiple Vitamins-Minerals (OCUVITE ADULT 50+ PO) Take 1 capsule by mouth daily.    [provider]  Multiple Vitamins-Minerals (ZINC PO) Take by mouth daily.    [provider]  OVER THE COUNTER MEDICATION Take 2 capsules by  mouth daily. Hearing Health Supplement    [provider]  simvastatin (ZOCOR) 40 MG tablet TAKE 1 TABLET(40 MG) BY MOUTH AT BEDTIME 04/15/20   Johnson, Megan P, DO  UNABLE TO FIND Take by mouth daily. Vitamin D3, vitamin B, Bioflex    [provider]  UNABLE TO FIND Take by mouth daily. Ocutive eye vitamins, cinnamon vitamins    [provider]    Allergies as of 06/10/2020 - Review Complete 06/09/2020  Allergen Reaction Noted  . Carafate [sucralfate] Anaphylaxis and Hives 06/26/2017  . Atorvastatin Other (See Comments) 04/01/2014  . Metformin and related Diarrhea 04/06/2015  . Other  Hives and Other (See Comments) 06/11/2017  . Penicillins Nausea And Vomiting 04/06/2015  . Tramadol Nausea Only 04/01/2014    Family History  Problem Relation Age of Onset  . Colon polyps Father   . Diabetes Father   . Hypertension Father   . Heart disease Father   . Stroke Father   . Diabetes Mother   . Hypertension Mother   . Diabetes Brother   . Heart failure Brother        pacemaker  . Hypertension Brother   . Heart attack Brother   . Migraines Daughter   . Lupus Son   . Diabetes Maternal Grandmother   . Heart disease Maternal Grandmother   . Stroke Maternal Grandfather   . Hypertension Paternal Grandmother   . Stroke Paternal Grandfather   . Hyperlipidemia Brother   . Breast cancer Neg Hx     Social History   Socioeconomic History  . Marital status: Married    Spouse name: Not on file  . Number of children: Not on file  . Years of education: Not on file  . Highest education level: Not on file  Occupational History  . Not on file  Tobacco Use  . Smoking status: Never Smoker  . Smokeless tobacco: Never Used  Vaping Use  . Vaping Use: Never used  Substance and Sexual Activity  . Alcohol use: No  . Drug use: No  . Sexual activity: Yes  Other Topics Concern  . Not on file  Social History Narrative  . Not on file   Social Determinants of Health   Financial Resource Strain:   . Difficulty of Paying Living Expenses: Not on file  Food Insecurity:   . Worried About Charity fundraiser in the Last Year: Not on file  . Ran Out of Food in the Last Year: Not on file  Transportation Needs:   . Lack of Transportation (Medical): Not on file  . Lack of Transportation (Non-Medical): Not on file  Physical Activity:   . Days of Exercise per Week: Not on file  . Minutes of Exercise per Session: Not on file  Stress:   . Feeling of Stress : Not on file  Social Connections:   . Frequency of Communication with Friends and Family: Not on file  . Frequency of Social  Gatherings with Friends and Family: Not on file  . Attends Religious Services: Not on file  . Active Member of Clubs or Organizations: Not on file  . Attends Archivist Meetings: Not on file  . Marital Status: Not on file  Intimate Partner Violence:   . Fear of Current or Ex-Partner: Not on file  . Emotionally Abused: Not on file  . Physically Abused: Not on file  . Sexually Abused: Not on file    Review of Systems: See HPI, otherwise negative  ROS  Physical Exam: BP (!) 132/94   Pulse 98   Temp (!) 97.2 F (36.2 C) (Tympanic)   Resp 16   Ht '5\' 2"'  (1.575 m)   Wt 56.7 kg   SpO2 100%   BMI 22.86 kg/m  General:   Alert,  pleasant and cooperative in NAD Head:  Normocephalic and atraumatic. Neck:  Supple; no masses or thyromegaly. Lungs:  Clear throughout to auscultation, normal respiratory effort.    Heart:  +S1, +S2, Regular rate and rhythm, No edema. Abdomen:  Soft, nontender and nondistended. Normal bowel sounds, without guarding, and without rebound.   Neurologic:  Alert and  oriented x4;  grossly normal neurologically.  Impression/Plan: Brianna Leon is here for an colonoscopy to be performed for Screening colonoscopy  Father had colon polyps Risks, benefits, limitations, and alternatives regarding  colonoscopy have been reviewed with the patient.  Questions have been answered.  All parties agreeable.   Brianna Bellows, MD  07/01/2020, 10:52 AM

## 2020-07-02 ENCOUNTER — Encounter: Payer: Self-pay | Admitting: Gastroenterology

## 2020-07-02 DIAGNOSIS — I2510 Atherosclerotic heart disease of native coronary artery without angina pectoris: Secondary | ICD-10-CM | POA: Diagnosis not present

## 2020-07-02 DIAGNOSIS — R0602 Shortness of breath: Secondary | ICD-10-CM | POA: Diagnosis not present

## 2020-07-02 DIAGNOSIS — E7800 Pure hypercholesterolemia, unspecified: Secondary | ICD-10-CM | POA: Diagnosis not present

## 2020-07-02 DIAGNOSIS — I208 Other forms of angina pectoris: Secondary | ICD-10-CM | POA: Diagnosis not present

## 2020-07-02 LAB — SURGICAL PATHOLOGY

## 2020-07-06 ENCOUNTER — Encounter: Payer: Self-pay | Admitting: Gastroenterology

## 2020-07-09 ENCOUNTER — Ambulatory Visit: Payer: BC Managed Care – PPO | Admitting: Family Medicine

## 2020-07-30 ENCOUNTER — Telehealth: Payer: Self-pay

## 2020-07-30 ENCOUNTER — Encounter: Payer: Self-pay | Admitting: Family Medicine

## 2020-07-30 ENCOUNTER — Other Ambulatory Visit: Payer: Self-pay

## 2020-07-30 ENCOUNTER — Ambulatory Visit: Payer: BC Managed Care – PPO | Admitting: Family Medicine

## 2020-07-30 VITALS — BP 115/65 | HR 80 | Temp 98.1°F | Wt 129.0 lb

## 2020-07-30 DIAGNOSIS — R809 Proteinuria, unspecified: Secondary | ICD-10-CM

## 2020-07-30 DIAGNOSIS — Z23 Encounter for immunization: Secondary | ICD-10-CM

## 2020-07-30 DIAGNOSIS — E1129 Type 2 diabetes mellitus with other diabetic kidney complication: Secondary | ICD-10-CM

## 2020-07-30 MED ORDER — TRULICITY 1.5 MG/0.5ML ~~LOC~~ SOAJ: 1.5000 mg | SUBCUTANEOUS | 1 refills | Status: AC

## 2020-07-30 NOTE — Telephone Encounter (Signed)
PA for Trulicity submitted and approved. Key: Brianna Leon

## 2020-07-30 NOTE — Progress Notes (Signed)
BP 115/65   Pulse 80   Temp 98.1 F (36.7 C) (Oral)   Wt 129 lb (58.5 kg)   SpO2 96%   BMI 23.59 kg/m    Subjective:    Patient ID: Brianna Leon, female    DOB: 05/07/1956, 64 y.o.   MRN: [id number]  HPI: Brianna Leon is a 64 y.o. female  Chief Complaint  Patient presents with  . Diabetes   DIABETES Hypoglycemic episodes:yes- 1-2x a week when she forgets to eat Polydipsia/polyuria: no Visual disturbance: no Chest pain: no Paresthesias: no Glucose Monitoring: yes  Accucheck frequency: Daily  Fasting glucose: up and down Taking Insulin?: no Blood Pressure Monitoring: not checking Retinal Examination: Up to Date Foot Exam: Up to Date Diabetic Education: Completed Pneumovax: Up to Date Influenza: Up to Date Aspirin: yes   Relevant past medical, surgical, family and social history reviewed and updated as indicated. Interim medical history since our last visit reviewed. Allergies and medications reviewed and updated.  Review of Systems  Constitutional: Negative.   Respiratory: Negative.   Cardiovascular: Negative.   Gastrointestinal: Negative.   Musculoskeletal: Negative.   Neurological: Negative.   Psychiatric/Behavioral: Negative.     Per HPI unless specifically indicated above     Objective:    BP 115/65   Pulse 80   Temp 98.1 F (36.7 C) (Oral)   Wt 129 lb (58.5 kg)   SpO2 96%   BMI 23.59 kg/m   Wt Readings from Last 3 Encounters:  07/30/20 129 lb (58.5 kg)  07/01/20 125 lb (56.7 kg)  12/13/19 130 lb (59 kg)    Physical Exam Vitals and nursing note reviewed.  Constitutional:      General: She is not in acute distress.    Appearance: Normal appearance. She is not ill-appearing, toxic-appearing or diaphoretic.  HENT:     Head: Normocephalic and atraumatic.     Right Ear: External ear normal.     Left Ear: External ear normal.     Nose: Nose normal.     Mouth/Throat:     Mouth: Mucous membranes are moist.     Pharynx: Oropharynx is  clear.  Eyes:     General: No scleral icterus.       Right eye: No discharge.        Left eye: No discharge.     Extraocular Movements: Extraocular movements intact.     Conjunctiva/sclera: Conjunctivae normal.     Pupils: Pupils are equal, round, and reactive to light.  Cardiovascular:     Rate and Rhythm: Normal rate and regular rhythm.     Pulses: Normal pulses.     Heart sounds: Normal heart sounds. No murmur heard.  No friction rub. No gallop.   Pulmonary:     Effort: Pulmonary effort is normal. No respiratory distress.     Breath sounds: Normal breath sounds. No stridor. No wheezing, rhonchi or rales.  Chest:     Chest wall: No tenderness.  Musculoskeletal:        General: Normal range of motion.     Cervical back: Normal range of motion and neck supple.  Skin:    General: Skin is warm and dry.     Capillary Refill: Capillary refill takes less than 2 seconds.     Coloration: Skin is not jaundiced or pale.     Findings: No bruising, erythema, lesion or rash.  Neurological:     General: No focal deficit present.     Mental  Status: She is alert and oriented to person, place, and time. Mental status is at baseline.  Psychiatric:        Mood and Affect: Mood normal.        Behavior: Behavior normal.        Thought Content: Thought content normal.        Judgment: Judgment normal.     Results for orders placed or performed in visit on 07/30/20  Bayer DCA Hb A1c Waived  Result Value Ref Range   HB A1C (BAYER DCA - WAIVED) 7.8 (H) <7.0 %      Assessment & Plan:   Problem List Items Addressed This Visit      Endocrine   DM (diabetes mellitus), type 2 (HCC) - Primary    Going in the wrong direction with A1c of 7.8. Will increase her trulicity to 1.5mg  and recheck 3 months. Call with any concerns. Continue to monitor.       Relevant Medications   Dulaglutide (TRULICITY) 1.5 MG/0.5ML SOPN   Other Relevant Orders   Bayer DCA Hb A1c Waived (Completed)    Other Visit  Diagnoses    Flu vaccine need       Flu shot given today.    Relevant Orders   Flu Vaccine QUAD 36+ mos IM (Completed)       Follow up plan: Return in about 3 months (around 10/29/2020).

## 2020-07-31 ENCOUNTER — Encounter: Payer: Self-pay | Admitting: Family Medicine

## 2020-07-31 LAB — BAYER DCA HB A1C WAIVED: HB A1C (BAYER DCA - WAIVED): 7.8 % — ABNORMAL HIGH (ref <7.0)

## 2020-07-31 NOTE — Assessment & Plan Note (Signed)
Going in the wrong direction with A1c of 7.8. Will increase her trulicity to 1.5mg  and recheck 3 months. Call with any concerns. Continue to monitor.

## 2020-08-12 DIAGNOSIS — R300 Dysuria: Secondary | ICD-10-CM | POA: Diagnosis not present

## 2020-11-12 ENCOUNTER — Encounter: Payer: Self-pay | Admitting: Family Medicine

## 2020-11-12 ENCOUNTER — Ambulatory Visit: Payer: BC Managed Care – PPO | Admitting: Family Medicine

## 2020-11-12 ENCOUNTER — Other Ambulatory Visit: Payer: Self-pay

## 2020-11-12 VITALS — BP 127/73 | HR 82 | Temp 97.8°F | Wt 126.8 lb

## 2020-11-12 DIAGNOSIS — F419 Anxiety disorder, unspecified: Secondary | ICD-10-CM | POA: Diagnosis not present

## 2020-11-12 DIAGNOSIS — I129 Hypertensive chronic kidney disease with stage 1 through stage 4 chronic kidney disease, or unspecified chronic kidney disease: Secondary | ICD-10-CM

## 2020-11-12 DIAGNOSIS — E1129 Type 2 diabetes mellitus with other diabetic kidney complication: Secondary | ICD-10-CM

## 2020-11-12 DIAGNOSIS — E782 Mixed hyperlipidemia: Secondary | ICD-10-CM

## 2020-11-12 DIAGNOSIS — R809 Proteinuria, unspecified: Secondary | ICD-10-CM

## 2020-11-12 LAB — BAYER DCA HB A1C WAIVED: HB A1C (BAYER DCA - WAIVED): 7.2 % — ABNORMAL HIGH (ref <7.0)

## 2020-11-12 MED ORDER — SIMVASTATIN 40 MG PO TABS
ORAL_TABLET | ORAL | 1 refills | Status: DC
Start: 2020-11-12 — End: 2021-05-31

## 2020-11-12 MED ORDER — EMPAGLIFLOZIN 25 MG PO TABS
25.0000 mg | ORAL_TABLET | Freq: Every day | ORAL | 1 refills | Status: DC
Start: 2020-11-12 — End: 2021-05-31

## 2020-11-12 MED ORDER — ESCITALOPRAM OXALATE 20 MG PO TABS
20.0000 mg | ORAL_TABLET | Freq: Every day | ORAL | 1 refills | Status: DC
Start: 2020-11-12 — End: 2021-06-15

## 2020-11-12 MED ORDER — EPINEPHRINE 0.3 MG/0.3ML IJ SOAJ
INTRAMUSCULAR | 12 refills | Status: DC
Start: 2020-11-12 — End: 2022-02-01

## 2020-11-12 NOTE — Progress Notes (Signed)
BP 127/73   Pulse 82   Temp 97.8 F (36.6 C)   Wt 126 lb 12.8 oz (57.5 kg)   SpO2 97%   BMI 23.19 kg/m    Subjective:    Patient ID: Brianna Leon, female    DOB: 03/31/1956, 65 y.o.   MRN: [id number]  HPI: BERANIA Leon is a 65 y.o. female  Chief Complaint  Patient presents with  . Diabetes   DIABETES Hypoglycemic episodes:no Polydipsia/polyuria: no Visual disturbance: no Chest pain: no Paresthesias: no Glucose Monitoring: yes  Accucheck frequency: occasionally running 120s-130s Taking Insulin?: no Blood Pressure Monitoring: not checking Retinal Examination: Not up to Date Foot Exam: Not up to Date Diabetic Education: Not Completed Pneumovax: Up to Date Influenza: Up to Date Aspirin: no  HYPERTENSION / [place] Satisfied with current treatment? yes Duration of hypertension: chronic BP monitoring frequency: not checking BP medication side effects: no Duration of hyperlipidemia: years Cholesterol medication side effects: no Cholesterol supplements: none Past cholesterol medications: simvastatin Medication compliance: excellent compliance Aspirin: no Recent stressors: yes Recurrent headaches: no Visual changes: no Palpitations: no Dyspnea: no Chest pain: no Lower extremity edema: no Dizzy/lightheaded: no  DEPRESSION Mood status: stable Satisfied with current treatment?: yes Symptom severity: moderate  Duration of current treatment : chronic Side effects: no Medication compliance: excellent compliance Psychotherapy/counseling: no  Previous psychiatric medications: lexapro Depressed mood: no Anxious mood: no Anhedonia: no Significant weight loss or gain: no Insomnia: no  Fatigue: no Feelings of worthlessness or guilt: no Impaired concentration/indecisiveness: no Suicidal ideations: no Hopelessness: no Crying spells: no Depression screen [facility] 2/9 04/14/2020 12/05/2019 09/12/2019 06/25/2019 03/07/2019  Decreased Interest 0 0 2 0 0  Down,  Depressed, Hopeless 2 0 0 1 0  PHQ - 2 Score 2 0 2 1 0  Altered sleeping 2 0 1 3 0  Tired, decreased energy 2 0 1 3 0  Change in appetite 1 0 0 2 0  Feeling bad or failure about yourself  0 0 0 0 0  Trouble concentrating 0 0 0 0 0  Moving slowly or fidgety/restless 0 0 0 3 0  Suicidal thoughts 0 0 0 0 0  PHQ-9 Score 7 0 4 12 0  Difficult doing work/chores - Not difficult at all Somewhat difficult Very difficult -  Some recent data might be hidden     Relevant past medical, surgical, family and social history reviewed and updated as indicated. Interim medical history since our last visit reviewed. Allergies and medications reviewed and updated.  Review of Systems  Constitutional: Negative.   HENT: Negative.   Respiratory: Negative.   Cardiovascular: Negative.   Gastrointestinal: Negative.   Musculoskeletal: Negative.   Neurological: Negative.   Psychiatric/Behavioral: Negative.     Per HPI unless specifically indicated above     Objective:    BP 127/73   Pulse 82   Temp 97.8 F (36.6 C)   Wt 126 lb 12.8 oz (57.5 kg)   SpO2 97%   BMI 23.19 kg/m   Wt Readings from Last 3 Encounters:  11/12/20 126 lb 12.8 oz (57.5 kg)  07/30/20 129 lb (58.5 kg)  07/01/20 125 lb (56.7 kg)    Physical Exam Vitals and nursing note reviewed.  Constitutional:      General: She is not in acute distress.    Appearance: Normal appearance. She is not ill-appearing, toxic-appearing or diaphoretic.  HENT:     Head: Normocephalic and atraumatic.     Right Ear: External ear  normal.     Left Ear: External ear normal.     Nose: Nose normal.     Mouth/Throat:     Mouth: Mucous membranes are moist.     Pharynx: Oropharynx is clear.  Eyes:     General: No scleral icterus.       Right eye: No discharge.        Left eye: No discharge.     Extraocular Movements: Extraocular movements intact.     Conjunctiva/sclera: Conjunctivae normal.     Pupils: Pupils are equal, round, and reactive to  light.  Cardiovascular:     Rate and Rhythm: Normal rate and regular rhythm.     Pulses: Normal pulses.     Heart sounds: Normal heart sounds. No murmur heard. No friction rub. No gallop.   Pulmonary:     Effort: Pulmonary effort is normal. No respiratory distress.     Breath sounds: Normal breath sounds. No stridor. No wheezing, rhonchi or rales.  Chest:     Chest wall: No tenderness.  Musculoskeletal:        General: Normal range of motion.     Cervical back: Normal range of motion and neck supple.  Skin:    General: Skin is warm and dry.     Capillary Refill: Capillary refill takes less than 2 seconds.     Coloration: Skin is not jaundiced or pale.     Findings: No bruising, erythema, lesion or rash.  Neurological:     General: No focal deficit present.     Mental Status: She is alert and oriented to person, place, and time. Mental status is at baseline.  Psychiatric:        Mood and Affect: Mood normal.        Behavior: Behavior normal.        Thought Content: Thought content normal.        Judgment: Judgment normal.     Results for orders placed or performed in visit on 11/12/20  Comprehensive metabolic panel  Result Value Ref Range   Glucose 144 (H) 65 - 99 mg/dL   BUN 20 8 - 27 mg/dL   Creatinine, Ser 7.51 0.57 - 1.00 mg/dL   GFR calc non Af Amer 99 >59 mL/min/1.73   GFR calc Af Amer 114 >59 mL/min/1.73   BUN/Creatinine Ratio 34 (H) 12 - 28   Sodium 143 134 - 144 mmol/L   Potassium 4.7 3.5 - 5.2 mmol/L   Chloride 101 96 - 106 mmol/L   CO2 24 20 - 29 mmol/L   Calcium 9.8 8.7 - 10.3 mg/dL   Total Protein 7.2 6.0 - 8.5 g/dL   Albumin 5.0 (H) 3.8 - 4.8 g/dL   Globulin, Total 2.2 1.5 - 4.5 g/dL   Albumin/Globulin Ratio 2.3 (H) 1.2 - 2.2   Bilirubin Total 0.3 0.0 - 1.2 mg/dL   Alkaline Phosphatase 75 44 - 121 IU/L   AST 18 0 - 40 IU/L   ALT 20 0 - 32 IU/L  Bayer DCA Hb A1c Waived  Result Value Ref Range   HB A1C (BAYER DCA - WAIVED) 7.2 (H) <7.0 %  Lipid Panel  w/o Chol/HDL Ratio  Result Value Ref Range   Cholesterol, Total 153 100 - 199 mg/dL   Triglycerides 025 0 - 149 mg/dL   HDL 41 >85 mg/dL   VLDL Cholesterol Cal 22 5 - 40 mg/dL   LDL Chol Calc (NIH) 90 0 - 99 mg/dL  Assessment & Plan:   Problem List Items Addressed This Visit      Endocrine   DM (diabetes mellitus), type 2 (HCC) - Primary    Doing better with A1c of 7.2 down from 7.8- continue diet and exercise. Continue to monitor. Recheck 2 months. Call with any concerns.       Relevant Medications   simvastatin (ZOCOR) 40 MG tablet   empagliflozin (JARDIANCE) 25 MG TABS tablet   Other Relevant Orders   Comprehensive metabolic panel (Completed)   Bayer DCA Hb A1c Waived (Completed)     Genitourinary   Benign hypertensive renal disease    Under good control on current regimen. Continue current regimen. Continue to monitor. Call with any concerns. Refills given. Labs drawn today.        Relevant Orders   Comprehensive metabolic panel (Completed)     Other   Hyperlipidemia    Under good control on current regimen. Continue current regimen. Continue to monitor. Call with any concerns. Refills given. Labs drawn today.       Relevant Medications   simvastatin (ZOCOR) 40 MG tablet   EPINEPHrine 0.3 mg/0.3 mL IJ SOAJ injection   Other Relevant Orders   Comprehensive metabolic panel (Completed)   Lipid Panel w/o Chol/HDL Ratio (Completed)   Anxiety    Under good control on current regimen. Continue current regimen. Continue to monitor. Call with any concerns. Refills given. Labs drawn today.       Relevant Medications   escitalopram (LEXAPRO) 20 MG tablet       Follow up plan: Return in about 3 months (around 02/10/2021).

## 2020-11-13 LAB — COMPREHENSIVE METABOLIC PANEL
ALT: 20 IU/L (ref 0–32)
AST: 18 IU/L (ref 0–40)
Albumin/Globulin Ratio: 2.3 — ABNORMAL HIGH (ref 1.2–2.2)
Albumin: 5 g/dL — ABNORMAL HIGH (ref 3.8–4.8)
Alkaline Phosphatase: 75 IU/L (ref 44–121)
BUN/Creatinine Ratio: 34 — ABNORMAL HIGH (ref 12–28)
BUN: 20 mg/dL (ref 8–27)
Bilirubin Total: 0.3 mg/dL (ref 0.0–1.2)
CO2: 24 mmol/L (ref 20–29)
Calcium: 9.8 mg/dL (ref 8.7–10.3)
Chloride: 101 mmol/L (ref 96–106)
Creatinine, Ser: 0.58 mg/dL (ref 0.57–1.00)
GFR calc Af Amer: 114 mL/min/{1.73_m2} (ref >59)
GFR calc non Af Amer: 99 mL/min/{1.73_m2} (ref >59)
Globulin, Total: 2.2 g/dL (ref 1.5–4.5)
Glucose: 144 mg/dL — ABNORMAL HIGH (ref 65–99)
Potassium: 4.7 mmol/L (ref 3.5–5.2)
Sodium: 143 mmol/L (ref 134–144)
Total Protein: 7.2 g/dL (ref 6.0–8.5)

## 2020-11-13 LAB — LIPID PANEL W/O CHOL/HDL RATIO
Cholesterol, Total: 153 mg/dL (ref 100–199)
HDL: 41 mg/dL (ref >39)
LDL Chol Calc (NIH): 90 mg/dL (ref 0–99)
Triglycerides: 122 mg/dL (ref 0–149)
VLDL Cholesterol Cal: 22 mg/dL (ref 5–40)

## 2020-11-15 ENCOUNTER — Encounter: Payer: Self-pay | Admitting: Family Medicine

## 2020-11-15 NOTE — Assessment & Plan Note (Signed)
Doing better with A1c of 7.2 down from 7.8- continue diet and exercise. Continue to monitor. Recheck 2 months. Call with any concerns.

## 2020-11-15 NOTE — Assessment & Plan Note (Signed)
Under good control on current regimen. Continue current regimen. Continue to monitor. Call with any concerns. Refills given. Labs drawn today.   

## 2021-01-04 DIAGNOSIS — H2513 Age-related nuclear cataract, bilateral: Secondary | ICD-10-CM | POA: Diagnosis not present

## 2021-01-04 DIAGNOSIS — E1339 Other specified diabetes mellitus with other diabetic ophthalmic complication: Secondary | ICD-10-CM | POA: Diagnosis not present

## 2021-01-04 DIAGNOSIS — H353131 Nonexudative age-related macular degeneration, bilateral, early dry stage: Secondary | ICD-10-CM | POA: Diagnosis not present

## 2021-01-04 DIAGNOSIS — H35369 Drusen (degenerative) of macula, unspecified eye: Secondary | ICD-10-CM | POA: Diagnosis not present

## 2021-01-04 LAB — HM DIABETES EYE EXAM

## 2021-01-13 ENCOUNTER — Other Ambulatory Visit: Payer: Self-pay | Admitting: Obstetrics & Gynecology

## 2021-01-13 DIAGNOSIS — Z1211 Encounter for screening for malignant neoplasm of colon: Secondary | ICD-10-CM | POA: Diagnosis not present

## 2021-01-13 DIAGNOSIS — Z1231 Encounter for screening mammogram for malignant neoplasm of breast: Secondary | ICD-10-CM

## 2021-01-13 DIAGNOSIS — Z01419 Encounter for gynecological examination (general) (routine) without abnormal findings: Secondary | ICD-10-CM | POA: Diagnosis not present

## 2021-01-13 DIAGNOSIS — Z1151 Encounter for screening for human papillomavirus (HPV): Secondary | ICD-10-CM | POA: Diagnosis not present

## 2021-01-13 LAB — HM PAP SMEAR

## 2021-02-03 ENCOUNTER — Ambulatory Visit
Admission: RE | Admit: 2021-02-03 | Discharge: 2021-02-03 | Disposition: A | Discharge status: Home / Self Care | Payer: BC Managed Care – PPO | Source: Ambulatory Visit | Attending: Obstetrics & Gynecology | Admitting: Obstetrics & Gynecology

## 2021-02-03 ENCOUNTER — Other Ambulatory Visit: Payer: Self-pay

## 2021-02-03 DIAGNOSIS — Z1231 Encounter for screening mammogram for malignant neoplasm of breast: Secondary | ICD-10-CM

## 2021-02-11 ENCOUNTER — Encounter: Payer: Self-pay | Admitting: Family Medicine

## 2021-02-11 ENCOUNTER — Ambulatory Visit: Payer: BC Managed Care – PPO | Admitting: Family Medicine

## 2021-02-11 ENCOUNTER — Other Ambulatory Visit: Payer: Self-pay

## 2021-02-11 VITALS — BP 111/69 | HR 65 | Temp 98.2°F | Ht 61.75 in | Wt 129.2 lb

## 2021-02-11 DIAGNOSIS — E1129 Type 2 diabetes mellitus with other diabetic kidney complication: Secondary | ICD-10-CM

## 2021-02-11 DIAGNOSIS — E782 Mixed hyperlipidemia: Secondary | ICD-10-CM

## 2021-02-11 DIAGNOSIS — I129 Hypertensive chronic kidney disease with stage 1 through stage 4 chronic kidney disease, or unspecified chronic kidney disease: Secondary | ICD-10-CM

## 2021-02-11 DIAGNOSIS — Z0000 Encounter for general adult medical examination without abnormal findings: Secondary | ICD-10-CM | POA: Diagnosis not present

## 2021-02-11 DIAGNOSIS — R809 Proteinuria, unspecified: Secondary | ICD-10-CM

## 2021-02-11 LAB — URINALYSIS, ROUTINE W REFLEX MICROSCOPIC
Bilirubin, UA: NEGATIVE
Ketones, UA: NEGATIVE
Leukocytes,UA: NEGATIVE
Nitrite, UA: NEGATIVE
Protein,UA: NEGATIVE
RBC, UA: NEGATIVE
Specific Gravity, UA: 1.015 (ref 1.005–1.030)
Urobilinogen, Ur: 0.2 mg/dL (ref 0.2–1.0)
pH, UA: 5 (ref 5.0–7.5)

## 2021-02-11 LAB — MICROALBUMIN, URINE WAIVED
Creatinine, Urine Waived: 100 mg/dL (ref 10–300)
Microalb, Ur Waived: 10 mg/L (ref 0–19)
Microalb/Creat Ratio: <30 mg/g (ref <30)

## 2021-02-11 LAB — BAYER DCA HB A1C WAIVED: HB A1C (BAYER DCA - WAIVED): 7.6 % — ABNORMAL HIGH (ref <7.0)

## 2021-02-11 MED ORDER — TRULICITY 3 MG/0.5ML ~~LOC~~ SOAJ: 3.0000 mg | SUBCUTANEOUS | 1 refills | Status: DC

## 2021-02-11 MED ORDER — OMEPRAZOLE 20 MG PO CPDR: 20.0000 mg | DELAYED_RELEASE_CAPSULE | Freq: Every day | ORAL | 3 refills | Status: DC

## 2021-02-11 MED ORDER — LORAZEPAM 0.5 MG PO TABS: 0.5000 mg | ORAL_TABLET | Freq: Two times a day (BID) | ORAL | 1 refills | Status: DC | PRN

## 2021-02-11 NOTE — Patient Instructions (Addendum)
Health Maintenance, Female Adopting a healthy lifestyle and getting preventive care are important in promoting health and wellness. Ask your health care provider about:  The right schedule for you to have regular tests and exams.  Things you can do on your own to prevent diseases and keep yourself healthy. What should I know about diet, weight, and exercise? Eat a healthy diet  Eat a diet that includes plenty of vegetables, fruits, low-fat dairy products, and lean protein.  Do not eat a lot of foods that are high in solid fats, added sugars, or sodium.   Maintain a healthy weight Body mass index (BMI) is used to identify weight problems. It estimates body fat based on height and weight. Your health care provider can help determine your BMI and help you achieve or maintain a healthy weight. Get regular exercise Get regular exercise. This is one of the most important things you can do for your health. Most adults should:  Exercise for at least 150 minutes each week. The exercise should increase your heart rate and make you sweat (moderate-intensity exercise).  Do strengthening exercises at least twice a week. This is in addition to the moderate-intensity exercise.  Spend less time sitting. Even light physical activity can be beneficial. Watch cholesterol and blood lipids Have your blood tested for lipids and cholesterol at 63 years of age, then have this test every 5 years. Have your cholesterol levels checked more often if:  Your lipid or cholesterol levels are high.  You are older than 63 years of age.  You are at high risk for heart disease. What should I know about cancer screening? Depending on your health history and family history, you may need to have cancer screening at various ages. This may include screening for:  Breast cancer.  Cervical cancer.  Colorectal cancer.  Skin cancer.  Lung cancer. What should I know about heart disease, diabetes, and high blood  pressure? Blood pressure and heart disease  High blood pressure causes heart disease and increases the risk of stroke. This is more likely to develop in people who have high blood pressure readings, are of African descent, or are overweight.  Have your blood pressure checked: ? Every 3-5 years if you are 18-39 years of age. ? Every year if you are 40 years old or older. Diabetes Have regular diabetes screenings. This checks your fasting blood sugar level. Have the screening done:  Once every three years after age 40 if you are at a normal weight and have a low risk for diabetes.  More often and at a younger age if you are overweight or have a high risk for diabetes. What should I know about preventing infection? Hepatitis B If you have a higher risk for hepatitis B, you should be screened for this virus. Talk with your health care provider to find out if you are at risk for hepatitis B infection. Hepatitis C Testing is recommended for:  Everyone born from 1945 through 1965.  Anyone with known risk factors for hepatitis C. Sexually transmitted infections (STIs)  Get screened for STIs, including gonorrhea and chlamydia, if: ? You are sexually active and are younger than 63 years of age. ? You are older than 63 years of age and your health care provider tells you that you are at risk for this type of infection. ? Your sexual activity has changed since you were last screened, and you are at increased risk for chlamydia or gonorrhea. Ask your health care provider   if you are at risk.  Ask your health care provider about whether you are at high risk for HIV. Your health care provider may recommend a prescription medicine to help prevent HIV infection. If you choose to take medicine to prevent HIV, you should first get tested for HIV. You should then be tested every 3 months for as long as you are taking the medicine. Pregnancy  If you are about to stop having your period (premenopausal) and  you may become pregnant, seek counseling before you get pregnant.  Take 400 to 800 micrograms (mcg) of folic acid every day if you become pregnant.  Ask for birth control (contraception) if you want to prevent pregnancy. Osteoporosis and menopause Osteoporosis is a disease in which the bones lose minerals and strength with aging. This can result in bone fractures. If you are 65 years old or older, or if you are at risk for osteoporosis and fractures, ask your health care provider if you should:  Be screened for bone loss.  Take a calcium or vitamin D supplement to lower your risk of fractures.  Be given hormone replacement therapy (HRT) to treat symptoms of menopause. Follow these instructions at home: Lifestyle  Do not use any products that contain nicotine or tobacco, such as cigarettes, e-cigarettes, and chewing tobacco. If you need help quitting, ask your health care provider.  Do not use street drugs.  Do not share needles.  Ask your health care provider for help if you need support or information about quitting drugs. Alcohol use  Do not drink alcohol if: ? Your health care provider tells you not to drink. ? You are pregnant, may be pregnant, or are planning to become pregnant.  If you drink alcohol: ? Limit how much you use to 0-1 drink a day. ? Limit intake if you are breastfeeding.  Be aware of how much alcohol is in your drink. In the [place] one drink equals one 12 oz bottle of beer (355 mL), one 5 oz glass of wine (148 mL), or one 1 oz glass of hard liquor (44 mL). General instructions  Schedule regular health, dental, and eye exams.  Stay current with your vaccines.  Tell your health care provider if: ? You often feel depressed. ? You have ever been abused or do not feel safe at home. Summary  Adopting a healthy lifestyle and getting preventive care are important in promoting health and wellness.  Follow your health care provider's instructions about healthy  diet, exercising, and getting tested or screened for diseases.  Follow your health care provider's instructions on monitoring your cholesterol and blood pressure. This information is not intended to replace advice given to you by your health care provider. Make sure you discuss any questions you have with your health care provider. Document Revised: 10/16/2018 Document Reviewed: 10/16/2018 [employer] Patient Education  2021 [employer]  

## 2021-02-11 NOTE — Progress Notes (Signed)
BP 111/69   Pulse 65   Temp 98.2 F (36.8 C)   Ht 5' 1.75" (1.568 m)   Wt 129 lb 3.2 oz (58.6 kg)   SpO2 97%   BMI 23.82 kg/m    Subjective:    Patient ID: Brianna Leon, female    DOB: Feb 15, 1956, 65 y.o.   MRN: [id number]  HPI: Brianna Leon is a 65 y.o. female presenting on 02/11/2021 for comprehensive medical examination. Current medical complaints include:  GERD GERD control status: uncontrolled  Satisfied with current treatment? no Heartburn frequency: occasionally Medication side effects: no  Medication compliance: not on anything Dysphagia: no Odynophagia:  no Hematemesis: no Blood in stool: no EGD: no  ANXIETY/STRESS Duration: chronic Status:stable Anxious mood: yes  Excessive worrying: no Irritability: no  Sweating: no Nausea: no Palpitations:no Hyperventilation: no Panic attacks: no Agoraphobia: no  Obscessions/compulsions: no Depressed mood: no Depression screen [facility] 2/9 02/11/2021 04/14/2020 12/05/2019 09/12/2019 06/25/2019  Decreased Interest 0 0 0 2 0  Down, Depressed, Hopeless 0 2 0 0 1  PHQ - 2 Score 0 2 0 2 1  Altered sleeping - 2 0 1 3  Tired, decreased energy - 2 0 1 3  Change in appetite - 1 0 0 2  Feeling bad or failure about yourself  - 0 0 0 0  Trouble concentrating - 0 0 0 0  Moving slowly or fidgety/restless - 0 0 0 3  Suicidal thoughts - 0 0 0 0  PHQ-9 Score - 7 0 4 12  Difficult doing work/chores - - Not difficult at all Somewhat difficult Very difficult  Some recent data might be hidden  Anhedonia: no Weight changes: no Insomnia: no   Hypersomnia: no Fatigue/loss of energy: yes Feelings of worthlessness: no Feelings of guilt: no Impaired concentration/indecisiveness: no Suicidal ideations: no  Crying spells: no Recent Stressors/Life Changes: no   Relationship problems: no   Family stress: no     Financial stress: no    Job stress: no    Recent death/loss: no  DIABETES Hypoglycemic episodes:no Polydipsia/polyuria:  no Visual disturbance: no Chest pain: no Paresthesias: no Glucose Monitoring: no  Accucheck frequency: Not Checking Taking Insulin?: no Blood Pressure Monitoring: not checking Retinal Examination: Up to Date Foot Exam: Up to Date Diabetic Education: Completed Pneumovax: Up to Date Influenza: Up to Date Aspirin: no  HYPERTENSION / HYPERLIPIDEMIA Satisfied with current treatment? yes Duration of hypertension: chronic BP monitoring frequency: not checking Duration of hyperlipidemia: chronic Cholesterol medication side effects: no Cholesterol supplements: none Past cholesterol medications: simvasatatin Medication compliance: excellent compliance Aspirin: yes Recent stressors: no Recurrent headaches: no Visual changes: no Palpitations: no Dyspnea: no Chest pain: no Lower extremity edema: no Dizzy/lightheaded: no  Menopausal Symptoms: no  Depression Screen done today and results listed below:  Depression screen [facility] 2/9 02/11/2021 04/14/2020 12/05/2019 09/12/2019 06/25/2019  Decreased Interest 0 0 0 2 0  Down, Depressed, Hopeless 0 2 0 0 1  PHQ - 2 Score 0 2 0 2 1  Altered sleeping - 2 0 1 3  Tired, decreased energy - 2 0 1 3  Change in appetite - 1 0 0 2  Feeling bad or failure about yourself  - 0 0 0 0  Trouble concentrating - 0 0 0 0  Moving slowly or fidgety/restless - 0 0 0 3  Suicidal thoughts - 0 0 0 0  PHQ-9 Score - 7 0 4 12  Difficult doing work/chores - - Not difficult at  all Somewhat difficult Very difficult  Some recent data might be hidden    Past Medical History:  Past Medical History:  Diagnosis Date  . Arthritis    KNEES  . Biliary colic 1/96/2229  . CAD (coronary artery disease)   . DM (diabetes mellitus), type 2 ([place]) 2000   insulin started 2016  . Elevated liver enzymes   . Family history of colonic polyps    father  . GERD (gastroesophageal reflux disease)   . Hemorrhage    DURING PREGNANCY 31 YEARS AGO  . Hyperlipidemia   . IBS (irritable  bowel syndrome)   . Migraine   . RUQ pain 06/12/2017    Surgical History:  Past Surgical History:  Procedure Laterality Date  . ABDOMINAL HYSTERECTOMY  22 years ago  . CARDIAC CATHETERIZATION     x 2  . CARPAL TUNNEL RELEASE Right   . CHOLECYSTECTOMY N/A 09/20/2017   Procedure: LAPAROSCOPIC CHOLECYSTECTOMY WITH INTRAOPERATIVE CHOLANGIOGRAM;  Surgeon: Robert Bellow, MD;  Location: [facility] ORS;  Service: General;  Laterality: N/A;  . COLONOSCOPY  05/18/2015   repeat 5 years  . COLONOSCOPY WITH PROPOFOL N/A 07/01/2020   Procedure: COLONOSCOPY WITH PROPOFOL;  Surgeon: [name], MD;  Location: [facility] ENDOSCOPY;  Service: Gastroenterology;  Laterality: N/A;  . ESOPHAGOGASTRODUODENOSCOPY  05/18/2015  . KNEE ARTHROSCOPY  2001 and 2009  . TONSILLECTOMY      Medications:  Current Outpatient Medications on File Prior to Visit  Medication Sig  . Ascorbic Acid (CHEWABLE VITAMIN C PO) Take by mouth daily.  Marland Kitchen aspirin EC 81 MG tablet Take 81 mg by mouth daily.  . blood glucose meter kit and supplies KIT Dispense based on patient and insurance preference. Use up to four times daily as directed. (FOR ICD-9 250.00, 250.01).  Marland Kitchen empagliflozin (JARDIANCE) 25 MG TABS tablet Take 1 tablet (25 mg total) by mouth daily.  Marland Kitchen EPINEPHrine 0.3 mg/0.3 mL IJ SOAJ injection INJECT INTRAMUSCULARLY AS DIRECTED  . escitalopram (LEXAPRO) 20 MG tablet Take 1 tablet (20 mg total) by mouth daily.  . fexofenadine (ALLEGRA) 180 MG tablet Take 180 mg by mouth daily.  . Multiple Vitamins-Minerals (OCUVITE ADULT 50+ PO) Take 1 capsule by mouth daily.  . Multiple Vitamins-Minerals (ZINC PO) Take by mouth daily.  . simvastatin (ZOCOR) 40 MG tablet TAKE 1 TABLET(40 MG) BY MOUTH AT BEDTIME  . UNABLE TO FIND Take by mouth daily. Vitamin D3, vitamin B, Bioflex  . UNABLE TO FIND Take by mouth daily. Ocutive eye vitamins, cinnamon vitamins   No current facility-administered medications on file prior to visit.    Allergies:   Allergies  Allergen Reactions  . Carafate [Sucralfate] Anaphylaxis and Hives  . Atorvastatin Other (See Comments)    Unknown  . Metformin And Related Diarrhea  . Other Hives and Other (See Comments)    Lysol Cleaning Products  . Penicillins Nausea And Vomiting  . Tramadol Nausea Only    Social History:  Social History   Socioeconomic History  . Marital status: Married    Spouse name: Not on file  . Number of children: Not on file  . Years of education: Not on file  . Highest education level: Not on file  Occupational History  . Not on file  Tobacco Use  . Smoking status: Never Smoker  . Smokeless tobacco: Never Used  Vaping Use  . Vaping Use: Never used  Substance and Sexual Activity  . Alcohol use: No  . Drug use: No  . Sexual  activity: Yes  Other Topics Concern  . Not on file  Social History Narrative  . Not on file   Social Determinants of Health   Financial Resource Strain: Not on file  Food Insecurity: Not on file  Transportation Needs: Not on file  Physical Activity: Not on file  Stress: Not on file  Social Connections: Not on file  Intimate Partner Violence: Not on file   Social History   Tobacco Use  Smoking Status Never Smoker  Smokeless Tobacco Never Used   Social History   Substance and Sexual Activity  Alcohol Use No    Family History:  Family History  Problem Relation Age of Onset  . Colon polyps Father   . Diabetes Father   . Hypertension Father   . Heart disease Father   . Stroke Father   . Diabetes Mother   . Hypertension Mother   . Diabetes Brother   . Heart failure Brother        pacemaker  . Hypertension Brother   . Heart attack Brother   . Migraines Daughter   . Lupus Son   . Diabetes Maternal Grandmother   . Heart disease Maternal Grandmother   . Stroke Maternal Grandfather   . Hypertension Paternal Grandmother   . Stroke Paternal Grandfather   . Hyperlipidemia Brother   . Breast cancer Neg Hx     Past medical  history, surgical history, medications, allergies, family history and social history reviewed with patient today and changes made to appropriate areas of the chart.   Review of Systems  Constitutional: Negative.   HENT: Negative.   Eyes: Negative.   Respiratory: Negative.   Cardiovascular: Negative.   Gastrointestinal: Positive for heartburn, nausea and vomiting. Negative for abdominal pain, blood in stool, constipation, diarrhea and melena.  Genitourinary: Negative.   Musculoskeletal: Negative.   Skin: Negative.   Neurological: Negative.   Endo/Heme/Allergies: Negative.   Psychiatric/Behavioral: Negative.    All other ROS negative except what is listed above and in the HPI.      Objective:    BP 111/69   Pulse 65   Temp 98.2 F (36.8 C)   Ht 5' 1.75" (1.568 m)   Wt 129 lb 3.2 oz (58.6 kg)   SpO2 97%   BMI 23.82 kg/m   Wt Readings from Last 3 Encounters:  02/11/21 129 lb 3.2 oz (58.6 kg)  11/12/20 126 lb 12.8 oz (57.5 kg)  07/30/20 129 lb (58.5 kg)    Physical Exam Vitals and nursing note reviewed.  Constitutional:      General: She is not in acute distress.    Appearance: Normal appearance. She is not ill-appearing, toxic-appearing or diaphoretic.  HENT:     Head: Normocephalic and atraumatic.     Right Ear: Tympanic membrane, ear canal and external ear normal. There is no impacted cerumen.     Left Ear: Tympanic membrane, ear canal and external ear normal. There is no impacted cerumen.     Nose: Nose normal. No congestion or rhinorrhea.     Mouth/Throat:     Mouth: Mucous membranes are moist.     Pharynx: Oropharynx is clear. No oropharyngeal exudate or posterior oropharyngeal erythema.  Eyes:     General: No scleral icterus.       Right eye: No discharge.        Left eye: No discharge.     Extraocular Movements: Extraocular movements intact.     Conjunctiva/sclera: Conjunctivae normal.  Pupils: Pupils are equal, round, and reactive to light.  Neck:      Vascular: No carotid bruit.  Cardiovascular:     Rate and Rhythm: Normal rate and regular rhythm.     Pulses: Normal pulses.     Heart sounds: No murmur heard. No friction rub. No gallop.   Pulmonary:     Effort: Pulmonary effort is normal. No respiratory distress.     Breath sounds: Normal breath sounds. No stridor. No wheezing, rhonchi or rales.  Chest:     Chest wall: No tenderness.  Abdominal:     General: Abdomen is flat. Bowel sounds are normal. There is no distension.     Palpations: Abdomen is soft. There is no mass.     Tenderness: There is no abdominal tenderness. There is no right CVA tenderness, left CVA tenderness, guarding or rebound.     Hernia: No hernia is present.  Genitourinary:    Comments: Breast and pelvic exams deferred with shared decision making Musculoskeletal:        General: No swelling, tenderness, deformity or signs of injury.     Cervical back: Normal range of motion and neck supple. No rigidity. No muscular tenderness.     Right lower leg: No edema.     Left lower leg: No edema.  Lymphadenopathy:     Cervical: No cervical adenopathy.  Skin:    General: Skin is warm and dry.     Capillary Refill: Capillary refill takes less than 2 seconds.     Coloration: Skin is not jaundiced or pale.     Findings: No bruising, erythema, lesion or rash.  Neurological:     General: No focal deficit present.     Mental Status: She is alert and oriented to person, place, and time. Mental status is at baseline.     Cranial Nerves: No cranial nerve deficit.     Sensory: No sensory deficit.     Motor: No weakness.     Coordination: Coordination normal.     Gait: Gait normal.     Deep Tendon Reflexes: Reflexes normal.  Psychiatric:        Mood and Affect: Mood normal.        Behavior: Behavior normal.        Thought Content: Thought content normal.        Judgment: Judgment normal.     Results for orders placed or performed in visit on 02/11/21  CBC with  Differential/Platelet  Result Value Ref Range   WBC 8.8 3.4 - 10.8 x10E3/uL   RBC 4.32 3.77 - 5.28 x10E6/uL   Hemoglobin 14.1 11.1 - 15.9 g/dL   Hematocrit 40.9 34.0 - 46.6 %   MCV 95 79 - 97 fL   MCH 32.6 26.6 - 33.0 pg   MCHC 34.5 31.5 - 35.7 g/dL   RDW 11.0 (L) 11.7 - 15.4 %   Platelets 257 150 - 450 x10E3/uL   Neutrophils 63 Not Estab. %   Lymphs 27 Not Estab. %   Monocytes 7 Not Estab. %   Eos 2 Not Estab. %   Basos 1 Not Estab. %   Neutrophils Absolute 5.5 1.4 - 7.0 x10E3/uL   Lymphocytes Absolute 2.4 0.7 - 3.1 x10E3/uL   Monocytes Absolute 0.6 0.1 - 0.9 x10E3/uL   EOS (ABSOLUTE) 0.2 0.0 - 0.4 x10E3/uL   Basophils Absolute 0.1 0.0 - 0.2 x10E3/uL   Immature Granulocytes 0 Not Estab. %   Immature Grans (Abs) 0.0 0.0 -  0.1 x10E3/uL  Comprehensive metabolic panel  Result Value Ref Range   Glucose 191 (H) 65 - 99 mg/dL   BUN 14 8 - 27 mg/dL   Creatinine, Ser 0.59 0.57 - 1.00 mg/dL   eGFR 101 >59 mL/min/1.73   BUN/Creatinine Ratio 24 12 - 28   Sodium 141 134 - 144 mmol/L   Potassium 4.8 3.5 - 5.2 mmol/L   Chloride 100 96 - 106 mmol/L   CO2 25 20 - 29 mmol/L   Calcium 9.8 8.7 - 10.3 mg/dL   Total Protein 7.5 6.0 - 8.5 g/dL   Albumin 5.0 (H) 3.8 - 4.8 g/dL   Globulin, Total 2.5 1.5 - 4.5 g/dL   Albumin/Globulin Ratio 2.0 1.2 - 2.2   Bilirubin Total 0.4 0.0 - 1.2 mg/dL   Alkaline Phosphatase 84 44 - 121 IU/L   AST 17 0 - 40 IU/L   ALT 19 0 - 32 IU/L  Lipid Panel w/o Chol/HDL Ratio  Result Value Ref Range   Cholesterol, Total 149 100 - 199 mg/dL   Triglycerides 118 0 - 149 mg/dL   HDL 42 >39 mg/dL   VLDL Cholesterol Cal 21 5 - 40 mg/dL   LDL Chol Calc (NIH) 86 0 - 99 mg/dL  Urinalysis, Routine w reflex microscopic  Result Value Ref Range   Specific Gravity, UA 1.015 1.005 - 1.030   pH, UA 5.0 5.0 - 7.5   Color, UA Yellow Yellow   Appearance Ur Clear Clear   Leukocytes,UA Negative Negative   Protein,UA Negative Negative/Trace   Glucose, UA 3+ (A) Negative    Ketones, UA Negative Negative   RBC, UA Negative Negative   Bilirubin, UA Negative Negative   Urobilinogen, Ur 0.2 0.2 - 1.0 mg/dL   Nitrite, UA Negative Negative  TSH  Result Value Ref Range   TSH 0.766 0.450 - 4.500 uIU/mL  Bayer DCA Hb A1c Waived  Result Value Ref Range   HB A1C (BAYER DCA - WAIVED) 7.6 (H) <7.0 %  Microalbumin, Urine Waived  Result Value Ref Range   Microalb, Ur Waived 10 0 - 19 mg/L   Creatinine, Urine Waived 100 10 - 300 mg/dL   Microalb/Creat Ratio <30 <30 mg/g      Assessment & Plan:   Problem List Items Addressed This Visit      Endocrine   DM (diabetes mellitus), type 2 (HCC)    A1c up slightly at 7.6- continue current regimen. Will increase her trulicity to 55m. Call with any concerns. Continue to monitor.       Relevant Medications   Dulaglutide (TRULICITY) 3 MWL/8.9HTSOPN   Other Relevant Orders   CBC with Differential/Platelet (Completed)   Comprehensive metabolic panel (Completed)   Urinalysis, Routine w reflex microscopic (Completed)   Bayer DCA Hb A1c Waived (Completed)   Microalbumin, Urine Waived (Completed)     Genitourinary   Benign hypertensive renal disease    Under good control on current regimen. Continue current regimen. Continue to monitor. Call with any concerns. Refills given. Labs drawn today.        Relevant Orders   CBC with Differential/Platelet (Completed)   Comprehensive metabolic panel (Completed)   TSH (Completed)   Microalbumin, Urine Waived (Completed)     Other   Hyperlipidemia    Under good control on current regimen. Continue current regimen. Continue to monitor. Call with any concerns. Refills given. Labs drawn today.       Relevant Orders   CBC with Differential/Platelet (Completed)  Comprehensive metabolic panel (Completed)   Lipid Panel w/o Chol/HDL Ratio (Completed)    Other Visit Diagnoses    Routine general medical examination at a health care facility    -  Primary   Vaccines up to date.  Screening labs checked today. Pap UTD. Mammogram and Colonoscopy up to date. Continue diet and exercise. Call with concerns.   Relevant Orders   CBC with Differential/Platelet (Completed)   Comprehensive metabolic panel (Completed)   Lipid Panel w/o Chol/HDL Ratio (Completed)   Urinalysis, Routine w reflex microscopic (Completed)   TSH (Completed)   Bayer DCA Hb A1c Waived (Completed)   Microalbumin, Urine Waived (Completed)       Follow up plan: Return in about 3 months (around 05/13/2021).   LABORATORY TESTING:  - Pap smear: up to date  IMMUNIZATIONS:   - Tdap: Tetanus vaccination status reviewed: last tetanus booster within 10 years. - Influenza: Up to date - Pneumovax: Up to date - Prevnar: Not applicable - HPV: Up to date   SCREENING: -Mammogram: Up to date  - Colonoscopy: Up to date   PATIENT COUNSELING:   Advised to take 1 mg of folate supplement per day if capable of pregnancy.   Sexuality: Discussed sexually transmitted diseases, partner selection, use of condoms, avoidance of unintended pregnancy  and contraceptive alternatives.   Advised to avoid cigarette smoking.  I discussed with the patient that most people either abstain from alcohol or drink within safe limits (<=14/week and <=4 drinks/occasion for males, <=7/weeks and <= 3 drinks/occasion for females) and that the risk for alcohol disorders and other health effects rises proportionally with the number of drinks per week and how often a drinker exceeds daily limits.  Discussed cessation/primary prevention of drug use and availability of treatment for abuse.   Diet: Encouraged to adjust caloric intake to maintain  or achieve ideal body weight, to reduce intake of dietary saturated fat and total fat, to limit sodium intake by avoiding high sodium foods and not adding table salt, and to maintain adequate dietary potassium and calcium preferably from fresh fruits, vegetables, and low-fat dairy products.     stressed the importance of regular exercise  Injury prevention: Discussed safety belts, safety helmets, smoke detector, smoking near bedding or upholstery.   Dental health: Discussed importance of regular tooth brushing, flossing, and dental visits.    NEXT PREVENTATIVE PHYSICAL DUE IN 1 YEAR. Return in about 3 months (around 05/13/2021).

## 2021-02-12 LAB — CBC WITH DIFFERENTIAL/PLATELET
Basophils Absolute: 0.1 10*3/uL (ref 0.0–0.2)
Basos: 1 %
EOS (ABSOLUTE): 0.2 10*3/uL (ref 0.0–0.4)
Eos: 2 %
Hematocrit: 40.9 % (ref 34.0–46.6)
Hemoglobin: 14.1 g/dL (ref 11.1–15.9)
Immature Grans (Abs): 0 10*3/uL (ref 0.0–0.1)
Immature Granulocytes: 0 %
Lymphocytes Absolute: 2.4 10*3/uL (ref 0.7–3.1)
Lymphs: 27 %
MCH: 32.6 pg (ref 26.6–33.0)
MCHC: 34.5 g/dL (ref 31.5–35.7)
MCV: 95 fL (ref 79–97)
Monocytes Absolute: 0.6 10*3/uL (ref 0.1–0.9)
Monocytes: 7 %
Neutrophils Absolute: 5.5 10*3/uL (ref 1.4–7.0)
Neutrophils: 63 %
Platelets: 257 10*3/uL (ref 150–450)
RBC: 4.32 x10E6/uL (ref 3.77–5.28)
RDW: 11 % — ABNORMAL LOW (ref 11.7–15.4)
WBC: 8.8 10*3/uL (ref 3.4–10.8)

## 2021-02-12 LAB — COMPREHENSIVE METABOLIC PANEL
ALT: 19 IU/L (ref 0–32)
AST: 17 IU/L (ref 0–40)
Albumin/Globulin Ratio: 2 (ref 1.2–2.2)
Albumin: 5 g/dL — ABNORMAL HIGH (ref 3.8–4.8)
Alkaline Phosphatase: 84 IU/L (ref 44–121)
BUN/Creatinine Ratio: 24 (ref 12–28)
BUN: 14 mg/dL (ref 8–27)
Bilirubin Total: 0.4 mg/dL (ref 0.0–1.2)
CO2: 25 mmol/L (ref 20–29)
Calcium: 9.8 mg/dL (ref 8.7–10.3)
Chloride: 100 mmol/L (ref 96–106)
Creatinine, Ser: 0.59 mg/dL (ref 0.57–1.00)
Globulin, Total: 2.5 g/dL (ref 1.5–4.5)
Glucose: 191 mg/dL — ABNORMAL HIGH (ref 65–99)
Potassium: 4.8 mmol/L (ref 3.5–5.2)
Sodium: 141 mmol/L (ref 134–144)
Total Protein: 7.5 g/dL (ref 6.0–8.5)
eGFR: 101 mL/min/{1.73_m2} (ref >59)

## 2021-02-12 LAB — TSH: TSH: 0.766 u[IU]/mL (ref 0.450–4.500)

## 2021-02-12 LAB — LIPID PANEL W/O CHOL/HDL RATIO
Cholesterol, Total: 149 mg/dL (ref 100–199)
HDL: 42 mg/dL (ref >39)
LDL Chol Calc (NIH): 86 mg/dL (ref 0–99)
Triglycerides: 118 mg/dL (ref 0–149)
VLDL Cholesterol Cal: 21 mg/dL (ref 5–40)

## 2021-02-14 NOTE — Assessment & Plan Note (Signed)
Under good control on current regimen. Continue current regimen. Continue to monitor. Call with any concerns. Refills given. Labs drawn today.   

## 2021-02-14 NOTE — Assessment & Plan Note (Addendum)
A1c up slightly at 7.6- continue current regimen. Will increase her trulicity to 3mg . Call with any concerns. Continue to monitor.

## 2021-03-14 ENCOUNTER — Encounter: Payer: Self-pay | Admitting: Family Medicine

## 2021-03-16 ENCOUNTER — Telehealth: Payer: Self-pay | Admitting: Family Medicine

## 2021-03-16 NOTE — Telephone Encounter (Signed)
Not high priorty

## 2021-03-16 NOTE — Telephone Encounter (Signed)
Called patient to discuss medication, no answer, left a voicemail for patient to return my call.  Ok for nurse triage to discuss with patient if she calls back.

## 2021-03-16 NOTE — Telephone Encounter (Signed)
Please have her go back to her 1.5mg - if not getting better will need to be seen.   Please also remind patient that mychart messages can take a few days to get a response. For fastest responses- please call and we'll get back to her ASAP as we did here.

## 2021-03-16 NOTE — Telephone Encounter (Signed)
Patient is calling again because she is having a problem with her medication, Trulicity.  She has sent several messages on My Chart and has not gotten a response.  Please advise and call patient to discuss at [phone]

## 2021-03-17 NOTE — Telephone Encounter (Signed)
Pt returned call. Message from Dr. Laural Benes reviewed, pt verbalizes understanding. Pt would like Dr. Laural Benes to know she did take "The double dose Tuesday and was nauseated by Thursday. Seems to happen 2 days after dose." States she has 2 )  1.5mg  tabs left for next 2 weeks. Pt questioning if this is enough time "To know what dose I should take."

## 2021-03-18 NOTE — Telephone Encounter (Signed)
Patient notified and verbalized understanding. 

## 2021-03-18 NOTE — Telephone Encounter (Signed)
It should be. Have her take 1.5mg  for the next 2 weeks, and let me know how she feels

## 2021-05-13 ENCOUNTER — Ambulatory Visit: Payer: BC Managed Care – PPO | Admitting: Family Medicine

## 2021-05-31 ENCOUNTER — Other Ambulatory Visit: Payer: Self-pay | Admitting: Family Medicine

## 2021-06-15 ENCOUNTER — Other Ambulatory Visit: Payer: Self-pay

## 2021-06-15 ENCOUNTER — Encounter: Payer: Self-pay | Admitting: Family Medicine

## 2021-06-15 ENCOUNTER — Ambulatory Visit: Payer: BC Managed Care – PPO | Admitting: Family Medicine

## 2021-06-15 VITALS — BP 117/70 | HR 72 | Temp 98.1°F | Ht 62.0 in | Wt 131.0 lb

## 2021-06-15 DIAGNOSIS — F419 Anxiety disorder, unspecified: Secondary | ICD-10-CM | POA: Diagnosis not present

## 2021-06-15 DIAGNOSIS — R079 Chest pain, unspecified: Secondary | ICD-10-CM

## 2021-06-15 DIAGNOSIS — E1129 Type 2 diabetes mellitus with other diabetic kidney complication: Secondary | ICD-10-CM

## 2021-06-15 DIAGNOSIS — R809 Proteinuria, unspecified: Secondary | ICD-10-CM

## 2021-06-15 DIAGNOSIS — K5900 Constipation, unspecified: Secondary | ICD-10-CM

## 2021-06-15 DIAGNOSIS — I208 Other forms of angina pectoris: Secondary | ICD-10-CM | POA: Diagnosis not present

## 2021-06-15 DIAGNOSIS — E7800 Pure hypercholesterolemia, unspecified: Secondary | ICD-10-CM | POA: Diagnosis not present

## 2021-06-15 DIAGNOSIS — I2510 Atherosclerotic heart disease of native coronary artery without angina pectoris: Secondary | ICD-10-CM | POA: Diagnosis not present

## 2021-06-15 DIAGNOSIS — R0602 Shortness of breath: Secondary | ICD-10-CM | POA: Diagnosis not present

## 2021-06-15 LAB — BAYER DCA HB A1C WAIVED: HB A1C (BAYER DCA - WAIVED): 6.2 % (ref <7.0)

## 2021-06-15 MED ORDER — EMPAGLIFLOZIN 25 MG PO TABS: ORAL_TABLET | ORAL | 1 refills | Status: DC

## 2021-06-15 MED ORDER — ESCITALOPRAM OXALATE 20 MG PO TABS: 20.0000 mg | ORAL_TABLET | Freq: Every day | ORAL | 1 refills | Status: DC

## 2021-06-15 MED ORDER — SIMVASTATIN 40 MG PO TABS: 40.0000 mg | ORAL_TABLET | Freq: Every day | ORAL | 1 refills | Status: DC

## 2021-06-15 MED ORDER — OMEPRAZOLE 20 MG PO CPDR: 20.0000 mg | DELAYED_RELEASE_CAPSULE | Freq: Every day | ORAL | 3 refills | Status: DC

## 2021-06-15 NOTE — Assessment & Plan Note (Signed)
Under good control on current regimen. Continue current regimen. Continue to monitor. Call with any concerns. Refills given. Still has a refill on her lorazepam at the pharmacy. Call with any concerns.

## 2021-06-15 NOTE — Progress Notes (Signed)
BP 117/70   Pulse 72   Temp 98.1 F (36.7 C) (Oral)   Ht '5\' 2"'  (1.575 m)   Wt 131 lb (59.4 kg)   SpO2 98%   BMI 23.96 kg/m    Subjective:    Patient ID: Brianna Leon, female    DOB: December 25, 1955, 65 y.o.   MRN: [id number]  HPI: Brianna Leon is a 65 y.o. female  Chief Complaint  Patient presents with   Diabetes    3 month f/up   DIABETES Hypoglycemic episodes:no Polydipsia/polyuria: no Visual disturbance: no Chest pain: yes Paresthesias: no Glucose Monitoring: no  Accucheck frequency: Not Checking Taking Insulin?: no Blood Pressure Monitoring: not checking Retinal Examination: Up to Date Foot Exam: Up to Date Diabetic Education: Completed Pneumovax: Up to Date Influenza: Up to Date Aspirin: yes  ANXIETY/STRESS Duration: chronic Status:stable Anxious mood: yes  Excessive worrying: yes Irritability: no  Sweating: no Nausea: no Palpitations:no Hyperventilation: no Panic attacks: no Agoraphobia: no  Obscessions/compulsions: no Depressed mood: no Depression screen [facility] 2/9 02/11/2021 04/14/2020 12/05/2019 09/12/2019 06/25/2019  Decreased Interest 0 0 0 2 0  Down, Depressed, Hopeless 0 2 0 0 1  PHQ - 2 Score 0 2 0 2 1  Altered sleeping - 2 0 1 3  Tired, decreased energy - 2 0 1 3  Change in appetite - 1 0 0 2  Feeling bad or failure about yourself  - 0 0 0 0  Trouble concentrating - 0 0 0 0  Moving slowly or fidgety/restless - 0 0 0 3  Suicidal thoughts - 0 0 0 0  PHQ-9 Score - 7 0 4 12  Difficult doing work/chores - - Not difficult at all Somewhat difficult Very difficult  Some recent data might be hidden   Anhedonia: no Weight changes: no Insomnia: no   Hypersomnia: no Fatigue/loss of energy: yes Feelings of worthlessness: no Feelings of guilt: no Impaired concentration/indecisiveness: no Suicidal ideations: no  Crying spells: no Recent Stressors/Life Changes: yes   Relationship problems: no   Family stress: yes     Financial stress: no     Job stress: no    Recent death/loss: no  Relevant past medical, surgical, family and social history reviewed and updated as indicated. Interim medical history since our last visit reviewed. Allergies and medications reviewed and updated.  Review of Systems  Constitutional: Negative.   HENT: Negative.    Respiratory: Negative.    Cardiovascular:  Positive for chest pain. Negative for palpitations and leg swelling.  Gastrointestinal:  Positive for abdominal distention, constipation (having a bm a couple of times a day- has been really dry) and nausea. Negative for abdominal pain, anal bleeding, blood in stool, diarrhea, rectal pain and vomiting.  Neurological:  Positive for light-headedness. Negative for dizziness, tremors, seizures, syncope, facial asymmetry, speech difficulty, weakness, numbness and headaches.  Psychiatric/Behavioral: Negative.     Per HPI unless specifically indicated above     Objective:    BP 117/70   Pulse 72   Temp 98.1 F (36.7 C) (Oral)   Ht '5\' 2"'  (1.575 m)   Wt 131 lb (59.4 kg)   SpO2 98%   BMI 23.96 kg/m   Wt Readings from Last 3 Encounters:  06/15/21 131 lb (59.4 kg)  02/11/21 129 lb 3.2 oz (58.6 kg)  11/12/20 126 lb 12.8 oz (57.5 kg)    Physical Exam Vitals and nursing note reviewed.  Constitutional:      General: She is not in  acute distress.    Appearance: Normal appearance. She is not ill-appearing, toxic-appearing or diaphoretic.  HENT:     Head: Normocephalic and atraumatic.     Right Ear: External ear normal.     Left Ear: External ear normal.     Nose: Nose normal.     Mouth/Throat:     Mouth: Mucous membranes are moist.     Pharynx: Oropharynx is clear.  Eyes:     General: No scleral icterus.       Right eye: No discharge.        Left eye: No discharge.     Extraocular Movements: Extraocular movements intact.     Conjunctiva/sclera: Conjunctivae normal.     Pupils: Pupils are equal, round, and reactive to light.   Cardiovascular:     Rate and Rhythm: Normal rate and regular rhythm.     Pulses: Normal pulses.     Heart sounds: Normal heart sounds. No murmur heard.   No friction rub. No gallop.  Pulmonary:     Effort: Pulmonary effort is normal. No respiratory distress.     Breath sounds: Normal breath sounds. No stridor. No wheezing, rhonchi or rales.  Chest:     Chest wall: No tenderness.  Musculoskeletal:        General: Normal range of motion.     Cervical back: Normal range of motion and neck supple.  Skin:    General: Skin is warm and dry.     Capillary Refill: Capillary refill takes less than 2 seconds.     Coloration: Skin is not jaundiced or pale.     Findings: No bruising, erythema, lesion or rash.  Neurological:     General: No focal deficit present.     Mental Status: She is alert and oriented to person, place, and time. Mental status is at baseline.  Psychiatric:        Mood and Affect: Mood normal.        Behavior: Behavior normal.        Thought Content: Thought content normal.        Judgment: Judgment normal.    Results for orders placed or performed in visit on 02/11/21  CBC with Differential/Platelet  Result Value Ref Range   WBC 8.8 3.4 - 10.8 x10E3/uL   RBC 4.32 3.77 - 5.28 x10E6/uL   Hemoglobin 14.1 11.1 - 15.9 g/dL   Hematocrit 40.9 34.0 - 46.6 %   MCV 95 79 - 97 fL   MCH 32.6 26.6 - 33.0 pg   MCHC 34.5 31.5 - 35.7 g/dL   RDW 11.0 (L) 11.7 - 15.4 %   Platelets 257 150 - 450 x10E3/uL   Neutrophils 63 Not Estab. %   Lymphs 27 Not Estab. %   Monocytes 7 Not Estab. %   Eos 2 Not Estab. %   Basos 1 Not Estab. %   Neutrophils Absolute 5.5 1.4 - 7.0 x10E3/uL   Lymphocytes Absolute 2.4 0.7 - 3.1 x10E3/uL   Monocytes Absolute 0.6 0.1 - 0.9 x10E3/uL   EOS (ABSOLUTE) 0.2 0.0 - 0.4 x10E3/uL   Basophils Absolute 0.1 0.0 - 0.2 x10E3/uL   Immature Granulocytes 0 Not Estab. %   Immature Grans (Abs) 0.0 0.0 - 0.1 x10E3/uL  Comprehensive metabolic panel  Result Value  Ref Range   Glucose 191 (H) 65 - 99 mg/dL   BUN 14 8 - 27 mg/dL   Creatinine, Ser 0.59 0.57 - 1.00 mg/dL   eGFR 101 >59 mL/min/1.73  BUN/Creatinine Ratio 24 12 - 28   Sodium 141 134 - 144 mmol/L   Potassium 4.8 3.5 - 5.2 mmol/L   Chloride 100 96 - 106 mmol/L   CO2 25 20 - 29 mmol/L   Calcium 9.8 8.7 - 10.3 mg/dL   Total Protein 7.5 6.0 - 8.5 g/dL   Albumin 5.0 (H) 3.8 - 4.8 g/dL   Globulin, Total 2.5 1.5 - 4.5 g/dL   Albumin/Globulin Ratio 2.0 1.2 - 2.2   Bilirubin Total 0.4 0.0 - 1.2 mg/dL   Alkaline Phosphatase 84 44 - 121 IU/L   AST 17 0 - 40 IU/L   ALT 19 0 - 32 IU/L  Lipid Panel w/o Chol/HDL Ratio  Result Value Ref Range   Cholesterol, Total 149 100 - 199 mg/dL   Triglycerides 118 0 - 149 mg/dL   HDL 42 >39 mg/dL   VLDL Cholesterol Cal 21 5 - 40 mg/dL   LDL Chol Calc (NIH) 86 0 - 99 mg/dL  Urinalysis, Routine w reflex microscopic  Result Value Ref Range   Specific Gravity, UA 1.015 1.005 - 1.030   pH, UA 5.0 5.0 - 7.5   Color, UA Yellow Yellow   Appearance Ur Clear Clear   Leukocytes,UA Negative Negative   Protein,UA Negative Negative/Trace   Glucose, UA 3+ (A) Negative   Ketones, UA Negative Negative   RBC, UA Negative Negative   Bilirubin, UA Negative Negative   Urobilinogen, Ur 0.2 0.2 - 1.0 mg/dL   Nitrite, UA Negative Negative  TSH  Result Value Ref Range   TSH 0.766 0.450 - 4.500 uIU/mL  Bayer DCA Hb A1c Waived  Result Value Ref Range   HB A1C (BAYER DCA - WAIVED) 7.6 (H) <7.0 %  Microalbumin, Urine Waived  Result Value Ref Range   Microalb, Ur Waived 10 0 - 19 mg/L   Creatinine, Urine Waived 100 10 - 300 mg/dL   Microalb/Creat Ratio <30 <30 mg/g      Assessment & Plan:   Problem List Items Addressed This Visit       Endocrine   DM (diabetes mellitus), type 2 ([place]) - Primary    Doing well with A1c of 6.2. Continue current regimen. Continue to monitor. Call with any concerns.        Relevant Medications   empagliflozin (JARDIANCE) 25 MG TABS  tablet   simvastatin (ZOCOR) 40 MG tablet   Other Relevant Orders   Bayer DCA Hb A1c Waived     Other   Anxiety    Under good control on current regimen. Continue current regimen. Continue to monitor. Call with any concerns. Refills given. Still has a refill on her lorazepam at the pharmacy. Call with any concerns.          Relevant Medications   escitalopram (LEXAPRO) 20 MG tablet   Other Visit Diagnoses     Constipation, unspecified constipation type       Advised increased fluids and PRN miralax. Call with any concerns.    Chest pain, unspecified type       Seeing Cardiology in 1/2 an hour. Will defer to them.        Follow up plan: Return in about 3 months (around 09/15/2021).

## 2021-06-15 NOTE — Assessment & Plan Note (Signed)
Doing well with A1c of 6.2. Continue current regimen. Continue to monitor. Call with any concerns.  ?

## 2021-08-01 DIAGNOSIS — R0602 Shortness of breath: Secondary | ICD-10-CM | POA: Diagnosis not present

## 2021-08-01 DIAGNOSIS — I208 Other forms of angina pectoris: Secondary | ICD-10-CM | POA: Diagnosis not present

## 2021-09-16 ENCOUNTER — Ambulatory Visit: Payer: BC Managed Care – PPO | Admitting: Family Medicine

## 2021-10-23 ENCOUNTER — Other Ambulatory Visit: Payer: Self-pay | Admitting: Family Medicine

## 2021-10-24 NOTE — Telephone Encounter (Signed)
Requested Prescriptions  Pending Prescriptions Disp Refills   TRULICITY 3 MG/0.5ML SOPN [Pharmacy Med Name: TRULICITY 3MG /0.5ML SDP 0.5ML] 6 mL 1    Sig: INJECT 3 MG UNDER THE SKIN ONCE A WEEK     Endocrinology:  Diabetes - GLP-1 Receptor Agonists Passed - 10/23/2021  9:01 PM      Passed - HBA1C is between 0 and 7.9 and within 180 days    Hemoglobin A1C  Date Value Ref Range Status  04/03/2017 per CE  Final  04/03/2017 9.4  Final   HB A1C (BAYER DCA - WAIVED)  Date Value Ref Range Status  06/15/2021 6.2 <7.0 % Final    Comment:                                          Diabetic Adult            <7.0                                       Healthy Adult        4.3 - 5.7                                                           (DCCT/NGSP) American Diabetes Association's Summary of Glycemic Recommendations for Adults with Diabetes: Hemoglobin A1c <7.0%. More stringent glycemic goals (A1c <6.0%) may further reduce complications at the cost of increased risk of hypoglycemia.          Passed - Valid encounter within last 6 months    Recent Outpatient Visits          4 months ago Type 2 diabetes mellitus with microalbuminuria, without long-term current use of insulin (HCC)   [facility] [place], Megan P, DO   8 months ago Routine general medical examination at a health care facility   [facility] P, DO   11 months ago Type 2 diabetes mellitus with microalbuminuria, without long-term current use of insulin [facility])   [facility], Megan P, DO   1 year ago Type 2 diabetes mellitus with microalbuminuria, without long-term current use of insulin (HCC)   [facility] [place], Megan P, DO   1 year ago Mixed hyperlipidemia   [facility] [place], [place], DO

## 2021-11-21 DIAGNOSIS — R300 Dysuria: Secondary | ICD-10-CM | POA: Diagnosis not present

## 2022-01-07 DIAGNOSIS — J0140 Acute pansinusitis, unspecified: Secondary | ICD-10-CM | POA: Diagnosis not present

## 2022-01-07 DIAGNOSIS — Z20818 Contact with and (suspected) exposure to other bacterial communicable diseases: Secondary | ICD-10-CM | POA: Diagnosis not present

## 2022-01-07 DIAGNOSIS — J029 Acute pharyngitis, unspecified: Secondary | ICD-10-CM | POA: Diagnosis not present

## 2022-02-01 ENCOUNTER — Encounter: Payer: Self-pay | Admitting: Family Medicine

## 2022-02-01 ENCOUNTER — Ambulatory Visit: Payer: BC Managed Care – PPO | Admitting: Family Medicine

## 2022-02-01 ENCOUNTER — Other Ambulatory Visit: Payer: Self-pay

## 2022-02-01 ENCOUNTER — Other Ambulatory Visit: Payer: Self-pay | Admitting: Family Medicine

## 2022-02-01 VITALS — BP 118/66 | HR 70 | Temp 97.7°F | Wt 128.0 lb

## 2022-02-01 DIAGNOSIS — Z1231 Encounter for screening mammogram for malignant neoplasm of breast: Secondary | ICD-10-CM

## 2022-02-01 DIAGNOSIS — N816 Rectocele: Secondary | ICD-10-CM | POA: Diagnosis not present

## 2022-02-01 DIAGNOSIS — E1129 Type 2 diabetes mellitus with other diabetic kidney complication: Secondary | ICD-10-CM

## 2022-02-01 DIAGNOSIS — I129 Hypertensive chronic kidney disease with stage 1 through stage 4 chronic kidney disease, or unspecified chronic kidney disease: Secondary | ICD-10-CM | POA: Diagnosis not present

## 2022-02-01 DIAGNOSIS — E782 Mixed hyperlipidemia: Secondary | ICD-10-CM | POA: Diagnosis not present

## 2022-02-01 DIAGNOSIS — J301 Allergic rhinitis due to pollen: Secondary | ICD-10-CM

## 2022-02-01 DIAGNOSIS — F419 Anxiety disorder, unspecified: Secondary | ICD-10-CM

## 2022-02-01 DIAGNOSIS — Z1331 Encounter for screening for depression: Secondary | ICD-10-CM | POA: Diagnosis not present

## 2022-02-01 DIAGNOSIS — R809 Proteinuria, unspecified: Secondary | ICD-10-CM

## 2022-02-01 DIAGNOSIS — N8111 Cystocele, midline: Secondary | ICD-10-CM | POA: Diagnosis not present

## 2022-02-01 DIAGNOSIS — K219 Gastro-esophageal reflux disease without esophagitis: Secondary | ICD-10-CM

## 2022-02-01 DIAGNOSIS — K582 Mixed irritable bowel syndrome: Secondary | ICD-10-CM

## 2022-02-01 DIAGNOSIS — Z01419 Encounter for gynecological examination (general) (routine) without abnormal findings: Secondary | ICD-10-CM | POA: Diagnosis not present

## 2022-02-01 LAB — URINALYSIS, ROUTINE W REFLEX MICROSCOPIC
Bilirubin, UA: NEGATIVE
Leukocytes,UA: NEGATIVE
Nitrite, UA: NEGATIVE
Protein,UA: NEGATIVE
RBC, UA: NEGATIVE
Specific Gravity, UA: 1.02 (ref 1.005–1.030)
Urobilinogen, Ur: 0.2 mg/dL (ref 0.2–1.0)
pH, UA: 5.5 (ref 5.0–7.5)

## 2022-02-01 LAB — MICROALBUMIN, URINE WAIVED
Creatinine, Urine Waived: 100 mg/dL (ref 10–300)
Microalb, Ur Waived: 10 mg/L (ref 0–19)
Microalb/Creat Ratio: <30 mg/g (ref <30)

## 2022-02-01 LAB — BAYER DCA HB A1C WAIVED: HB A1C (BAYER DCA - WAIVED): 7.3 % — ABNORMAL HIGH (ref 4.8–5.6)

## 2022-02-01 MED ORDER — EPINEPHRINE 0.3 MG/0.3ML IJ SOAJ: INTRAMUSCULAR | 12 refills | Status: AC

## 2022-02-01 MED ORDER — PREDNISONE 50 MG PO TABS: 50.0000 mg | ORAL_TABLET | Freq: Every day | ORAL | 0 refills | Status: DC

## 2022-02-01 MED ORDER — EMPAGLIFLOZIN 25 MG PO TABS: ORAL_TABLET | ORAL | 1 refills | Status: DC

## 2022-02-01 MED ORDER — SIMVASTATIN 40 MG PO TABS: 40.0000 mg | ORAL_TABLET | Freq: Every day | ORAL | 1 refills | Status: DC

## 2022-02-01 MED ORDER — TRULICITY 3 MG/0.5ML ~~LOC~~ SOAJ: SUBCUTANEOUS | 1 refills | Status: DC

## 2022-02-01 MED ORDER — LORAZEPAM 0.5 MG PO TABS
0.5000 mg | ORAL_TABLET | Freq: Two times a day (BID) | ORAL | 1 refills | Status: DC | PRN
Start: 2022-02-01 — End: 2022-08-04

## 2022-02-01 MED ORDER — ESCITALOPRAM OXALATE 20 MG PO TABS
20.0000 mg | ORAL_TABLET | Freq: Every day | ORAL | 1 refills | Status: DC
Start: 2022-02-01 — End: 2022-08-04

## 2022-02-01 MED ORDER — OMEPRAZOLE 20 MG PO CPDR: 20.0000 mg | DELAYED_RELEASE_CAPSULE | Freq: Every day | ORAL | 3 refills | Status: DC

## 2022-02-01 MED ORDER — DICYCLOMINE HCL 10 MG PO CAPS: 10.0000 mg | ORAL_CAPSULE | Freq: Three times a day (TID) | ORAL | 3 refills | Status: DC

## 2022-02-01 NOTE — Assessment & Plan Note (Signed)
Up with A1c of 7.3 from 6.2. Really work on taking medicine and watching diet. Recheck 3 months.  ?

## 2022-02-01 NOTE — Progress Notes (Signed)
? ?BP 118/66   Pulse 70   Temp 97.7 ?F (36.5 ?C)   Wt 128 lb (58.1 kg)   SpO2 98%   BMI 23.41 kg/m?   ? ?Subjective:  ? ? Patient ID: Brianna Leon, female    DOB: 1956/04/14, 66 y.o.   MRN: [id number] ? ?HPI: ?Brianna Leon is a 66 y.o. female ? ?Chief Complaint  ?Patient presents with  ? Diabetes  ?  Patient has eye exam scheduled for May   ? Hypertension  ? Hyperlipidemia  ? Sinusitis  ?  Patient states for about 2 weeks she feels like she has drainage in the back of her throat, nasal congestion, and pressure in her face. Has tried allergy medication otc   ? ?UPPER RESPIRATORY TRACT INFECTION ?Duration: 2 weeks ?Worst symptom: fascial pain ?Fever: no ?Cough: no ?Shortness of breath: no ?Wheezing: no ?Chest pain: no ?Chest tightness: no ?Chest congestion: no ?Nasal congestion: yes ?Runny nose: yes ?Post nasal drip: yes ?Sneezing: no ?Sore throat: no ?Swollen glands: no ?Sinus pressure: yes ?Headache: yes ?Face pain: no ?Toothache: no ?Ear pain: no  ?Ear pressure: no  ?Eyes red/itching:no ?Eye drainage/crusting: no  ?Vomiting: no ?Rash: no ?Fatigue: yes ?Sick contacts: no ?Strep contacts: no  ?Context: stable ?Recurrent sinusitis: no ?Relief with OTC cold/cough medications: no  ?Treatments attempted: anti-histamine and pseudoephedrine  ? ?DIABETES ?Hypoglycemic episodes:no ?Polydipsia/polyuria: no ?Visual disturbance: no ?Chest pain: no ?Paresthesias: no ?Glucose Monitoring: yes ? Accucheck frequency:  occasionally ?Taking Insulin?: no ?Blood Pressure Monitoring: not checking ?Retinal Examination: Not up to Date ?Foot Exam: Up to Date ?Diabetic Education: Not Completed ?Pneumovax: Up to Date ?Influenza:Not Up to Date ?Aspirin: yes ? ?HYPERTENSION / HYPERLIPIDEMIA ?Satisfied with current treatment? yes ?Duration of hypertension: chronic ?BP monitoring frequency: not checking ?BP medication side effects: no ?Past BP meds: none due to BP ?Duration of hyperlipidemia: chronic ?Cholesterol medication side  effects: no ?Cholesterol supplements: none ?Past cholesterol medications: simvastatin ?Medication compliance: excellent compliance ?Aspirin: yes ?Recent stressors: yes ?Recurrent headaches: no ?Visual changes: no ?Palpitations: no ?Dyspnea: no ?Chest pain: no ?Lower extremity edema: no ?Dizzy/lightheaded: no ? ?ANXIETY/DEPRESSION- using lorazepam very occasionally. ?Duration: chronic ?Status:controlled ?Anxious mood: yes  ?Excessive worrying: no ?Irritability: no  ?Sweating: no ?Nausea: no ?Palpitations:no ?Hyperventilation: no ?Panic attacks: no ?Agoraphobia: no  ?Obscessions/compulsions: no ?Depressed mood: no ? ?  02/01/2022  ? 11:18 AM 02/11/2021  ?  3:07 PM 04/14/2020  ? 11:24 AM 12/05/2019  ?  4:02 PM 09/12/2019  ?  3:46 PM  ?Depression screen PHQ 2/9  ?Decreased Interest 0 0 0 0 2  ?Down, Depressed, Hopeless 0 0 2 0 0  ?PHQ - 2 Score 0 0 2 0 2  ?Altered sleeping 0  2 0 1  ?Tired, decreased energy 0  2 0 1  ?Change in appetite 0  1 0 0  ?Feeling bad or failure about yourself  0  0 0 0  ?Trouble concentrating 0  0 0 0  ?Moving slowly or fidgety/restless 0  0 0 0  ?Suicidal thoughts 0  0 0 0  ?PHQ-9 Score 0  7 0 4  ?Difficult doing work/chores    Not difficult at all Somewhat difficult  ? ?Anhedonia: no ?Weight changes: no ?Insomnia: yes hard to fall asleep  ?Hypersomnia: no ?Fatigue/loss of energy: yes ?Feelings of worthlessness: no ?Feelings of guilt: no ?Impaired concentration/indecisiveness: no ?Suicidal ideations: no  ?Crying spells: no ?Recent Stressors/Life Changes: no ?  Relationship problems: no ?  Family stress:  no   ?  Financial stress: no  ?  Job stress: no  ?  Recent death/loss: no ? ? ?Relevant past medical, surgical, family and social history reviewed and updated as indicated. Interim medical history since our last visit reviewed. ?Allergies and medications reviewed and updated. ? ?Review of Systems  ?Constitutional: Negative.   ?HENT:  Positive for congestion, postnasal drip, rhinorrhea and sinus  pressure. Negative for dental problem, drooling, ear discharge, ear pain, facial swelling, hearing loss, mouth sores, nosebleeds, sinus pain, sneezing, sore throat, tinnitus, trouble swallowing and voice change.   ?Eyes: Negative.   ?Respiratory: Negative.    ?Cardiovascular: Negative.   ?Gastrointestinal:  Positive for abdominal pain, constipation, nausea and vomiting. Negative for abdominal distention, anal bleeding, blood in stool, diarrhea and rectal pain.  ?Neurological: Negative.   ?Psychiatric/Behavioral: Negative.    ? ?Per HPI unless specifically indicated above ? ?   ?Objective:  ?  ?BP 118/66   Pulse 70   Temp 97.7 ?F (36.5 ?C)   Wt 128 lb (58.1 kg)   SpO2 98%   BMI 23.41 kg/m?   ?Wt Readings from Last 3 Encounters:  ?02/01/22 128 lb (58.1 kg)  ?06/15/21 131 lb (59.4 kg)  ?02/11/21 129 lb 3.2 oz (58.6 kg)  ?  ?Physical Exam ?Vitals and nursing note reviewed.  ?Constitutional:   ?   General: She is not in acute distress. ?   Appearance: Normal appearance. She is normal weight. She is not ill-appearing, toxic-appearing or diaphoretic.  ?HENT:  ?   Head: Normocephalic and atraumatic.  ?   Right Ear: Tympanic membrane, ear canal and external ear normal.  ?   Left Ear: Tympanic membrane, ear canal and external ear normal.  ?   Nose: Nose normal. No congestion or rhinorrhea.  ?   Mouth/Throat:  ?   Mouth: Mucous membranes are moist.  ?   Pharynx: Oropharynx is clear. No oropharyngeal exudate or posterior oropharyngeal erythema.  ?Eyes:  ?   General: No scleral icterus.    ?   Right eye: No discharge.     ?   Left eye: No discharge.  ?   Extraocular Movements: Extraocular movements intact.  ?   Conjunctiva/sclera: Conjunctivae normal.  ?   Pupils: Pupils are equal, round, and reactive to light.  ?Cardiovascular:  ?   Rate and Rhythm: Normal rate and regular rhythm.  ?   Pulses: Normal pulses.  ?   Heart sounds: Normal heart sounds. No murmur heard. ?  No friction rub. No gallop.  ?Pulmonary:  ?   Effort:  Pulmonary effort is normal. No respiratory distress.  ?   Breath sounds: Normal breath sounds. No stridor. No wheezing, rhonchi or rales.  ?Chest:  ?   Chest wall: No tenderness.  ?Musculoskeletal:     ?   General: Normal range of motion.  ?   Cervical back: Normal range of motion and neck supple.  ?Skin: ?   General: Skin is warm and dry.  ?   Capillary Refill: Capillary refill takes less than 2 seconds.  ?   Coloration: Skin is not jaundiced or pale.  ?   Findings: No bruising, erythema, lesion or rash.  ?Neurological:  ?   General: No focal deficit present.  ?   Mental Status: She is alert and oriented to person, place, and time. Mental status is at baseline.  ?Psychiatric:     ?   Mood and Affect: Mood normal.     ?  Behavior: Behavior normal.     ?   Thought Content: Thought content normal.     ?   Judgment: Judgment normal.  ? ? ?Results for orders placed or performed in visit on 02/01/22  ?Bayer DCA Hb A1c Waived  ?Result Value Ref Range  ? HB A1C (BAYER DCA - WAIVED) 7.3 (H) 4.8 - 5.6 %  ?Microalbumin, Urine Waived  ?Result Value Ref Range  ? Microalb, Ur Waived 10 0 - 19 mg/L  ? Creatinine, Urine Waived 100 10 - 300 mg/dL  ? Microalb/Creat Ratio <30 <30 mg/g  ?Urinalysis, Routine w reflex microscopic  ?Result Value Ref Range  ? Specific Gravity, UA 1.020 1.005 - 1.030  ? pH, UA 5.5 5.0 - 7.5  ? Color, UA Yellow Yellow  ? Appearance Ur Clear Clear  ? Leukocytes,UA Negative Negative  ? Protein,UA Negative Negative/Trace  ? Glucose, UA 3+ (A) Negative  ? Ketones, UA Trace (A) Negative  ? RBC, UA Negative Negative  ? Bilirubin, UA Negative Negative  ? Urobilinogen, Ur 0.2 0.2 - 1.0 mg/dL  ? Nitrite, UA Negative Negative  ? ?   ?Assessment & Plan:  ? ?Problem List Items Addressed This Visit   ? ?  ? Digestive  ? GERD (gastroesophageal reflux disease)  ?  Under good control on current regimen. Continue current regimen. Continue to monitor. Call with any concerns. Refills given. Labs drawn today. ? ?  ?  ?  Relevant Medications  ? dicyclomine (BENTYL) 10 MG capsule  ? omeprazole (PRILOSEC) 20 MG capsule  ? Other Relevant Orders  ? Comprehensive metabolic panel  ? CBC with Differential/Platelet  ? Irritable bowel syndrome with both con

## 2022-02-01 NOTE — Assessment & Plan Note (Signed)
Under good control on current regimen. Continue current regimen. Continue to monitor. Call with any concerns. Refills given. Labs drawn today.   

## 2022-02-01 NOTE — Assessment & Plan Note (Signed)
Under good control off medicine. Continue to monitor. Call with any concerns. Labs drawn today. 

## 2022-02-01 NOTE — Assessment & Plan Note (Signed)
Under good control on current regimen. Continue current regimen. Continue to monitor. Call with any concerns. Refills given.   

## 2022-02-01 NOTE — Assessment & Plan Note (Signed)
Will add bentyl and get her into see GI. Continue to monitor.  ?

## 2022-02-02 LAB — COMPREHENSIVE METABOLIC PANEL
ALT: 28 IU/L (ref 0–32)
AST: 25 IU/L (ref 0–40)
Albumin/Globulin Ratio: 1.8 (ref 1.2–2.2)
Albumin: 4.8 g/dL (ref 3.8–4.8)
Alkaline Phosphatase: 77 IU/L (ref 44–121)
BUN/Creatinine Ratio: 28 (ref 12–28)
BUN: 16 mg/dL (ref 8–27)
Bilirubin Total: 0.3 mg/dL (ref 0.0–1.2)
CO2: 27 mmol/L (ref 20–29)
Calcium: 9.8 mg/dL (ref 8.7–10.3)
Chloride: 99 mmol/L (ref 96–106)
Creatinine, Ser: 0.58 mg/dL (ref 0.57–1.00)
Globulin, Total: 2.6 g/dL (ref 1.5–4.5)
Glucose: 153 mg/dL — ABNORMAL HIGH (ref 70–99)
Potassium: 4.2 mmol/L (ref 3.5–5.2)
Sodium: 140 mmol/L (ref 134–144)
Total Protein: 7.4 g/dL (ref 6.0–8.5)
eGFR: 101 mL/min/{1.73_m2} (ref >59)

## 2022-02-02 LAB — CBC WITH DIFFERENTIAL/PLATELET
Basophils Absolute: 0 10*3/uL (ref 0.0–0.2)
Basos: 1 %
EOS (ABSOLUTE): 0.2 10*3/uL (ref 0.0–0.4)
Eos: 2 %
Hematocrit: 43.5 % (ref 34.0–46.6)
Hemoglobin: 14.5 g/dL (ref 11.1–15.9)
Immature Grans (Abs): 0 10*3/uL (ref 0.0–0.1)
Immature Granulocytes: 0 %
Lymphocytes Absolute: 2.5 10*3/uL (ref 0.7–3.1)
Lymphs: 30 %
MCH: 31.8 pg (ref 26.6–33.0)
MCHC: 33.3 g/dL (ref 31.5–35.7)
MCV: 95 fL (ref 79–97)
Monocytes Absolute: 0.6 10*3/uL (ref 0.1–0.9)
Monocytes: 7 %
Neutrophils Absolute: 5.1 10*3/uL (ref 1.4–7.0)
Neutrophils: 60 %
Platelets: 235 10*3/uL (ref 150–450)
RBC: 4.56 x10E6/uL (ref 3.77–5.28)
RDW: 11.5 % — ABNORMAL LOW (ref 11.7–15.4)
WBC: 8.4 10*3/uL (ref 3.4–10.8)

## 2022-02-02 LAB — LIPID PANEL W/O CHOL/HDL RATIO
Cholesterol, Total: 163 mg/dL (ref 100–199)
HDL: 40 mg/dL (ref >39)
LDL Chol Calc (NIH): 106 mg/dL — ABNORMAL HIGH (ref 0–99)
Triglycerides: 93 mg/dL (ref 0–149)
VLDL Cholesterol Cal: 17 mg/dL (ref 5–40)

## 2022-02-02 LAB — TSH: TSH: 0.677 u[IU]/mL (ref 0.450–4.500)

## 2022-02-06 ENCOUNTER — Telehealth: Payer: Self-pay

## 2022-02-06 NOTE — Telephone Encounter (Signed)
Scheduled for in person visit  ?

## 2022-03-09 ENCOUNTER — Ambulatory Visit
Admission: RE | Admit: 2022-03-09 | Discharge: 2022-03-09 | Disposition: A | Discharge status: Home / Self Care | Payer: BC Managed Care – PPO | Source: Ambulatory Visit | Attending: Family Medicine | Admitting: Family Medicine

## 2022-03-09 DIAGNOSIS — Z1231 Encounter for screening mammogram for malignant neoplasm of breast: Secondary | ICD-10-CM | POA: Insufficient documentation

## 2022-03-20 ENCOUNTER — Ambulatory Visit: Payer: BC Managed Care – PPO | Admitting: Gastroenterology

## 2022-05-04 ENCOUNTER — Ambulatory Visit: Payer: BC Managed Care – PPO | Admitting: Family Medicine

## 2022-05-04 ENCOUNTER — Encounter: Payer: Self-pay | Admitting: Family Medicine

## 2022-05-04 VITALS — BP 126/70 | HR 69 | Temp 98.3°F | Wt 132.0 lb

## 2022-05-04 DIAGNOSIS — Z23 Encounter for immunization: Secondary | ICD-10-CM | POA: Diagnosis not present

## 2022-05-04 DIAGNOSIS — E1129 Type 2 diabetes mellitus with other diabetic kidney complication: Secondary | ICD-10-CM

## 2022-05-04 DIAGNOSIS — R809 Proteinuria, unspecified: Secondary | ICD-10-CM

## 2022-05-04 LAB — BAYER DCA HB A1C WAIVED: HB A1C (BAYER DCA - WAIVED): 7.3 % — ABNORMAL HIGH (ref 4.8–5.6)

## 2022-05-04 NOTE — Progress Notes (Signed)
BP 126/70   Pulse 69   Temp 98.3 F (36.8 C) (Oral)   Wt 132 lb (59.9 kg)   SpO2 97%   BMI 24.14 kg/m    Subjective:    Patient ID: Brianna Leon, female    DOB: July 02, 1956, 66 y.o.   MRN: [id number]  HPI: Brianna Leon is a 66 y.o. female  Chief Complaint  Patient presents with   Diabetes    Has eye exam scheduled in July with Dr. Clydene Pugh   DIABETES Hypoglycemic episodes:no Polydipsia/polyuria: no Visual disturbance: no Chest pain: no Paresthesias: no Glucose Monitoring: yes  Accucheck frequency: occasionally  Fasting glucose: 160-170 Taking Insulin?: yes Blood Pressure Monitoring: not checking Retinal Examination: Not up to Date Foot Exam: Up to Date Diabetic Education: Completed Pneumovax: Up to Date Influenza: Up to Date Aspirin: yes  Relevant past medical, surgical, family and social history reviewed and updated as indicated. Interim medical history since our last visit reviewed. Allergies and medications reviewed and updated.  Review of Systems  Constitutional: Negative.   Respiratory: Negative.    Cardiovascular: Negative.   Gastrointestinal: Negative.   Musculoskeletal: Negative.   Neurological: Negative.   Psychiatric/Behavioral: Negative.      Per HPI unless specifically indicated above     Objective:    BP 126/70   Pulse 69   Temp 98.3 F (36.8 C) (Oral)   Wt 132 lb (59.9 kg)   SpO2 97%   BMI 24.14 kg/m   Wt Readings from Last 3 Encounters:  05/04/22 132 lb (59.9 kg)  02/01/22 128 lb (58.1 kg)  06/15/21 131 lb (59.4 kg)    Physical Exam Vitals and nursing note reviewed.  Constitutional:      General: She is not in acute distress.    Appearance: Normal appearance. She is not ill-appearing, toxic-appearing or diaphoretic.  HENT:     Head: Normocephalic and atraumatic.     Right Ear: External ear normal.     Left Ear: External ear normal.     Nose: Nose normal.     Mouth/Throat:     Mouth: Mucous membranes are moist.      Pharynx: Oropharynx is clear.  Eyes:     General: No scleral icterus.       Right eye: No discharge.        Left eye: No discharge.     Extraocular Movements: Extraocular movements intact.     Conjunctiva/sclera: Conjunctivae normal.     Pupils: Pupils are equal, round, and reactive to light.  Cardiovascular:     Rate and Rhythm: Normal rate and regular rhythm.     Pulses: Normal pulses.     Heart sounds: Normal heart sounds. No murmur heard.    No friction rub. No gallop.  Pulmonary:     Effort: Pulmonary effort is normal. No respiratory distress.     Breath sounds: Normal breath sounds. No stridor. No wheezing, rhonchi or rales.  Chest:     Chest wall: No tenderness.  Musculoskeletal:        General: Normal range of motion.     Cervical back: Normal range of motion and neck supple.  Skin:    General: Skin is warm and dry.     Capillary Refill: Capillary refill takes less than 2 seconds.     Coloration: Skin is not jaundiced or pale.     Findings: No bruising, erythema, lesion or rash.  Neurological:     General: No focal deficit present.  Mental Status: She is alert and oriented to person, place, and time. Mental status is at baseline.  Psychiatric:        Mood and Affect: Mood normal.        Behavior: Behavior normal.        Thought Content: Thought content normal.        Judgment: Judgment normal.     Results for orders placed or performed in visit on 05/04/22  Bayer DCA Hb A1c Waived  Result Value Ref Range   HB A1C (BAYER DCA - WAIVED) 7.3 (H) 4.8 - 5.6 %      Assessment & Plan:   Problem List Items Addressed This Visit       Endocrine   DM (diabetes mellitus), type 2 (HCC) - Primary    Stable with A1c of 7.3. Continue current regimen. Call with any concerns. Call with any concerns.      Relevant Orders   Bayer DCA Hb A1c Waived (Completed)     Follow up plan: Return in about 3 months (around 08/04/2022).

## 2022-05-04 NOTE — Assessment & Plan Note (Signed)
Stable with A1c of 7.3. Continue current regimen. Call with any concerns. Call with any concerns.

## 2022-05-06 DIAGNOSIS — E119 Type 2 diabetes mellitus without complications: Secondary | ICD-10-CM | POA: Diagnosis not present

## 2022-05-25 ENCOUNTER — Ambulatory Visit: Payer: BC Managed Care – PPO | Admitting: Gastroenterology

## 2022-05-25 ENCOUNTER — Encounter: Payer: Self-pay | Admitting: Gastroenterology

## 2022-05-25 VITALS — BP 134/69 | HR 99 | Temp 97.3°F | Ht 62.0 in | Wt 131.4 lb

## 2022-05-25 DIAGNOSIS — H353131 Nonexudative age-related macular degeneration, bilateral, early dry stage: Secondary | ICD-10-CM | POA: Diagnosis not present

## 2022-05-25 DIAGNOSIS — E119 Type 2 diabetes mellitus without complications: Secondary | ICD-10-CM | POA: Diagnosis not present

## 2022-05-25 DIAGNOSIS — H2513 Age-related nuclear cataract, bilateral: Secondary | ICD-10-CM | POA: Diagnosis not present

## 2022-05-25 DIAGNOSIS — K5902 Outlet dysfunction constipation: Secondary | ICD-10-CM | POA: Diagnosis not present

## 2022-05-25 DIAGNOSIS — H40013 Open angle with borderline findings, low risk, bilateral: Secondary | ICD-10-CM | POA: Diagnosis not present

## 2022-05-25 LAB — HM DIABETES EYE EXAM

## 2022-05-25 NOTE — Patient Instructions (Signed)

## 2022-05-25 NOTE — Progress Notes (Signed)
[name] MD, MRCP(U.K) [address]  [place]  [place], [place] [address]  Main: [phone]  Fax: [phone]   Gastroenterology Consultation  Referring Provider:     Valerie Roys, DO Primary Care Physician:  Valerie Roys, DO Primary Gastroenterologist:  Dr. [name]  Reason for Consultation:     IBS constipation and diarrhea        HPI:   Brianna Leon is a 66 y.o. y/o female referred for consultation & management  by Dr. Wynetta Emery, Megan P, DO.    She has been referred for IBS constipation and diarrhea.  She states her main issue is constipation can go up to 3 to 4 days without a bowel movement and then have stool the consistency of pebbles she states you recently seen by her gynecologist and told she had a bladder prolapse.  She feels like she has to strain a lot when she needs to have a good bowel movement.  No blood in the stools.  Has tried MiraLAX once a day which has not really helped.  Has been placed on dicyclomine as well as probiotics which really have not helped.  History of cholecystectomy  07/01/2020 colonoscopy 4 mm polyp resected in the cecum otherwise normal.  It was benign.  02/01/2022: TSH normal, hemoglobin 14.5, CMP   Past Medical History:  Diagnosis Date   Arthritis    KNEES   Biliary colic 6/75/9163   CAD (coronary artery disease)    DM (diabetes mellitus), type 2 ([place]) 2000   insulin started 2016   Elevated liver enzymes    Family history of colonic polyps    father   GERD (gastroesophageal reflux disease)    Hemorrhage    DURING PREGNANCY 31 YEARS AGO   Hyperlipidemia    IBS (irritable bowel syndrome)    Migraine    RUQ pain 06/12/2017    Past Surgical History:  Procedure Laterality Date   CARDIAC CATHETERIZATION     x 2   CARPAL TUNNEL RELEASE Right    CHOLECYSTECTOMY N/A 09/20/2017   Procedure: LAPAROSCOPIC CHOLECYSTECTOMY WITH INTRAOPERATIVE CHOLANGIOGRAM;  Surgeon: Robert Bellow, MD;  Location: [facility] ORS;   Service: General;  Laterality: N/A;   COLONOSCOPY  05/18/2015   repeat 5 years   COLONOSCOPY WITH PROPOFOL N/A 07/01/2020   Procedure: COLONOSCOPY WITH PROPOFOL;  Surgeon: [name], MD;  Location: [facility] ENDOSCOPY;  Service: Gastroenterology;  Laterality: N/A;   ESOPHAGOGASTRODUODENOSCOPY  05/18/2015   KNEE ARTHROSCOPY  2001 and 2009   TONSILLECTOMY     TOTAL ABDOMINAL HYSTERECTOMY  22 years ago    Prior to Admission medications   Medication Sig Start Date End Date Taking? Authorizing Provider  Ascorbic Acid (CHEWABLE VITAMIN C PO) Take by mouth daily.    [provider]  aspirin EC 81 MG tablet Take 81 mg by mouth daily.    [provider]  blood glucose meter kit and supplies KIT Dispense based on patient and insurance preference. Use up to four times daily as directed. (FOR ICD-9 250.00, 250.01). 10/24/16   Harvest Dark, MD  dicyclomine (BENTYL) 10 MG capsule Take 1 capsule (10 mg total) by mouth 4 (four) times daily -  before meals and at bedtime. 02/01/22   Johnson, Megan P, DO  Dulaglutide (TRULICITY) 3 WG/6.6ZL SOPN INJECT 3 MG UNDER THE SKIN ONCE A WEEK 02/01/22   Johnson, Megan P, DO  empagliflozin (JARDIANCE) 25 MG TABS tablet TAKE 1 TABLET(25 MG) BY MOUTH  DAILY 02/01/22   Johnson, Megan P, DO  EPINEPHrine 0.3 mg/0.3 mL IJ SOAJ injection INJECT INTRAMUSCULARLY AS DIRECTED 02/01/22   Wynetta Emery, Megan P, DO  escitalopram (LEXAPRO) 20 MG tablet Take 1 tablet (20 mg total) by mouth daily. 02/01/22   Johnson, Megan P, DO  fexofenadine (ALLEGRA) 180 MG tablet Take 180 mg by mouth daily.    [provider]  LORazepam (ATIVAN) 0.5 MG tablet Take 1 tablet (0.5 mg total) by mouth 2 (two) times daily as needed for anxiety. 02/01/22   Johnson, Megan P, DO  Multiple Vitamins-Minerals (OCUVITE ADULT 50+ PO) Take 1 capsule by mouth daily.    [provider]  Multiple Vitamins-Minerals (ZINC PO) Take by mouth daily.    [provider]  omeprazole  (PRILOSEC) 20 MG capsule Take 1 capsule (20 mg total) by mouth daily. 02/01/22   Johnson, Megan P, DO  simvastatin (ZOCOR) 40 MG tablet Take 1 tablet (40 mg total) by mouth daily at 6 PM. 02/01/22   Johnson, Megan P, DO  UNABLE TO FIND Take by mouth daily. Vitamin D3, vitamin B, Bioflex    [provider]  UNABLE TO FIND Take by mouth daily. Ocutive eye vitamins, cinnamon vitamins    [provider]    Family History  Problem Relation Age of Onset   Colon polyps Father    Diabetes Father    Hypertension Father    Heart disease Father    Stroke Father    Diabetes Mother    Hypertension Mother    Diabetes Brother    Heart failure Brother        pacemaker   Hypertension Brother    Heart attack Brother    Migraines Daughter    Lupus Son    Diabetes Maternal Grandmother    Heart disease Maternal Grandmother    Stroke Maternal Grandfather    Hypertension Paternal Grandmother    Stroke Paternal Grandfather    Hyperlipidemia Brother    Breast cancer Neg Hx      Social History   Tobacco Use   Smoking status: Never   Smokeless tobacco: Never  Vaping Use   Vaping Use: Never used  Substance Use Topics   Alcohol use: No   Drug use: No    Allergies as of 05/25/2022 - Review Complete 05/04/2022  Allergen Reaction Noted   Carafate [sucralfate] Anaphylaxis and Hives 06/26/2017   Atorvastatin Other (See Comments) 04/01/2014   Metformin and related Diarrhea 04/06/2015   Other Hives and Other (See Comments) 06/11/2017   Penicillins Nausea And Vomiting 04/06/2015   Tramadol Nausea Only 04/01/2014    Review of Systems:    All systems reviewed and negative except where noted in HPI.   Physical Exam:  There were no vitals taken for this visit. No LMP recorded. Patient has had a hysterectomy. Psych:  Alert and cooperative. Normal mood and affect. General:   Alert,  Well-developed, well-nourished, pleasant and cooperative in NAD Head:  Normocephalic and  atraumatic. Eyes:  Sclera clear, no icterus.   Conjunctiva pink. Neurologic:  Alert and oriented x3;  grossly normal neurologically. Psych:  Alert and cooperative. Normal mood and affect.  Imaging Studies: No results found.  Assessment and Plan:   Brianna Leon is a 66 y.o. y/o female is here to see me for referral of IBS.  Her history does not fit a picture of irritable bowel syndrome due to the lack of abdominal pain preceding a bowel movement.  Her history  is most suggestive of outlet obstructive constipation mainly from possible bladder and rectal prolapse based on her history.  S/p hysterectomy.  Recent gynecological exam showed cystocele midline rectocele.  Plan  High fiber diet 25 grams per day -patient information provided Commence Linzess 145 mcg daily she will call me in a week to inform me if the samples have helped if yes we will give her a prescription if not we will increase the dose if it causes significant diarrhea will reduce the dose.  She has been given an option of a pessary and also surgical discussion has been commenced as an option for the future. 3.  I would recommend her to stop the dicyclomine as it may worsen the constipation due to its anticholinergic effect  Follow up in as needed  Dr [name] MD,MRCP(U.K)

## 2022-06-06 DIAGNOSIS — E119 Type 2 diabetes mellitus without complications: Secondary | ICD-10-CM | POA: Diagnosis not present

## 2022-07-02 DIAGNOSIS — B349 Viral infection, unspecified: Secondary | ICD-10-CM | POA: Diagnosis not present

## 2022-07-07 DIAGNOSIS — E119 Type 2 diabetes mellitus without complications: Secondary | ICD-10-CM | POA: Diagnosis not present

## 2022-07-09 DIAGNOSIS — J40 Bronchitis, not specified as acute or chronic: Secondary | ICD-10-CM | POA: Diagnosis not present

## 2022-07-09 DIAGNOSIS — R0981 Nasal congestion: Secondary | ICD-10-CM | POA: Diagnosis not present

## 2022-07-10 ENCOUNTER — Other Ambulatory Visit: Payer: Self-pay

## 2022-07-10 ENCOUNTER — Emergency Department: Payer: BC Managed Care – PPO

## 2022-07-10 ENCOUNTER — Encounter: Payer: Self-pay | Admitting: Intensive Care

## 2022-07-10 ENCOUNTER — Emergency Department
Admission: EM | Admit: 2022-07-10 | Discharge: 2022-07-10 | Disposition: A | Discharge status: Home / Self Care | Payer: BC Managed Care – PPO | Attending: Student in an Organized Health Care Education/Training Program | Admitting: Student in an Organized Health Care Education/Training Program

## 2022-07-10 DIAGNOSIS — W19XXXA Unspecified fall, initial encounter: Secondary | ICD-10-CM

## 2022-07-10 DIAGNOSIS — S2241XA Multiple fractures of ribs, right side, initial encounter for closed fracture: Secondary | ICD-10-CM | POA: Diagnosis not present

## 2022-07-10 DIAGNOSIS — I2510 Atherosclerotic heart disease of native coronary artery without angina pectoris: Secondary | ICD-10-CM | POA: Insufficient documentation

## 2022-07-10 DIAGNOSIS — E119 Type 2 diabetes mellitus without complications: Secondary | ICD-10-CM | POA: Insufficient documentation

## 2022-07-10 DIAGNOSIS — S0083XA Contusion of other part of head, initial encounter: Secondary | ICD-10-CM | POA: Insufficient documentation

## 2022-07-10 DIAGNOSIS — R0781 Pleurodynia: Secondary | ICD-10-CM | POA: Diagnosis not present

## 2022-07-10 DIAGNOSIS — Z7982 Long term (current) use of aspirin: Secondary | ICD-10-CM | POA: Insufficient documentation

## 2022-07-10 DIAGNOSIS — W01198A Fall on same level from slipping, tripping and stumbling with subsequent striking against other object, initial encounter: Secondary | ICD-10-CM | POA: Insufficient documentation

## 2022-07-10 DIAGNOSIS — R519 Headache, unspecified: Secondary | ICD-10-CM | POA: Diagnosis not present

## 2022-07-10 DIAGNOSIS — S0990XA Unspecified injury of head, initial encounter: Secondary | ICD-10-CM | POA: Diagnosis not present

## 2022-07-10 MED ORDER — HYDROCODONE-ACETAMINOPHEN 5-325 MG PO TABS: 1.0000 | ORAL_TABLET | Freq: Four times a day (QID) | ORAL | 0 refills | Status: AC | PRN

## 2022-07-10 MED ORDER — ONDANSETRON 4 MG PO TBDP: 4.0000 mg | ORAL_TABLET | Freq: Three times a day (TID) | ORAL | 0 refills | Status: AC | PRN

## 2022-07-10 NOTE — ED Triage Notes (Signed)
Patient c/o mechanical fall today. Denies LOC. Hematoma present on right forehead. Takes 81mg  aspirin daily.

## 2022-07-10 NOTE — ED Provider Notes (Signed)
[facility] Provider Note  Patient Contact: 3:47 PM (approximate)   History   Fall   HPI  Brianna Leon is a 66 y.o. female with a history of diabetes, GERD, hyperlipidemia, coronary artery disease and migraines, presents to the emergency department after patient had a mechanical fall.  Patient states that she slipped on a plastic runner that was on her carpet.  She has a right-sided forehead hematoma.  She denies loss of consciousness or neck pain.  She is complaining of some right lateral chest wall pain that is reproduced with deep inspiration.  She denies shortness of breath or chest tightness or deep chest pain.  No nausea, vomiting or abdominal pain.  She states that she has been able to bear weight easily since injury occurred.  She takes 81 mg of aspirin daily but no other blood thinners.      Physical Exam   Triage Vital Signs: ED Triage Vitals  Enc Vitals Group     BP 07/10/22 1423 132/77     Pulse Rate 07/10/22 1423 86     Resp 07/10/22 1423 18     Temp 07/10/22 1423 98.1 F (36.7 C)     Temp Source 07/10/22 1423 Oral     SpO2 07/10/22 1423 98 %     Weight 07/10/22 1437 129 lb (58.5 kg)     Height 07/10/22 1437 5\' 2"  (1.575 m)     Head Circumference --      Peak Flow --      Pain Score 07/10/22 1436 8     Pain Loc --      Pain Edu? --      Excl. in GC? --     Most recent vital signs: Vitals:   07/10/22 1423 07/10/22 1640  BP: 132/77 130/70  Pulse: 86 80  Resp: 18 18  Temp: 98.1 F (36.7 C)   SpO2: 98%      General: Alert and in no acute distress. Eyes:  PERRL. EOMI. Head: He has right-sided forehead hematoma with no lacerations. ENT:      Ears: No ecchymosis behind the ears bilaterally.      Nose: No congestion/rhinnorhea.      Mouth/Throat: Mucous membranes are moist.  Neck: No stridor. No cervical spine tenderness to palpation. Cardiovascular:  Good peripheral perfusion.  Patient has right lateral chest wall  tenderness to palpation. Respiratory: Normal respiratory effort without tachypnea or retractions. Lungs CTAB. Good air entry to the bases with no decreased or absent breath sounds. Gastrointestinal: Bowel sounds 4 quadrants. Soft and nontender to palpation. No guarding or rigidity. No palpable masses. No distention. No CVA tenderness. Musculoskeletal: Full range of motion to all extremities.  Neurologic:  No gross focal neurologic deficits are appreciated.  Skin:   No rash noted Other:   ED Results / Procedures / Treatments   Labs (all labs ordered are listed, but only abnormal results are displayed) Labs Reviewed - No data to display     RADIOLOGY  I personally viewed and evaluated these images as part of my medical decision making, as well as reviewing the written report by the radiologist.  ED Provider Interpretation: No evidence of pneumothorax or rib fracture.   PROCEDURES:  Critical Care performed: No  Procedures   MEDICATIONS ORDERED IN ED: Medications - No data to display   IMPRESSION / MDM / ASSESSMENT AND PLAN / ED COURSE  I reviewed the triage vital signs and the nursing notes.  Assessment and plan Fall 66 year old female presents to the emergency department after mechanical fall.  Vital signs are reassuring at triage.  On exam, patient was alert, active and nontoxic-appearing.  Patient had a 3 cm x 2 cm right-sided forehead hematoma without lacerations.  Patient's neuro exam was reassuring.  CT head shows no evidence of intracranial bleed or skull fracture.    Patient did have some right lateral chest wall tenderness to palpation.  We will obtain dedicated x-ray of the right ribs to rule out rib fracture/pneumothorax and will reassess.   X-ray of the right ribs unremarkable.  Patient was prescribed a short course of Norco for pain as well as Zofran if nausea occurs.  Return precautions were given to return with new or  worsening symptoms.  FINAL CLINICAL IMPRESSION(S) / ED DIAGNOSES   Final diagnoses:  Fall, initial encounter     Rx / DC Orders   ED Discharge Orders          Ordered    HYDROcodone-acetaminophen (NORCO) 5-325 MG tablet  Every 6 hours PRN        07/10/22 1637    ondansetron (ZOFRAN-ODT) 4 MG disintegrating tablet  Every 8 hours PRN        07/10/22 1637             Note:  This document was prepared using Dragon voice recognition software and may include unintentional dictation errors.   Pia Mau [place], PA-C 07/10/22 1644    Willy Eddy, MD 07/10/22 2056

## 2022-07-10 NOTE — Discharge Instructions (Signed)
You have been prescribed a short course of Norco for pain. Take stool softener with the Norco if it causes constipation. You can take Zofran with your Norco to avoid nausea.

## 2022-07-11 ENCOUNTER — Telehealth: Payer: Self-pay | Admitting: *Deleted

## 2022-07-11 NOTE — Telephone Encounter (Signed)
Transition Care Management Unsuccessful Follow-up Telephone Call  Date of discharge and from where:  [facility] 07-10-2022  Attempts:  1st Attempt  Reason for unsuccessful TCM follow-up call:  Left voice message

## 2022-08-01 ENCOUNTER — Telehealth: Payer: Self-pay

## 2022-08-01 NOTE — Patient Outreach (Signed)
  Care Coordination   08/01/2022 Name: Brianna Leon MRN: [id number] DOB: December 29, 1955   Care Coordination Outreach Attempts:  An unsuccessful telephone outreach was attempted today to offer the patient information about available care coordination services as a benefit of their health plan.   Follow Up Plan:  Additional outreach attempts will be made to offer the patient care coordination information and services.   Encounter Outcome:  No Answer  Care Coordination Interventions Activated:  No   Care Coordination Interventions:  No, not indicated    Noreene Larsson RN, MSN, [facility]  Mobile: [phone]

## 2022-08-04 ENCOUNTER — Ambulatory Visit: Payer: BC Managed Care – PPO | Admitting: Family Medicine

## 2022-08-04 ENCOUNTER — Encounter: Payer: Self-pay | Admitting: Family Medicine

## 2022-08-04 VITALS — BP 109/67 | HR 80 | Temp 98.1°F | Ht 62.0 in | Wt 129.5 lb

## 2022-08-04 DIAGNOSIS — E1129 Type 2 diabetes mellitus with other diabetic kidney complication: Secondary | ICD-10-CM

## 2022-08-04 DIAGNOSIS — E782 Mixed hyperlipidemia: Secondary | ICD-10-CM | POA: Diagnosis not present

## 2022-08-04 DIAGNOSIS — R809 Proteinuria, unspecified: Secondary | ICD-10-CM

## 2022-08-04 DIAGNOSIS — K219 Gastro-esophageal reflux disease without esophagitis: Secondary | ICD-10-CM | POA: Diagnosis not present

## 2022-08-04 DIAGNOSIS — I129 Hypertensive chronic kidney disease with stage 1 through stage 4 chronic kidney disease, or unspecified chronic kidney disease: Secondary | ICD-10-CM

## 2022-08-04 DIAGNOSIS — F419 Anxiety disorder, unspecified: Secondary | ICD-10-CM | POA: Diagnosis not present

## 2022-08-04 DIAGNOSIS — Z23 Encounter for immunization: Secondary | ICD-10-CM | POA: Diagnosis not present

## 2022-08-04 LAB — MICROALBUMIN, URINE WAIVED
Creatinine, Urine Waived: 50 mg/dL (ref 10–300)
Microalb, Ur Waived: 10 mg/L (ref 0–19)

## 2022-08-04 LAB — BAYER DCA HB A1C WAIVED: HB A1C (BAYER DCA - WAIVED): 7.7 % — ABNORMAL HIGH (ref 4.8–5.6)

## 2022-08-04 MED ORDER — OMEPRAZOLE 20 MG PO CPDR: 20.0000 mg | DELAYED_RELEASE_CAPSULE | Freq: Every day | ORAL | 3 refills | Status: DC

## 2022-08-04 MED ORDER — DICYCLOMINE HCL 10 MG PO CAPS: 10.0000 mg | ORAL_CAPSULE | Freq: Three times a day (TID) | ORAL | 3 refills | Status: DC

## 2022-08-04 MED ORDER — SIMVASTATIN 40 MG PO TABS: 40.0000 mg | ORAL_TABLET | Freq: Every day | ORAL | 1 refills | Status: DC

## 2022-08-04 MED ORDER — TRULICITY 3 MG/0.5ML ~~LOC~~ SOAJ: SUBCUTANEOUS | 1 refills | Status: DC

## 2022-08-04 MED ORDER — LORAZEPAM 0.5 MG PO TABS
0.5000 mg | ORAL_TABLET | Freq: Two times a day (BID) | ORAL | 1 refills | Status: DC | PRN
Start: 2022-08-04 — End: 2022-11-03

## 2022-08-04 MED ORDER — EMPAGLIFLOZIN 25 MG PO TABS: ORAL_TABLET | ORAL | 1 refills | Status: DC

## 2022-08-04 MED ORDER — ESCITALOPRAM OXALATE 20 MG PO TABS: 20.0000 mg | ORAL_TABLET | Freq: Every day | ORAL | 1 refills | Status: DC

## 2022-08-04 NOTE — Progress Notes (Signed)
BP 109/67   Pulse 80   Temp 98.1 F (36.7 C) (Oral)   Ht 5\' 2"  (1.575 m)   Wt 129 lb 8 oz (58.7 kg)   SpO2 97%   BMI 23.69 kg/m    Subjective:    Patient ID: , female    DOB: 09-Mar-1956, 66 y.o.   MRN: [id number]  HPI: Brianna Leon is a 66 y.o. female  Chief Complaint  Patient presents with   Diabetes   Gastroesophageal Reflux   Anxiety   Hyperlipidemia   Finger Injury    Patient says she would like for the provider to take a look at her R thumb as she cut it and says every time she takes off the band-aid she does noticed any healing.    DIABETES Hypoglycemic episodes:no Polydipsia/polyuria: no Visual disturbance: no Chest pain: no Paresthesias: no Glucose Monitoring: no  Accucheck frequency: Not Checking  Fasting glucose:  Post prandial:  Evening:  Before meals: Taking Insulin?: no  Long acting insulin:  Short acting insulin: Blood Pressure Monitoring: not checking Retinal Examination: Up to Date Foot Exam: Up to Date Diabetic Education: Not Completed Pneumovax: Up to Date Influenza: Up to Date Aspirin: yes  HYPERTENSION / HYPERLIPIDEMIA Satisfied with current treatment? yes Duration of hypertension: chronic BP monitoring frequency: not checking BP medication side effects: not on anything Duration of hyperlipidemia: chronic Cholesterol medication side effects: no Cholesterol supplements: none Past cholesterol medications: simvastatin Medication compliance: excellent compliance Aspirin: yes Recent stressors: no Recurrent headaches: no Visual changes: no Palpitations: no Dyspnea: no Chest pain: no Lower extremity edema: no Dizzy/lightheaded: no  ANXIETY/DEPRESSION Duration: chronic Status:controlled Anxious mood: no  Excessive worrying: no Irritability: no  Sweating: no Nausea: no Palpitations:no Hyperventilation: no Panic attacks: no Agoraphobia: no  Obscessions/compulsions: no Depressed mood: no    08/04/2022     3:41 PM 05/04/2022   11:12 AM 02/01/2022   11:18 AM 02/11/2021    3:07 PM 04/14/2020   11:24 AM  Depression screen PHQ 2/9  Decreased Interest 0 0 0 0 0  Down, Depressed, Hopeless 0 0 0 0 2  PHQ - 2 Score 0 0 0 0 2  Altered sleeping 0 0 0  2  Tired, decreased energy 0 0 0  2  Change in appetite 0 0 0  1  Feeling bad or failure about yourself  0 0 0  0  Trouble concentrating 0 0 0  0  Moving slowly or fidgety/restless 0 0 0  0  Suicidal thoughts 0 0 0  0  PHQ-9 Score 0 0 0  7  Difficult doing work/chores Not difficult at all Not difficult at all      Anhedonia: no Weight changes: no Insomnia: no   Hypersomnia: no Fatigue/loss of energy: no Feelings of worthlessness: no Feelings of guilt: no Impaired concentration/indecisiveness: no Suicidal ideations: no  Crying spells: no Recent Stressors/Life Changes: no   Relationship problems: no   Family stress: no     Financial stress: no    Job stress: no    Recent death/loss: no  Relevant past medical, surgical, family and social history reviewed and updated as indicated. Interim medical history since our last visit reviewed. Allergies and medications reviewed and updated.  Review of Systems  Constitutional: Negative.   Respiratory: Negative.    Cardiovascular: Negative.   Gastrointestinal: Negative.   Genitourinary: Negative.   Musculoskeletal: Negative.   Neurological: Negative.   Psychiatric/Behavioral: Negative.  Per HPI unless specifically indicated above     Objective:    BP 109/67   Pulse 80   Temp 98.1 F (36.7 C) (Oral)   Ht 5\' 2"  (1.575 m)   Wt 129 lb 8 oz (58.7 kg)   SpO2 97%   BMI 23.69 kg/m   Wt Readings from Last 3 Encounters:  08/04/22 129 lb 8 oz (58.7 kg)  07/10/22 129 lb (58.5 kg)  05/25/22 131 lb 6.4 oz (59.6 kg)    Physical Exam Vitals and nursing note reviewed.  Constitutional:      General: She is not in acute distress.    Appearance: Normal appearance. She is normal weight. She is  not ill-appearing, toxic-appearing or diaphoretic.  HENT:     Head: Normocephalic and atraumatic.     Right Ear: External ear normal.     Left Ear: External ear normal.     Nose: Nose normal.     Mouth/Throat:     Mouth: Mucous membranes are moist.     Pharynx: Oropharynx is clear.  Eyes:     General: No scleral icterus.       Right eye: No discharge.        Left eye: No discharge.     Extraocular Movements: Extraocular movements intact.     Conjunctiva/sclera: Conjunctivae normal.     Pupils: Pupils are equal, round, and reactive to light.  Cardiovascular:     Rate and Rhythm: Normal rate and regular rhythm.     Pulses: Normal pulses.     Heart sounds: Normal heart sounds. No murmur heard.    No friction rub. No gallop.  Pulmonary:     Effort: Pulmonary effort is normal. No respiratory distress.     Breath sounds: Normal breath sounds. No stridor. No wheezing, rhonchi or rales.  Chest:     Chest wall: No tenderness.  Musculoskeletal:        General: Normal range of motion.     Cervical back: Normal range of motion and neck supple.  Skin:    General: Skin is warm and dry.     Capillary Refill: Capillary refill takes less than 2 seconds.     Coloration: Skin is not jaundiced or pale.     Findings: No bruising, erythema, lesion or rash.     Comments: Laceration on R thumb, healing well. Not bleeding  Neurological:     General: No focal deficit present.     Mental Status: She is alert and oriented to person, place, and time. Mental status is at baseline.  Psychiatric:        Mood and Affect: Mood normal.        Behavior: Behavior normal.        Thought Content: Thought content normal.        Judgment: Judgment normal.     Results for orders placed or performed in visit on 05/29/22  HM DIABETES EYE EXAM  Result Value Ref Range   HM Diabetic Eye Exam No Retinopathy No Retinopathy      Assessment & Plan:   Problem List Items Addressed This Visit       Digestive    GERD (gastroesophageal reflux disease)    Under good control on current regimen. Continue current regimen. Continue to monitor. Call with any concerns. Refills given. Labs drawn today.       Relevant Orders   Comprehensive metabolic panel   CBC with Differential/Platelet     Endocrine  DM (diabetes mellitus), type 2 ([facility])    A1c up slightly at 7.7 up from 7.3. Will really work on diet and exercise and will recheck in 3 months. If up again, will increase trulicity.      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 Continue current regimen. Continue to monitor. Call with any concerns. Refills given. Labs drawn today.      Relevant Orders   Comprehensive metabolic panel   CBC with Differential/Platelet   Lipid Panel w/o Chol/HDL Ratio   Anxiety    Under good control on current regimen. Continue current regimen. Continue to monitor. Call with any concerns. Refills given for 3 months. Follow up 3 months.      Relevant Orders   Comprehensive metabolic panel   CBC with Differential/Platelet   Other Visit Diagnoses     Needs flu shot       Relevant Orders   Flu Vaccine QUAD 6+ mos PF IM (Fluarix Quad PF)        Follow up plan: Return in about 3 months (around 11/03/2022).

## 2022-08-04 NOTE — Assessment & Plan Note (Signed)
Under good control on current regimen. Continue current regimen. Continue to monitor. Call with any concerns. Refills given for 3 months. Follow up 3 months.    

## 2022-08-04 NOTE — Assessment & Plan Note (Signed)
Under good control on current regimen. Continue current regimen. Continue to monitor. Call with any concerns. Refills given. Labs drawn today.   

## 2022-08-04 NOTE — Assessment & Plan Note (Signed)
A1c up slightly at 7.7 up from 7.3. Will really work on diet and exercise and will recheck in 3 months. If up again, will increase trulicity.

## 2022-08-05 LAB — CBC WITH DIFFERENTIAL/PLATELET
Basophils Absolute: 0 10*3/uL (ref 0.0–0.2)
Basos: 1 %
EOS (ABSOLUTE): 0.2 10*3/uL (ref 0.0–0.4)
Eos: 2 %
Hematocrit: 41.3 % (ref 34.0–46.6)
Hemoglobin: 13.5 g/dL (ref 11.1–15.9)
Immature Grans (Abs): 0 10*3/uL (ref 0.0–0.1)
Immature Granulocytes: 0 %
Lymphocytes Absolute: 2.6 10*3/uL (ref 0.7–3.1)
Lymphs: 30 %
MCH: 31.6 pg (ref 26.6–33.0)
MCHC: 32.7 g/dL (ref 31.5–35.7)
MCV: 97 fL (ref 79–97)
Monocytes Absolute: 0.6 10*3/uL (ref 0.1–0.9)
Monocytes: 7 %
Neutrophils Absolute: 5.3 10*3/uL (ref 1.4–7.0)
Neutrophils: 60 %
Platelets: 199 10*3/uL (ref 150–450)
RBC: 4.27 x10E6/uL (ref 3.77–5.28)
RDW: 11.4 % — ABNORMAL LOW (ref 11.7–15.4)
WBC: 8.7 10*3/uL (ref 3.4–10.8)

## 2022-08-05 LAB — COMPREHENSIVE METABOLIC PANEL
ALT: 15 IU/L (ref 0–32)
AST: 20 IU/L (ref 0–40)
Albumin/Globulin Ratio: 1.7 (ref 1.2–2.2)
Albumin: 4.7 g/dL (ref 3.9–4.9)
Alkaline Phosphatase: 79 IU/L (ref 44–121)
BUN/Creatinine Ratio: 18 (ref 12–28)
BUN: 12 mg/dL (ref 8–27)
Bilirubin Total: 0.4 mg/dL (ref 0.0–1.2)
CO2: 25 mmol/L (ref 20–29)
Calcium: 9.7 mg/dL (ref 8.7–10.3)
Chloride: 101 mmol/L (ref 96–106)
Creatinine, Ser: 0.66 mg/dL (ref 0.57–1.00)
Globulin, Total: 2.7 g/dL (ref 1.5–4.5)
Glucose: 233 mg/dL — ABNORMAL HIGH (ref 70–99)
Potassium: 3.7 mmol/L (ref 3.5–5.2)
Sodium: 143 mmol/L (ref 134–144)
Total Protein: 7.4 g/dL (ref 6.0–8.5)
eGFR: 98 mL/min/{1.73_m2} (ref >59)

## 2022-08-05 LAB — LIPID PANEL W/O CHOL/HDL RATIO
Cholesterol, Total: 153 mg/dL (ref 100–199)
HDL: 42 mg/dL (ref >39)
LDL Chol Calc (NIH): 81 mg/dL (ref 0–99)
Triglycerides: 176 mg/dL — ABNORMAL HIGH (ref 0–149)
VLDL Cholesterol Cal: 30 mg/dL (ref 5–40)

## 2022-08-06 DIAGNOSIS — E119 Type 2 diabetes mellitus without complications: Secondary | ICD-10-CM | POA: Diagnosis not present

## 2022-08-16 ENCOUNTER — Other Ambulatory Visit: Payer: Self-pay | Admitting: Family Medicine

## 2022-08-16 DIAGNOSIS — Z23 Encounter for immunization: Secondary | ICD-10-CM

## 2022-08-21 ENCOUNTER — Ambulatory Visit (INDEPENDENT_AMBULATORY_CARE_PROVIDER_SITE_OTHER): Payer: BC Managed Care – PPO

## 2022-08-21 DIAGNOSIS — Z23 Encounter for immunization: Secondary | ICD-10-CM

## 2022-08-21 NOTE — Progress Notes (Signed)
Patient presents today for 2nd Shingrix vaccination, patient received in [place]  deltoid, patient tolerated well.

## 2022-09-06 DIAGNOSIS — E119 Type 2 diabetes mellitus without complications: Secondary | ICD-10-CM | POA: Diagnosis not present

## 2022-10-06 DIAGNOSIS — E119 Type 2 diabetes mellitus without complications: Secondary | ICD-10-CM | POA: Diagnosis not present

## 2022-11-03 ENCOUNTER — Ambulatory Visit: Payer: BC Managed Care – PPO | Admitting: Family Medicine

## 2022-11-03 ENCOUNTER — Encounter: Payer: Self-pay | Admitting: Family Medicine

## 2022-11-03 VITALS — BP 114/72 | HR 78 | Temp 97.9°F | Ht 62.0 in | Wt 125.7 lb

## 2022-11-03 DIAGNOSIS — E1129 Type 2 diabetes mellitus with other diabetic kidney complication: Secondary | ICD-10-CM

## 2022-11-03 DIAGNOSIS — F419 Anxiety disorder, unspecified: Secondary | ICD-10-CM

## 2022-11-03 DIAGNOSIS — R809 Proteinuria, unspecified: Secondary | ICD-10-CM | POA: Diagnosis not present

## 2022-11-03 LAB — BAYER DCA HB A1C WAIVED: HB A1C (BAYER DCA - WAIVED): 7.6 % — ABNORMAL HIGH (ref 4.8–5.6)

## 2022-11-03 MED ORDER — LORAZEPAM 0.5 MG PO TABS
0.5000 mg | ORAL_TABLET | Freq: Two times a day (BID) | ORAL | 1 refills | Status: DC | PRN
Start: 2022-11-03 — End: 2023-02-02

## 2022-11-03 NOTE — Assessment & Plan Note (Signed)
Under good control on current regimen. Continue current regimen. Continue to monitor. Call with any concerns. Refills given for 3 months. Follow up 3 months.    

## 2022-11-03 NOTE — Progress Notes (Signed)
BP 114/72   Pulse 78   Temp 97.9 F (36.6 C) (Oral)   Ht _0  (1.575 m)   Wt 125 lb 11.2 oz (57 kg)   SpO2 98%   BMI 22.99 kg/m    Subjective:    Patient ID: Brianna Leon, female    DOB: 1956/03/24, 66 y.o.   MRN: [id number]  HPI: Brianna Leon is a 66 y.o. female  Chief Complaint  Patient presents with   Diabetes   Anxiety   DIABETES Hypoglycemic episodes:no Polydipsia/polyuria: no Visual disturbance: no Chest pain: no Paresthesias: no Glucose Monitoring: yes  Accucheck frequency: occasionally- 140-150 Taking Insulin?: no Blood Pressure Monitoring: not checking Retinal Examination: Up to Date Foot Exam: Up to Date Diabetic Education: Completed Pneumovax: Up to Date Influenza: Up to Date Aspirin: no  ANXIETY/STRESS Duration: chronic Status:stable Anxious mood: yes  Excessive worrying: no Irritability: no  Sweating: no Nausea: no Palpitations:no Hyperventilation: no Panic attacks: no Agoraphobia: no  Obscessions/compulsions: no Depressed mood: no    11/03/2022    2:37 PM 08/04/2022    3:41 PM 05/04/2022   11:12 AM 02/01/2022   11:18 AM 02/11/2021    3:07 PM  Depression screen PHQ 2/9  Decreased Interest 0 0 0 0 0  Down, Depressed, Hopeless 0 0 0 0 0  PHQ - 2 Score 0 0 0 0 0  Altered sleeping 3 0 0 0   Tired, decreased energy 3 0 0 0   Change in appetite 0 0 0 0   Feeling bad or failure about yourself  0 0 0 0   Trouble concentrating 0 0 0 0   Moving slowly or fidgety/restless 0 0 0 0   Suicidal thoughts 0 0 0 0   PHQ-9 Score 6 0 0 0   Difficult doing work/chores Not difficult at all Not difficult at all Not difficult at all     Anhedonia: no Weight changes: no Insomnia: no   Hypersomnia: yes Fatigue/loss of energy: yes Feelings of worthlessness: no Feelings of guilt: no Impaired concentration/indecisiveness: no Suicidal ideations: no  Crying spells: no Recent Stressors/Life Changes: no   Relationship problems: no   Family stress:  no     Financial stress: no    Job stress: no    Recent death/loss: no   Relevant past medical, surgical, family and social history reviewed and updated as indicated. Interim medical history since our last visit reviewed. Allergies and medications reviewed and updated.  Review of Systems  Constitutional: Negative.   Respiratory: Negative.    Cardiovascular: Negative.   Gastrointestinal: Negative.   Musculoskeletal: Negative.   Neurological: Negative.   Psychiatric/Behavioral: Negative.      Per HPI unless specifically indicated above     Objective:    BP 114/72   Pulse 78   Temp 97.9 F (36.6 C) (Oral)   Ht _1  (1.575 m)   Wt 125 lb 11.2 oz (57 kg)   SpO2 98%   BMI 22.99 kg/m   Wt Readings from Last 3 Encounters:  11/03/22 125 lb 11.2 oz (57 kg)  08/04/22 129 lb 8 oz (58.7 kg)  07/10/22 129 lb (58.5 kg)    Physical Exam Vitals and nursing note reviewed.  Constitutional:      General: She is not in acute distress.    Appearance: Normal appearance. She is not ill-appearing, toxic-appearing or diaphoretic.  HENT:     Head: Normocephalic and atraumatic.     Right Ear: External  ear normal.     Left Ear: External ear normal.     Nose: Nose normal.     Mouth/Throat:     Mouth: Mucous membranes are moist.     Pharynx: Oropharynx is clear.  Eyes:     General: No scleral icterus.       Right eye: No discharge.        Left eye: No discharge.     Extraocular Movements: Extraocular movements intact.     Conjunctiva/sclera: Conjunctivae normal.     Pupils: Pupils are equal, round, and reactive to light.  Cardiovascular:     Rate and Rhythm: Normal rate and regular rhythm.     Pulses: Normal pulses.     Heart sounds: Normal heart sounds. No murmur heard.    No friction rub. No gallop.  Pulmonary:     Effort: Pulmonary effort is normal. No respiratory distress.     Breath sounds: Normal breath sounds. No stridor. No wheezing, rhonchi or rales.  Chest:     Chest  wall: No tenderness.  Musculoskeletal:        General: Normal range of motion.     Cervical back: Normal range of motion and neck supple.  Skin:    General: Skin is warm and dry.     Capillary Refill: Capillary refill takes less than 2 seconds.     Coloration: Skin is not jaundiced or pale.     Findings: No bruising, erythema, lesion or rash.  Neurological:     General: No focal deficit present.     Mental Status: She is alert and oriented to person, place, and time. Mental status is at baseline.  Psychiatric:        Mood and Affect: Mood normal.        Behavior: Behavior normal.        Thought Content: Thought content normal.        Judgment: Judgment normal.     Results for orders placed or performed in visit on 08/04/22  Bayer DCA Hb A1c Waived  Result Value Ref Range   HB A1C (BAYER DCA - WAIVED) 7.7 (H) 4.8 - 5.6 %  Comprehensive metabolic panel  Result Value Ref Range   Glucose 233 (H) 70 - 99 mg/dL   BUN 12 8 - 27 mg/dL   Creatinine, Ser 0.66 0.57 - 1.00 mg/dL   eGFR 98 >59 mL/min/1.73   BUN/Creatinine Ratio 18 12 - 28   Sodium 143 134 - 144 mmol/L   Potassium 3.7 3.5 - 5.2 mmol/L   Chloride 101 96 - 106 mmol/L   CO2 25 20 - 29 mmol/L   Calcium 9.7 8.7 - 10.3 mg/dL   Total Protein 7.4 6.0 - 8.5 g/dL   Albumin 4.7 3.9 - 4.9 g/dL   Globulin, Total 2.7 1.5 - 4.5 g/dL   Albumin/Globulin Ratio 1.7 1.2 - 2.2   Bilirubin Total 0.4 0.0 - 1.2 mg/dL   Alkaline Phosphatase 79 44 - 121 IU/L   AST 20 0 - 40 IU/L   ALT 15 0 - 32 IU/L  CBC with Differential/Platelet  Result Value Ref Range   WBC 8.7 3.4 - 10.8 x10E3/uL   RBC 4.27 3.77 - 5.28 x10E6/uL   Hemoglobin 13.5 11.1 - 15.9 g/dL   Hematocrit 41.3 34.0 - 46.6 %   MCV 97 79 - 97 fL   MCH 31.6 26.6 - 33.0 pg   MCHC 32.7 31.5 - 35.7 g/dL   RDW 11.4 (L) 11.7 -  15.4 %   Platelets 199 150 - 450 x10E3/uL   Neutrophils 60 Not Estab. %   Lymphs 30 Not Estab. %   Monocytes 7 Not Estab. %   Eos 2 Not Estab. %   Basos 1  Not Estab. %   Neutrophils Absolute 5.3 1.4 - 7.0 x10E3/uL   Lymphocytes Absolute 2.6 0.7 - 3.1 x10E3/uL   Monocytes Absolute 0.6 0.1 - 0.9 x10E3/uL   EOS (ABSOLUTE) 0.2 0.0 - 0.4 x10E3/uL   Basophils Absolute 0.0 0.0 - 0.2 x10E3/uL   Immature Granulocytes 0 Not Estab. %   Immature Grans (Abs) 0.0 0.0 - 0.1 x10E3/uL  Lipid Panel w/o Chol/HDL Ratio  Result Value Ref Range   Cholesterol, Total 153 100 - 199 mg/dL   Triglycerides 176 (H) 0 - 149 mg/dL   HDL 42 >39 mg/dL   VLDL Cholesterol Cal 30 5 - 40 mg/dL   LDL Chol Calc (NIH) 81 0 - 99 mg/dL  Microalbumin, Urine Waived  Result Value Ref Range   Microalb, Ur Waived 10 0 - 19 mg/L   Creatinine, Urine Waived 50 10 - 300 mg/dL   Microalb/Creat Ratio 30-300 (H) <30 mg/g      Assessment & Plan:   Problem List Items Addressed This Visit       Endocrine   DM (diabetes mellitus), type 2 ([place]) - Primary    Stable with A1c of 7.6. Continue current regimen. Continue to monitor. Call with any concerns.       Relevant Orders   Bayer DCA Hb A1c Waived     Other   Anxiety    Under good control on current regimen. Continue current regimen. Continue to monitor. Call with any concerns. Refills given for 3 months. Follow up 3 months.        Relevant Medications   LORazepam (ATIVAN) 0.5 MG tablet     Follow up plan: Return in about 3 months (around 02/02/2023) for welcome to medicare.

## 2022-11-03 NOTE — Assessment & Plan Note (Signed)
Stable with A1c of 7.6. Continue current regimen. Continue to monitor. Call with any concerns.

## 2023-01-26 ENCOUNTER — Other Ambulatory Visit: Payer: Self-pay | Admitting: Family Medicine

## 2023-01-26 NOTE — Telephone Encounter (Signed)
Requested Prescriptions  Pending Prescriptions Disp Refills   escitalopram (LEXAPRO) 20 MG tablet [Pharmacy Med Name: ESCITALOPRAM 20MG  TABLETS] 90 tablet 1    Sig: TAKE 1 TABLET(20 MG) BY MOUTH DAILY     Psychiatry:  Antidepressants - SSRI Passed - 01/26/2023  6:22 AM      Passed - Valid encounter within last 6 months    Recent Outpatient Visits           2 months ago Type 2 diabetes mellitus with microalbuminuria, without long-term current use of insulin ([place])   [place], Megan P, DO   5 months ago Benign hypertensive renal disease    [facility] [place], Megan P, DO   8 months ago Type 2 diabetes mellitus with microalbuminuria, without long-term current use of insulin ([place])   [place], Megan P, DO   11 months ago Type 2 diabetes mellitus with microalbuminuria, without long-term current use of insulin ([place])   [place], Megan P, DO   1 year ago Type 2 diabetes mellitus with microalbuminuria, without long-term current use of insulin ([place])   [place], Corazin, DO       Future Appointments             In 1 week [place], Barb Merino, DO [place], [facility]             JARDIANCE 25 MG TABS tablet [Pharmacy Med Name: JARDIANCE 25MG  TABLETS] 90 tablet 1    Sig: TAKE 1 TABLET(25 MG) BY MOUTH DAILY     Endocrinology:  Diabetes - SGLT2 Inhibitors Passed - 01/26/2023  6:22 AM      Passed - Cr in normal range and within 360 days    Creatinine  Date Value Ref Range Status  07/09/2014 0.94 0.60 - 1.30 mg/dL Final   Creatinine, Ser  Date Value Ref Range Status  08/04/2022 0.66 0.57 - 1.00 mg/dL Final         Passed - HBA1C is between 0 and 7.9 and within 180 days    Hemoglobin A1C  Date Value Ref Range Status  04/03/2017 per CE  Final  04/03/2017 9.4  Final   HB A1C (BAYER DCA - WAIVED)  Date  Value Ref Range Status  11/03/2022 7.6 (H) 4.8 - 5.6 % Final    Comment:             Prediabetes: 5.7 - 6.4          Diabetes: >6.4          Glycemic control for adults with diabetes: <7.0          Passed - eGFR in normal range and within 360 days    EGFR (African American)  Date Value Ref Range Status  07/09/2014 >60  Final   GFR calc Af Amer  Date Value Ref Range Status  11/12/2020 114 >59 mL/min/1.73 Final    Comment:    **In accordance with recommendations from the NKF-ASN Task force,**   [facility] is in the process of updating its eGFR calculation to the   2021 CKD-EPI creatinine equation that estimates kidney function   without a race variable.    EGFR (Non-African Amer.)  Date Value Ref Range Status  07/09/2014 >60  Final    Comment:    eGFR values <68mL/min/1.73 m2 may be an indication of chronic kidney disease (CKD). Calculated  eGFR is useful in patients with stable renal function. The eGFR calculation will not be reliable in acutely ill patients when serum creatinine is changing rapidly. It is not useful in  patients on dialysis. The eGFR calculation may not be applicable to patients at the low and high extremes of body sizes, pregnant women, and vegetarians.    GFR calc non Af Amer  Date Value Ref Range Status  11/12/2020 99 >59 mL/min/1.73 Final   eGFR  Date Value Ref Range Status  08/04/2022 98 >59 mL/min/1.73 Final         Passed - Valid encounter within last 6 months    Recent Outpatient Visits           2 months ago Type 2 diabetes mellitus with microalbuminuria, without long-term current use of insulin ([place])   [place], Megan P, DO   5 months ago Benign hypertensive renal disease   [place] [facility] [address], Megan P, DO   8 months ago Type 2 diabetes mellitus with microalbuminuria, without long-term current use of insulin ([place])   [place], Megan P, DO   11  months ago Type 2 diabetes mellitus with microalbuminuria, without long-term current use of insulin ([place])   [place], Megan P, DO   1 year ago Type 2 diabetes mellitus with microalbuminuria, without long-term current use of insulin ([place])   [place], [place], DO       Future Appointments             In 1 week Wyn[place] Emery, Barb Merino, DO [place], [facility]             simvastatin (ZOCOR) 40 MG tablet [Pharmacy Med Name: SIMVASTATIN 40MG  TABLETS] 90 tablet 1    Sig: TAKE 1 TABLET(40 MG) BY MOUTH DAILY AT 6 PM     Cardiovascular:  Antilipid - Statins Failed - 01/26/2023  6:22 AM      Failed - Lipid Panel in normal range within the last 12 months    Cholesterol, Total  Date Value Ref Range Status  08/04/2022 153 100 - 199 mg/dL Final   Cholesterol  Date Value Ref Range Status  08/08/2012 97 0 - 200 mg/dL Final   Ldl Cholesterol, Calc  Date Value Ref Range Status  08/08/2012 50 0 - 100 mg/dL Final   LDL Chol Calc (NIH)  Date Value Ref Range Status  08/04/2022 81 0 - 99 mg/dL Final   HDL Cholesterol  Date Value Ref Range Status  08/08/2012 27 (L) 40 - 60 mg/dL Final   HDL  Date Value Ref Range Status  08/04/2022 42 >39 mg/dL Final   Triglycerides  Date Value Ref Range Status  08/04/2022 176 (H) 0 - 149 mg/dL Final  08/08/2012 98 0 - 200 mg/dL Final         Passed - Patient is not pregnant      Passed - Valid encounter within last 12 months    Recent Outpatient Visits           2 months ago Type 2 diabetes mellitus with microalbuminuria, without long-term current use of insulin ([place])   [place], Megan P, DO   5 months ago Benign hypertensive renal disease   [place] [facility] [place], Megan P, DO   8 months ago Type 2 diabetes mellitus with microalbuminuria,  without long-term current use of insulin ([place])   [place], [place] P, DO   11 months ago Type 2 diabetes mellitus with microalbuminuria, without long-term current use of insulin [facility])   [place], [place] P, DO   1 year ago Type 2 diabetes mellitus with microalbuminuria, without long-term current use of insulin [facility])   [place], Barb Merino, DO       Future Appointments             In 1 week Wynetta Emery, Barb Merino, DO [place], [facility]

## 2023-02-02 ENCOUNTER — Ambulatory Visit (INDEPENDENT_AMBULATORY_CARE_PROVIDER_SITE_OTHER): Payer: Medicare Other | Admitting: Family Medicine

## 2023-02-02 ENCOUNTER — Encounter: Payer: Self-pay | Admitting: Family Medicine

## 2023-02-02 VITALS — BP 120/68 | HR 77 | Ht 62.25 in | Wt 125.4 lb

## 2023-02-02 DIAGNOSIS — Z0000 Encounter for general adult medical examination without abnormal findings: Secondary | ICD-10-CM | POA: Diagnosis not present

## 2023-02-02 DIAGNOSIS — R8281 Pyuria: Secondary | ICD-10-CM | POA: Diagnosis not present

## 2023-02-02 DIAGNOSIS — Z23 Encounter for immunization: Secondary | ICD-10-CM

## 2023-02-02 DIAGNOSIS — F419 Anxiety disorder, unspecified: Secondary | ICD-10-CM

## 2023-02-02 DIAGNOSIS — I2510 Atherosclerotic heart disease of native coronary artery without angina pectoris: Secondary | ICD-10-CM | POA: Diagnosis not present

## 2023-02-02 DIAGNOSIS — F5104 Psychophysiologic insomnia: Secondary | ICD-10-CM

## 2023-02-02 DIAGNOSIS — E1129 Type 2 diabetes mellitus with other diabetic kidney complication: Secondary | ICD-10-CM

## 2023-02-02 DIAGNOSIS — E782 Mixed hyperlipidemia: Secondary | ICD-10-CM | POA: Diagnosis not present

## 2023-02-02 DIAGNOSIS — Z136 Encounter for screening for cardiovascular disorders: Secondary | ICD-10-CM

## 2023-02-02 DIAGNOSIS — R809 Proteinuria, unspecified: Secondary | ICD-10-CM

## 2023-02-02 DIAGNOSIS — Z1231 Encounter for screening mammogram for malignant neoplasm of breast: Secondary | ICD-10-CM

## 2023-02-02 DIAGNOSIS — Z13820 Encounter for screening for osteoporosis: Secondary | ICD-10-CM

## 2023-02-02 LAB — MICROSCOPIC EXAMINATION

## 2023-02-02 LAB — URINALYSIS, ROUTINE W REFLEX MICROSCOPIC
Bilirubin, UA: NEGATIVE
Ketones, UA: NEGATIVE
Nitrite, UA: POSITIVE — AB
Protein,UA: NEGATIVE
Specific Gravity, UA: <1.005 — ABNORMAL LOW (ref 1.005–1.030)
Urobilinogen, Ur: 0.2 mg/dL (ref 0.2–1.0)
pH, UA: 5 (ref 5.0–7.5)

## 2023-02-02 LAB — MICROALBUMIN, URINE WAIVED
Creatinine, Urine Waived: 10 mg/dL (ref 10–300)
Microalb, Ur Waived: 30 mg/L — ABNORMAL HIGH (ref 0–19)

## 2023-02-02 LAB — BAYER DCA HB A1C WAIVED: HB A1C (BAYER DCA - WAIVED): 7.4 % — ABNORMAL HIGH (ref 4.8–5.6)

## 2023-02-02 MED ORDER — ESCITALOPRAM OXALATE 20 MG PO TABS: ORAL_TABLET | ORAL | 6 refills | Status: DC

## 2023-02-02 MED ORDER — EMPAGLIFLOZIN 25 MG PO TABS: ORAL_TABLET | ORAL | 6 refills | Status: DC

## 2023-02-02 MED ORDER — SIMVASTATIN 40 MG PO TABS
ORAL_TABLET | ORAL | 6 refills | Status: DC
Start: 2023-02-02 — End: 2023-04-19

## 2023-02-02 MED ORDER — LORAZEPAM 0.5 MG PO TABS: 0.5000 mg | ORAL_TABLET | Freq: Two times a day (BID) | ORAL | 1 refills | Status: DC | PRN

## 2023-02-02 MED ORDER — OMEPRAZOLE 20 MG PO CPDR: 20.0000 mg | DELAYED_RELEASE_CAPSULE | Freq: Every day | ORAL | 12 refills | Status: DC

## 2023-02-02 MED ORDER — TRULICITY 3 MG/0.5ML ~~LOC~~ SOAJ: SUBCUTANEOUS | 6 refills | Status: DC

## 2023-02-02 NOTE — Progress Notes (Signed)
BP 120/68   Pulse 77   Ht 5' 2.25" (1.581 m)   Wt 125 lb 6.4 oz (56.9 kg)   SpO2 99%   BMI 22.75 kg/m    Subjective:    Patient ID: Brianna Leon, female    DOB: Jul 22, 1956, 67 y.o.   MRN: [id number]  HPI: Brianna Leon is a 67 y.o. female presenting on 02/02/2023 for comprehensive medical examination. Current medical complaints include:  DIABETES Hypoglycemic episodes:no Polydipsia/polyuria: no Visual disturbance: no Chest pain: no Paresthesias: no Glucose Monitoring: no  Accucheck frequency: Not Checking Taking Insulin?: no Blood Pressure Monitoring: not checking Retinal Examination: Up to Date Foot Exam: Up to Date Diabetic Education: Completed Pneumovax: Up to Date Influenza: Up to Date Aspirin: yes  HYPERTENSION / HYPERLIPIDEMIA Satisfied with current treatment? yes Duration of hypertension: chronic BP monitoring frequency: not checking BP medication side effects: not on any Duration of hyperlipidemia: chronic Cholesterol medication side effects: no Cholesterol supplements: none Past cholesterol medications: simvastatin Medication compliance: excellent compliance Aspirin: yes Recent stressors: no Recurrent headaches: no Visual changes: no Palpitations: no Dyspnea: no Chest pain: no Lower extremity edema: no Dizzy/lightheaded: no  ANXIETY/STRESS Duration: chronic Status:stable Anxious mood: yes  Excessive worrying: yes Irritability: yes  Sweating: no Nausea: no Palpitations:no Hyperventilation: no Panic attacks: no Agoraphobia: no  Obscessions/compulsions: no Depressed mood: no    02/02/2023    2:39 PM 11/03/2022    2:37 PM 08/04/2022    3:41 PM 05/04/2022   11:12 AM 02/01/2022   11:18 AM  Depression screen PHQ 2/9  Decreased Interest 0 0 0 0 0  Down, Depressed, Hopeless 0 0 0 0 0  PHQ - 2 Score 0 0 0 0 0  Altered sleeping 0 3 0 0 0  Tired, decreased energy 0 3 0 0 0  Change in appetite 0 0 0 0 0  Feeling bad or failure about  yourself  0 0 0 0 0  Trouble concentrating 0 0 0 0 0  Moving slowly or fidgety/restless 0 0 0 0 0  Suicidal thoughts 0 0 0 0 0  PHQ-9 Score 0 6 0 0 0  Difficult doing work/chores Not difficult at all Not difficult at all Not difficult at all Not difficult at all       02/02/2023    2:39 PM 11/03/2022    2:39 PM 08/04/2022    3:41 PM 05/04/2022   11:12 AM  GAD 7 : Generalized Anxiety Score  Nervous, Anxious, on Edge 0 0 0 0  Control/stop worrying 0 0 0 0  Worry too much - different things 0 0 0 0  Trouble relaxing 0 0 0 0  Restless 0 0 0 0  Easily annoyed or irritable 0 0 0 0  Afraid - awful might happen 0 0 0 0  Total GAD 7 Score 0 0 0 0  Anxiety Difficulty Not difficult at all Not difficult at all Not difficult at all Not difficult at all   Anhedonia: no Weight changes: no Insomnia: yes   Hypersomnia: no Fatigue/loss of energy: yes Feelings of worthlessness: no Feelings of guilt: no Impaired concentration/indecisiveness: no Suicidal ideations: no  Crying spells: no Recent Stressors/Life Changes: no   Relationship problems: no   Family stress: no     Financial stress: no    Job stress: yes    Recent death/loss: no  Menopausal Symptoms: no  Functional Status Survey: Is the patient deaf or have difficulty hearing?: Yes Does the patient  have difficulty seeing, even when wearing glasses/contacts?: No Does the patient have difficulty concentrating, remembering, or making decisions?: No Does the patient have difficulty walking or climbing stairs?: No Does the patient have difficulty dressing or bathing?: No Does the patient have difficulty doing errands alone such as visiting a doctor's office or shopping?: No     02/02/2023    2:38 PM 11/03/2022    2:39 PM 05/04/2022   11:12 AM 02/01/2022   11:18 AM 02/11/2021    3:04 PM  [place] in the past year? 1 0 0 0 0  Number falls in past yr: 1 0 0 0 0  Injury with Fall? 0 0 0 0 0  Risk for fall due to : History of  fall(s) No Fall Risks No Fall Risks No Fall Risks No Fall Risks  Follow up Falls evaluation completed Falls evaluation completed Falls evaluation completed Falls evaluation completed Falls evaluation completed    Depression Screen    02/02/2023    2:39 PM 11/03/2022    2:37 PM 08/04/2022    3:41 PM 05/04/2022   11:12 AM 02/01/2022   11:18 AM  Depression screen PHQ 2/9  Decreased Interest 0 0 0 0 0  Down, Depressed, Hopeless 0 0 0 0 0  PHQ - 2 Score 0 0 0 0 0  Altered sleeping 0 3 0 0 0  Tired, decreased energy 0 3 0 0 0  Change in appetite 0 0 0 0 0  Feeling bad or failure about yourself  0 0 0 0 0  Trouble concentrating 0 0 0 0 0  Moving slowly or fidgety/restless 0 0 0 0 0  Suicidal thoughts 0 0 0 0 0  PHQ-9 Score 0 6 0 0 0  Difficult doing work/chores Not difficult at all Not difficult at all Not difficult at all Not difficult at all      Advanced Directives Does patient have a HCPOA?    yes If yes, name and contact information:  Does patient have a living will or MOST form?  yes  Past Medical History:  Past Medical History:  Diagnosis Date   Arthritis    KNEES   Biliary colic [id number]   CAD (coronary artery disease)    DM (diabetes mellitus), type 2 ([place]) 2000   insulin started 2016   Elevated liver enzymes    Family history of colonic polyps    father   GERD (gastroesophageal reflux disease)    Hemorrhage    DURING PREGNANCY 31 YEARS AGO   Hyperlipidemia    IBS (irritable bowel syndrome)    Migraine    RUQ pain 06/12/2017    Surgical History:  Past Surgical History:  Procedure Laterality Date   CARDIAC CATHETERIZATION     x 2   CARPAL TUNNEL RELEASE Right    CHOLECYSTECTOMY N/A 09/20/2017   Procedure: LAPAROSCOPIC CHOLECYSTECTOMY WITH INTRAOPERATIVE CHOLANGIOGRAM;  Surgeon: Robert Bellow, MD;  Location: [facility] ORS;  Service: General;  Laterality: N/A;   COLONOSCOPY  05/18/2015   repeat 5 years   COLONOSCOPY WITH PROPOFOL N/A 07/01/2020   Procedure:  COLONOSCOPY WITH PROPOFOL;  Surgeon: [name], MD;  [facility] ENDOSCOPY;  Service: Gastroenterology;  Laterality: N/A;   ESOPHAGOGASTRODUODENOSCOPY  05/18/2015   KNEE ARTHROSCOPY  2001 and 2009   TONSILLECTOMY     TOTAL ABDOMINAL HYSTERECTOMY  22 years ago    Medications:  Current Outpatient Medications on File Prior to Visit  Medication Sig  Ascorbic Acid (CHEWABLE VITAMIN C PO) Take by mouth daily.   aspirin EC 81 MG tablet Take 81 mg by mouth daily.   blood glucose meter kit and supplies KIT Dispense based on patient and insurance preference. Use up to four times daily as directed. (FOR ICD-9 250.00, 250.01).   chlorhexidine (PERIDEX) 0.12 % solution by Mouth Rinse route in the morning and at bedtime.   dicyclomine (BENTYL) 10 MG capsule Take 1 capsule (10 mg total) by mouth 4 (four) times daily -  before meals and at bedtime.   EPINEPHrine 0.3 mg/0.3 mL IJ SOAJ injection INJECT INTRAMUSCULARLY AS DIRECTED   fexofenadine (ALLEGRA) 180 MG tablet Take 180 mg by mouth daily.   ibuprofen (ADVIL) 600 MG tablet Take 600 mg by mouth every 6 (six) hours as needed.   Multiple Vitamins-Minerals (OCUVITE ADULT 50+ PO) Take 1 capsule by mouth daily.   Multiple Vitamins-Minerals (ZINC PO) Take by mouth daily.   UNABLE TO FIND Take by mouth daily. Vitamin D3, vitamin B, Bioflex   UNABLE TO FIND Take by mouth daily. Ocutive eye vitamins, cinnamon vitamins   No current facility-administered medications on file prior to visit.    Allergies:  Allergies  Allergen Reactions   Carafate [Sucralfate] Anaphylaxis and Hives   Atorvastatin Other (See Comments)    Unknown   Metformin And Related Diarrhea   Other Hives and Other (See Comments)    Lysol Cleaning Products   Penicillins Nausea And Vomiting   Tramadol Nausea Only    Social History:  Social History   Socioeconomic History   Marital status: Married    Spouse name: Not on file   Number of children: Not on file   Years of  education: Not on file   Highest education level: Not on file  Occupational History   Not on file  Tobacco Use   Smoking status: Never   Smokeless tobacco: Never  Vaping Use   Vaping Use: Never used  Substance and Sexual Activity   Alcohol use: No   Drug use: No   Sexual activity: Yes  Other Topics Concern   Not on file  Social History Narrative   Not on file   Social Determinants of Health   Financial Resource Strain: Not on file  Food Insecurity: Not on file  Transportation Needs: Not on file  Physical Activity: Not on file  Stress: Not on file  Social Connections: Not on file  Intimate Partner Violence: Not on file   Social History   Tobacco Use  Smoking Status Never  Smokeless Tobacco Never   Social History   Substance and Sexual Activity  Alcohol Use No    Family History:  Family History  Problem Relation Age of Onset   Colon polyps Father    Diabetes Father    Hypertension Father    Heart disease Father    Stroke Father    Diabetes Mother    Hypertension Mother    Diabetes Brother    Heart failure Brother        pacemaker   Hypertension Brother    Heart attack Brother    Migraines Daughter    Lupus Son    Diabetes Maternal Grandmother    Heart disease Maternal Grandmother    Stroke Maternal Grandfather    Hypertension Paternal Grandmother    Stroke Paternal Grandfather    Hyperlipidemia Brother    Breast cancer Neg Hx     Past medical history, surgical history, medications, allergies, family  history and social history reviewed with patient today and changes made to appropriate areas of the chart.   Review of Systems  Constitutional: Negative.   HENT: Negative.    Eyes: Negative.   Respiratory: Negative.    Cardiovascular: Negative.   Gastrointestinal:  Positive for constipation and heartburn (with food choices). Negative for abdominal pain, blood in stool, diarrhea, melena, nausea and vomiting.  Genitourinary: Negative.    Musculoskeletal:  Positive for joint pain (in her hands). Negative for back pain, falls, myalgias and neck pain.  Skin: Negative.   Psychiatric/Behavioral:  Negative for depression, hallucinations, memory loss, substance abuse and suicidal ideas. The patient is nervous/anxious. The patient does not have insomnia.     All other ROS negative except what is listed above and in the HPI.      Objective:    BP 120/68   Pulse 77   Ht 5' 2.25" (1.581 m)   Wt 125 lb 6.4 oz (56.9 kg)   SpO2 99%   BMI 22.75 kg/m   Wt Readings from Last 3 Encounters:  02/02/23 125 lb 6.4 oz (56.9 kg)  11/03/22 125 lb 11.2 oz (57 kg)  08/04/22 129 lb 8 oz (58.7 kg)    Vision Screening   Right eye Left eye Both eyes  Without correction 20/25 20/50 20/25   With correction       Physical Exam Vitals and nursing note reviewed.  Constitutional:      General: She is not in acute distress.    Appearance: Normal appearance. She is normal weight. She is not ill-appearing, toxic-appearing or diaphoretic.  HENT:     Head: Normocephalic and atraumatic.     Right Ear: Tympanic membrane, ear canal and external ear normal. There is no impacted cerumen.     Left Ear: Tympanic membrane, ear canal and external ear normal. There is no impacted cerumen.     Nose: Nose normal. No congestion or rhinorrhea.     Mouth/Throat:     Mouth: Mucous membranes are moist.     Pharynx: Oropharynx is clear. No oropharyngeal exudate or posterior oropharyngeal erythema.  Eyes:     General: No scleral icterus.       Right eye: No discharge.        Left eye: No discharge.     Extraocular Movements: Extraocular movements intact.     Conjunctiva/sclera: Conjunctivae normal.     Pupils: Pupils are equal, round, and reactive to light.  Neck:     Vascular: No carotid bruit.  Cardiovascular:     Rate and Rhythm: Normal rate and regular rhythm.     Pulses: Normal pulses.     Heart sounds: No murmur heard.    No friction rub. No gallop.   Pulmonary:     Effort: Pulmonary effort is normal. No respiratory distress.     Breath sounds: Normal breath sounds. No stridor. No wheezing, rhonchi or rales.  Chest:     Chest wall: No tenderness.  Abdominal:     General: Abdomen is flat. Bowel sounds are normal. There is no distension.     Palpations: Abdomen is soft. There is no mass.     Tenderness: There is no abdominal tenderness. There is no right CVA tenderness, left CVA tenderness, guarding or rebound.     Hernia: No hernia is present.  Genitourinary:    Comments: Breast and pelvic exams deferred with shared decision making Musculoskeletal:        General: No swelling, tenderness, deformity  or signs of injury.     Cervical back: Normal range of motion and neck supple. No rigidity. No muscular tenderness.     Right lower leg: No edema.     Left lower leg: No edema.  Lymphadenopathy:     Cervical: No cervical adenopathy.  Skin:    General: Skin is warm and dry.     Capillary Refill: Capillary refill takes less than 2 seconds.     Coloration: Skin is not jaundiced or pale.     Findings: No bruising, erythema, lesion or rash.  Neurological:     General: No focal deficit present.     Mental Status: She is alert and oriented to person, place, and time. Mental status is at baseline.     Cranial Nerves: No cranial nerve deficit.     Sensory: No sensory deficit.     Motor: No weakness.     Coordination: Coordination normal.     Gait: Gait normal.     Deep Tendon Reflexes: Reflexes normal.  Psychiatric:        Mood and Affect: Mood normal.        Behavior: Behavior normal.        Thought Content: Thought content normal.        Judgment: Judgment normal.        02/02/2023    3:09 PM  6CIT Screen  What Year? 0 points  What month? 0 points  What time? 0 points  Count back from 20 0 points  Months in reverse 2 points  Repeat phrase 0 points  Total Score 2 points   Results for orders placed or performed in visit on  11/03/22  Bayer DCA Hb A1c Waived  Result Value Ref Range   HB A1C (BAYER DCA - WAIVED) 7.6 (H) 4.8 - 5.6 %      Assessment & Plan:   Problem List Items Addressed This Visit       Cardiovascular and Mediastinum   CAD (coronary artery disease)    Will keep BP and cholesterol under good control. Continue to monitor. Call with any concerns.       Relevant Medications   simvastatin (ZOCOR) 40 MG tablet     Endocrine   DM (diabetes mellitus), type 2 ([place])    Improved with A1c of 7.4 down from 7.6. Continue current regimen. Continue to monitor. Call with any concerns.       Relevant Medications   Dulaglutide (TRULICITY) 3 0000000 SOPN   empagliflozin (JARDIANCE) 25 MG TABS tablet   simvastatin (ZOCOR) 40 MG tablet   Other Relevant Orders   Microalbumin, Urine Waived   Bayer DCA Hb A1c Waived   CBC with Differential/Platelet   Comprehensive metabolic panel   Urinalysis, Routine w reflex microscopic   TSH     Other   Hyperlipidemia    Under good control on current regimen. Continue current regimen. Continue to monitor. Call with any concerns. Refills given. Labs drawn today.       Relevant Medications   simvastatin (ZOCOR) 40 MG tablet   Other Relevant Orders   CBC with Differential/Platelet   Comprehensive metabolic panel   Lipid Panel w/o Chol/HDL Ratio   Anxiety    Under good control on current regimen. Continue current regimen. Continue to monitor. Call with any concerns. Refills given for 3 months.        Relevant Medications   escitalopram (LEXAPRO) 20 MG tablet   LORazepam (ATIVAN) 0.5 MG tablet  Psychophysiological insomnia    Under good control on current regimen. Continue current regimen. Continue to monitor. Call with any concerns.        Other Visit Diagnoses     Welcome to Medicare preventive visit    -  Primary   Preventative care discussed today as below. Call with any concerns.   Screening for cardiovascular condition       EKG done today.    Relevant Orders   EKG 12-Lead (Completed)   Pyuria       Await culture.   Relevant Orders   Urine Culture   Need for vaccination against Streptococcus pneumoniae       Prevnar given today.   Relevant Orders   Pneumococcal conjugate vaccine 20-valent (Prevnar 20)   Screening for osteoporosis       DEXA ordered.   Relevant Orders   DG Bone Density   Encounter for screening mammogram for malignant neoplasm of breast       Due in May. Ordered today.   Relevant Orders   MM 3D SCREENING MAMMOGRAM BILATERAL BREAST        Preventative Services:  AAA screening: N/A Health Risk Assessment and Personalized Prevention Plan: Done today Bone Mass Measurements: Will get with Mammogram in May Breast Cancer Screening: Due in May CVD Screening: Done today Cervical Cancer Screening: N/A Colon Cancer Screening: up to date Depression Screening: Done today Diabetes Screening: Done today Glaucoma Screening: See your eye doctor Hepatitis B vaccine: N/A Hepatitis C screening: up to date HIV Screening: Up to date Flu Vaccine: Up to date Lung cancer Screening: N/A Obesity Screening: Done today Pneumonia Vaccines (2): Given today STI Screening: N/A  Follow up plan: Return in about 3 months (around 05/05/2023).   LABORATORY TESTING:  - Pap smear: not applicable  IMMUNIZATIONS:   - Tdap: Tetanus vaccination status reviewed: last tetanus booster within 10 years. - Influenza: Up to date - Pneumovax: Up to date - Prevnar: Administered today - Zostavax vaccine: Up to date  SCREENING: -Mammogram: Ordered today  - Colonoscopy: Up to date  - Bone Density: Ordered today  -Hearing Test: Ordered today   PATIENT COUNSELING:   Advised to take 1 mg of folate supplement per day if capable of pregnancy.   Sexuality: Discussed sexually transmitted diseases, partner selection, use of condoms, avoidance of unintended pregnancy  and contraceptive alternatives.   Advised to avoid cigarette  smoking.  I discussed with the patient that most people either abstain from alcohol or drink within safe limits (<=14/week and <=4 drinks/occasion for males, <=7/weeks and <= 3 drinks/occasion for females) and that the risk for alcohol disorders and other health effects rises proportionally with the number of drinks per week and how often a drinker exceeds daily limits.  Discussed cessation/primary prevention of drug use and availability of treatment for abuse.   Diet: Encouraged to adjust caloric intake to maintain  or achieve ideal body weight, to reduce intake of dietary saturated fat and total fat, to limit sodium intake by avoiding high sodium foods and not adding table salt, and to maintain adequate dietary potassium and calcium preferably from fresh fruits, vegetables, and low-fat dairy products.    stressed the importance of regular exercise  Injury prevention: Discussed safety belts, safety helmets, smoke detector, smoking near bedding or upholstery.   Dental health: Discussed importance of regular tooth brushing, flossing, and dental visits.    NEXT PREVENTATIVE PHYSICAL DUE IN 1 YEAR. Return in about 3 months (around 05/05/2023).

## 2023-02-02 NOTE — Assessment & Plan Note (Signed)
Under good control on current regimen. Continue current regimen. Continue to monitor. Call with any concerns. Refills given. Labs drawn today.   

## 2023-02-02 NOTE — Assessment & Plan Note (Signed)
Will keep BP and cholesterol under good control. Continue to monitor. Call with any concerns.  

## 2023-02-02 NOTE — Patient Instructions (Addendum)
Please call to schedule your mammogram and bone density Due after 03/10/23: [facility] [place]: [address], [place], [place] [address] Phone: [phone]  [place] [facility] [address] [place],    [address] Phone: [phone]    Preventative Services:  AAA screening: N/A Health Risk Assessment and Personalized Prevention Plan: Done today Bone Mass Measurements: Will get with Mammogram in May Breast Cancer Screening: Due in May CVD Screening: Done today Cervical Cancer Screening: N/A Colon Cancer Screening: up to date Depression Screening: Done today Diabetes Screening: Done today Glaucoma Screening: See your eye doctor Hepatitis B vaccine: N/A Hepatitis C screening: up to date HIV Screening: Up to date Flu Vaccine: Up to date Lung cancer Screening: N/A Obesity Screening: Done today Pneumonia Vaccines (2): Given today STI Screening: N/A

## 2023-02-02 NOTE — Assessment & Plan Note (Signed)
Improved with A1c of 7.4 down from 7.6. Continue current regimen. Continue to monitor. Call with any concerns.

## 2023-02-02 NOTE — Assessment & Plan Note (Signed)
Under good control on current regimen. Continue current regimen. Continue to monitor. Call with any concerns.   

## 2023-02-02 NOTE — Assessment & Plan Note (Signed)
Under good control on current regimen. Continue current regimen. Continue to monitor. Call with any concerns. Refills given for 3 months.   

## 2023-02-03 LAB — CBC WITH DIFFERENTIAL/PLATELET
Basophils Absolute: 0 x10E3/uL (ref 0.0–0.2)
Basos: 0 %
EOS (ABSOLUTE): 0.1 x10E3/uL (ref 0.0–0.4)
Eos: 2 %
Hematocrit: 41.9 % (ref 34.0–46.6)
Hemoglobin: 14 g/dL (ref 11.1–15.9)
Immature Grans (Abs): 0 x10E3/uL (ref 0.0–0.1)
Immature Granulocytes: 0 %
Lymphocytes Absolute: 2.4 x10E3/uL (ref 0.7–3.1)
Lymphs: 29 %
MCH: 32.3 pg (ref 26.6–33.0)
MCHC: 33.4 g/dL (ref 31.5–35.7)
MCV: 97 fL (ref 79–97)
Monocytes Absolute: 0.6 x10E3/uL (ref 0.1–0.9)
Monocytes: 7 %
Neutrophils Absolute: 5.3 x10E3/uL (ref 1.4–7.0)
Neutrophils: 62 %
Platelets: 220 x10E3/uL (ref 150–450)
RBC: 4.33 x10E6/uL (ref 3.77–5.28)
RDW: 11 % — ABNORMAL LOW (ref 11.7–15.4)
WBC: 8.5 x10E3/uL (ref 3.4–10.8)

## 2023-02-03 LAB — COMPREHENSIVE METABOLIC PANEL
ALT: 27 IU/L (ref 0–32)
AST: 23 IU/L (ref 0–40)
Albumin/Globulin Ratio: 1.9 (ref 1.2–2.2)
Albumin: 4.6 g/dL (ref 3.9–4.9)
Alkaline Phosphatase: 78 IU/L (ref 44–121)
BUN/Creatinine Ratio: 23 (ref 12–28)
BUN: 14 mg/dL (ref 8–27)
Bilirubin Total: 0.4 mg/dL (ref 0.0–1.2)
CO2: 22 mmol/L (ref 20–29)
Calcium: 9.1 mg/dL (ref 8.7–10.3)
Chloride: 97 mmol/L (ref 96–106)
Creatinine, Ser: 0.6 mg/dL (ref 0.57–1.00)
Globulin, Total: 2.4 g/dL (ref 1.5–4.5)
Glucose: 294 mg/dL — ABNORMAL HIGH (ref 70–99)
Potassium: 3.8 mmol/L (ref 3.5–5.2)
Sodium: 138 mmol/L (ref 134–144)
Total Protein: 7 g/dL (ref 6.0–8.5)
eGFR: 100 mL/min/{1.73_m2} (ref >59)

## 2023-02-03 LAB — TSH: TSH: 0.624 u[IU]/mL (ref 0.450–4.500)

## 2023-02-03 LAB — LIPID PANEL W/O CHOL/HDL RATIO
Cholesterol, Total: 151 mg/dL (ref 100–199)
HDL: 41 mg/dL (ref >39)
LDL Chol Calc (NIH): 91 mg/dL (ref 0–99)
Triglycerides: 101 mg/dL (ref 0–149)
VLDL Cholesterol Cal: 19 mg/dL (ref 5–40)

## 2023-02-07 ENCOUNTER — Telehealth: Payer: Self-pay | Admitting: Family Medicine

## 2023-02-07 LAB — URINE CULTURE

## 2023-02-07 NOTE — Telephone Encounter (Addendum)
Brianna Leon from [place] stated he is calling in regards to PA for Trulicity it has been approved effective today for a year.  Please advise.

## 2023-02-07 NOTE — Telephone Encounter (Signed)
Copied from [place] [id number]. Topic: General - Inquiry >> Feb 07, 2023  9:28 AM Ludger Nutting wrote: Brianna Leon with [facility] Medicare is needing additional clinical information for the prior authorization for Dulaglutide (TRULICITY) 3 0000000 SOPN. Please follow up with Brianna Leon. [phone] opt 5

## 2023-02-07 NOTE — Telephone Encounter (Signed)
Spoke with Jasmine from the clinical team with [facility] and provided the requested clinical information for the pharmacist. Jasmines states she will send it over to the pharmacy team for review and let our office know the outcome.

## 2023-02-13 ENCOUNTER — Other Ambulatory Visit: Payer: Self-pay | Admitting: Family Medicine

## 2023-02-13 MED ORDER — SULFAMETHOXAZOLE-TRIMETHOPRIM 800-160 MG PO TABS: 1.0000 | ORAL_TABLET | Freq: Two times a day (BID) | ORAL | 0 refills | Status: DC

## 2023-03-07 DIAGNOSIS — Z124 Encounter for screening for malignant neoplasm of cervix: Secondary | ICD-10-CM | POA: Diagnosis not present

## 2023-03-07 DIAGNOSIS — Z780 Asymptomatic menopausal state: Secondary | ICD-10-CM | POA: Diagnosis not present

## 2023-03-07 DIAGNOSIS — Z1211 Encounter for screening for malignant neoplasm of colon: Secondary | ICD-10-CM | POA: Diagnosis not present

## 2023-03-07 DIAGNOSIS — Z1231 Encounter for screening mammogram for malignant neoplasm of breast: Secondary | ICD-10-CM | POA: Diagnosis not present

## 2023-03-12 DIAGNOSIS — R0602 Shortness of breath: Secondary | ICD-10-CM | POA: Diagnosis not present

## 2023-03-12 DIAGNOSIS — R079 Chest pain, unspecified: Secondary | ICD-10-CM | POA: Diagnosis not present

## 2023-03-12 DIAGNOSIS — E785 Hyperlipidemia, unspecified: Secondary | ICD-10-CM | POA: Diagnosis not present

## 2023-03-12 DIAGNOSIS — E119 Type 2 diabetes mellitus without complications: Secondary | ICD-10-CM | POA: Diagnosis not present

## 2023-03-29 ENCOUNTER — Emergency Department
Admission: EM | Admit: 2023-03-29 | Discharge: 2023-03-29 | Disposition: A | Discharge status: Home / Self Care | Payer: Medicare Other | Attending: Emergency Medicine | Admitting: Emergency Medicine

## 2023-03-29 ENCOUNTER — Emergency Department: Payer: Medicare Other

## 2023-03-29 ENCOUNTER — Ambulatory Visit
Admission: EM | Admit: 2023-03-29 | Discharge: 2023-03-29 | Disposition: A | Discharge status: Home / Self Care | Payer: Medicare Other | Attending: Emergency Medicine | Admitting: Emergency Medicine

## 2023-03-29 ENCOUNTER — Other Ambulatory Visit: Payer: Self-pay

## 2023-03-29 DIAGNOSIS — K760 Fatty (change of) liver, not elsewhere classified: Secondary | ICD-10-CM | POA: Diagnosis not present

## 2023-03-29 DIAGNOSIS — R1031 Right lower quadrant pain: Secondary | ICD-10-CM | POA: Diagnosis not present

## 2023-03-29 DIAGNOSIS — R739 Hyperglycemia, unspecified: Secondary | ICD-10-CM | POA: Diagnosis not present

## 2023-03-29 DIAGNOSIS — E1165 Type 2 diabetes mellitus with hyperglycemia: Secondary | ICD-10-CM

## 2023-03-29 DIAGNOSIS — U071 COVID-19: Secondary | ICD-10-CM

## 2023-03-29 DIAGNOSIS — R531 Weakness: Secondary | ICD-10-CM | POA: Diagnosis not present

## 2023-03-29 DIAGNOSIS — R109 Unspecified abdominal pain: Secondary | ICD-10-CM | POA: Diagnosis not present

## 2023-03-29 DIAGNOSIS — R519 Headache, unspecified: Secondary | ICD-10-CM

## 2023-03-29 DIAGNOSIS — I700 Atherosclerosis of aorta: Secondary | ICD-10-CM | POA: Diagnosis not present

## 2023-03-29 DIAGNOSIS — R1084 Generalized abdominal pain: Secondary | ICD-10-CM | POA: Diagnosis not present

## 2023-03-29 DIAGNOSIS — R030 Elevated blood-pressure reading, without diagnosis of hypertension: Secondary | ICD-10-CM

## 2023-03-29 LAB — URINALYSIS, W/ REFLEX TO CULTURE (INFECTION SUSPECTED)
Bacteria, UA: NONE SEEN
Bilirubin Urine: NEGATIVE
Glucose, UA: >=500 mg/dL — AB
Hgb urine dipstick: NEGATIVE
Ketones, ur: 5 mg/dL — AB
Leukocytes,Ua: NEGATIVE
Nitrite: NEGATIVE
Protein, ur: NEGATIVE mg/dL
Specific Gravity, Urine: 1.038 — ABNORMAL HIGH (ref 1.005–1.030)
pH: 5 (ref 5.0–8.0)

## 2023-03-29 LAB — CBC
HCT: 43.8 % (ref 36.0–46.0)
Hemoglobin: 14.8 g/dL (ref 12.0–15.0)
MCH: 32.2 pg (ref 26.0–34.0)
MCHC: 33.8 g/dL (ref 30.0–36.0)
MCV: 95.4 fL (ref 80.0–100.0)
Platelets: 189 10*3/uL (ref 150–400)
RBC: 4.59 MIL/uL (ref 3.87–5.11)
RDW: 11.8 % (ref 11.5–15.5)
WBC: 10.6 10*3/uL — ABNORMAL HIGH (ref 4.0–10.5)
nRBC: 0 % (ref 0.0–0.2)

## 2023-03-29 LAB — BASIC METABOLIC PANEL
Anion gap: 11 (ref 5–15)
BUN: 8 mg/dL (ref 8–23)
CO2: 26 mmol/L (ref 22–32)
Calcium: 9.2 mg/dL (ref 8.9–10.3)
Chloride: 98 mmol/L (ref 98–111)
Creatinine, Ser: 0.54 mg/dL (ref 0.44–1.00)
GFR, Estimated: >60 mL/min (ref >60)
Glucose, Bld: 265 mg/dL — ABNORMAL HIGH (ref 70–99)
Potassium: 3.4 mmol/L — ABNORMAL LOW (ref 3.5–5.1)
Sodium: 135 mmol/L (ref 135–145)

## 2023-03-29 LAB — POCT URINALYSIS DIP (MANUAL ENTRY)
Bilirubin, UA: NEGATIVE
Blood, UA: NEGATIVE
Glucose, UA: >=1000 mg/dL — AB
Ketones, POC UA: NEGATIVE mg/dL
Leukocytes, UA: NEGATIVE
Nitrite, UA: NEGATIVE
Protein Ur, POC: NEGATIVE mg/dL
Spec Grav, UA: <=1.005 — AB (ref 1.010–1.025)
Urobilinogen, UA: 0.2 E.U./dL
pH, UA: 5.5 (ref 5.0–8.0)

## 2023-03-29 LAB — POCT FASTING CBG KUC MANUAL ENTRY: POCT Glucose (KUC): 312 mg/dL — AB (ref 70–99)

## 2023-03-29 LAB — CBG MONITORING, ED: Glucose-Capillary: 233 mg/dL — ABNORMAL HIGH (ref 70–99)

## 2023-03-29 LAB — SARS CORONAVIRUS 2 BY RT PCR: SARS Coronavirus 2 by RT PCR: POSITIVE — AB

## 2023-03-29 LAB — CK: Total CK: 35 U/L — ABNORMAL LOW (ref 38–234)

## 2023-03-29 MED ORDER — IOHEXOL 300 MG/ML  SOLN
100.0000 mL | Freq: Once | INTRAMUSCULAR | Status: AC | PRN
  Administered 2023-03-29: 100 mL via INTRAVENOUS

## 2023-03-29 MED ORDER — SODIUM CHLORIDE 0.9 % IV BOLUS
1000.0000 mL | Freq: Once | INTRAVENOUS | Status: AC
  Administered 2023-03-29: 1000 mL via INTRAVENOUS

## 2023-03-29 MED ORDER — NAPROXEN 500 MG PO TABS: 500.0000 mg | ORAL_TABLET | Freq: Two times a day (BID) | ORAL | 0 refills | Status: AC

## 2023-03-29 MED ORDER — ONDANSETRON 4 MG PO TBDP: 4.0000 mg | ORAL_TABLET | Freq: Three times a day (TID) | ORAL | 0 refills | Status: AC | PRN

## 2023-03-29 MED ORDER — KETOROLAC TROMETHAMINE 15 MG/ML IJ SOLN
15.0000 mg | Freq: Once | INTRAMUSCULAR | Status: AC
  Administered 2023-03-29: 15 mg via INTRAVENOUS
  Filled 2023-03-29: qty 1

## 2023-03-29 NOTE — ED Triage Notes (Signed)
Patient presents to UC for lower back pain, low grade fever, urinary freq. Since 2 days. Has not taken any medications.  Denies hematuria.

## 2023-03-29 NOTE — ED Triage Notes (Signed)
Pt sent from urgent care for eval of elevated blood sugar, abd pain, low back pain.  Pt also reports nausea.  No v/.  Pt alert  speech clear.

## 2023-03-29 NOTE — ED Notes (Signed)
ED Provider at bedside. 

## 2023-03-29 NOTE — ED Provider Notes (Signed)
Brianna Leon    CSN: [id number] Arrival date & time: 03/29/23  1841      History   Chief Complaint Chief Complaint  Patient presents with   Urinary Frequency   Abdominal Pain   Back Pain    HPI Brianna Leon is a 67 y.o. female.  Patient presents with low-grade fever, urinary frequency, generalized abdominal pain, headache, generalized weakness, low back pain x 2 days.  No treatments attempted.  She denies nausea, vomiting, diarrhea, hematuria, focal weakness, chest pain, shortness of breath, or other symptoms.  Last bowel movement yesterday.  Her medical history includes diabetes, CAD, hyperlipidemia, GERD, IBS.  The history is provided by the patient and medical records.    Past Medical History:  Diagnosis Date   Arthritis    KNEES   Biliary colic 08/02/2017   CAD (coronary artery disease)    DM (diabetes mellitus), type 2 (HCC) 2000   insulin started 2016   Elevated liver enzymes    Family history of colonic polyps    father   GERD (gastroesophageal reflux disease)    Hemorrhage    DURING PREGNANCY 31 YEARS AGO   Hyperlipidemia    IBS (irritable bowel syndrome)    Migraine    RUQ pain 06/12/2017    Patient Active Problem List   Diagnosis Date Noted   Irritable bowel syndrome with both constipation and diarrhea 02/01/2022   Malnutrition of moderate degree (HCC) 01/03/2019   Anxiety 05/17/2018   Psychophysiological insomnia 05/17/2018   CAD (coronary artery disease)    Hyperlipidemia    GERD (gastroesophageal reflux disease)    DM (diabetes mellitus), type 2 (HCC)    Elevated liver enzymes 03/26/2015   Family history of polyps in the colon 03/26/2015   Migraines 06/06/2014    Past Surgical History:  Procedure Laterality Date   CARDIAC CATHETERIZATION     x 2   CARPAL TUNNEL RELEASE Right    CHOLECYSTECTOMY N/A 09/20/2017   Procedure: LAPAROSCOPIC CHOLECYSTECTOMY WITH INTRAOPERATIVE CHOLANGIOGRAM;  Surgeon: Earline Mayotte, MD;  Location:  [facility] ORS;  Service: General;  Laterality: N/A;   COLONOSCOPY  05/18/2015   repeat 5 years   COLONOSCOPY WITH PROPOFOL N/A 07/01/2020   Procedure: COLONOSCOPY WITH PROPOFOL;  Surgeon: [name], MD;  Location: [facility] ENDOSCOPY;  Service: Gastroenterology;  Laterality: N/A;   ESOPHAGOGASTRODUODENOSCOPY  05/18/2015   KNEE ARTHROSCOPY  2001 and 2009   TONSILLECTOMY     TOTAL ABDOMINAL HYSTERECTOMY  22 years ago    OB History     Gravida  2   Para  2   Term      Preterm      AB      Living         SAB      IAB      Ectopic      Multiple      Live Births           Obstetric Comments  1st Menstrual Cycle:  12 1st Pregnancy: 25            Home Medications    Prior to Admission medications   Medication Sig Start Date End Date Taking? Authorizing Provider  Ascorbic Acid (CHEWABLE VITAMIN C PO) Take by mouth daily.    [provider]  aspirin EC 81 MG tablet Take 81 mg by mouth daily.    [provider]  blood glucose meter kit and supplies KIT Dispense based on patient and  insurance preference. Use up to four times daily as directed. (FOR ICD-9 250.00, 250.01). 10/24/16   Minna Antis, MD  chlorhexidine (PERIDEX) 0.12 % solution by Mouth Rinse route in the morning and at bedtime. 05/08/22   [provider]  dicyclomine (BENTYL) 10 MG capsule Take 1 capsule (10 mg total) by mouth 4 (four) times daily -  before meals and at bedtime. 08/04/22   Johnson, Megan P, DO  Dulaglutide (TRULICITY) 3 MG/0.5ML SOPN INJECT 3 MG UNDER THE SKIN ONCE A WEEK 02/02/23   Johnson, Megan P, DO  empagliflozin (JARDIANCE) 25 MG TABS tablet TAKE 1 TABLET(25 MG) BY MOUTH DAILY 02/02/23   Johnson, Megan P, DO  EPINEPHrine 0.3 mg/0.3 mL IJ SOAJ injection INJECT INTRAMUSCULARLY AS DIRECTED 02/01/22   Johnson, Megan P, DO  escitalopram (LEXAPRO) 20 MG tablet TAKE 1 TABLET(20 MG) BY MOUTH DAILY 02/02/23   Johnson, Megan P, DO  fexofenadine (ALLEGRA) 180 MG tablet Take  180 mg by mouth daily.    [provider]  ibuprofen (ADVIL) 600 MG tablet Take 600 mg by mouth every 6 (six) hours as needed. 05/22/22   [provider]  LORazepam (ATIVAN) 0.5 MG tablet Take 1 tablet (0.5 mg total) by mouth 2 (two) times daily as needed for anxiety. 02/02/23   Johnson, Megan P, DO  Multiple Vitamins-Minerals (OCUVITE ADULT 50+ PO) Take 1 capsule by mouth daily.    [provider]  Multiple Vitamins-Minerals (ZINC PO) Take by mouth daily.    [provider]  omeprazole (PRILOSEC) 20 MG capsule Take 1 capsule (20 mg total) by mouth daily. 02/02/23   Johnson, Megan P, DO  simvastatin (ZOCOR) 40 MG tablet TAKE 1 TABLET(40 MG) BY MOUTH DAILY AT 6 PM 02/02/23   Johnson, Megan P, DO  sulfamethoxazole-trimethoprim (BACTRIM DS) 800-160 MG tablet Take 1 tablet by mouth 2 (two) times daily. 02/13/23   Johnson, Megan P, DO  UNABLE TO FIND Take by mouth daily. Vitamin D3, vitamin B, Bioflex    [provider]  UNABLE TO FIND Take by mouth daily. Ocutive eye vitamins, cinnamon vitamins    [provider]    Family History Family History  Problem Relation Age of Onset   Colon polyps Father    Diabetes Father    Hypertension Father    Heart disease Father    Stroke Father    Diabetes Mother    Hypertension Mother    Diabetes Brother    Heart failure Brother        pacemaker   Hypertension Brother    Heart attack Brother    Migraines Daughter    Lupus Son    Diabetes Maternal Grandmother    Heart disease Maternal Grandmother    Stroke Maternal Grandfather    Hypertension Paternal Grandmother    Stroke Paternal Grandfather    Hyperlipidemia Brother    Breast cancer Neg Hx     Social History Social History   Tobacco Use   Smoking status: Never   Smokeless tobacco: Never  Vaping Use   Vaping Use: Never used  Substance Use Topics   Alcohol use: No   Drug use: No     Allergies   Carafate [sucralfate], Atorvastatin,  Metformin and related, Other, Penicillins, and Tramadol   Review of Systems Review of Systems  Constitutional:  Positive for fever. Negative for chills.  Respiratory:  Negative for cough and shortness of breath.   Cardiovascular:  Negative for chest pain and palpitations.  Gastrointestinal:  Positive for abdominal pain. Negative for diarrhea and nausea.  Genitourinary:  Positive for frequency. Negative for dysuria.  Musculoskeletal:  Positive for back pain. Negative for gait problem.  Neurological:  Positive for weakness and headaches. Negative for dizziness, syncope, light-headedness and numbness.     Physical Exam Triage Vital Signs ED Triage Vitals  Enc Vitals Group     BP 03/29/23 1910 (!) 163/75     Pulse Rate 03/29/23 1909 95     Resp 03/29/23 1909 18     Temp 03/29/23 1909 98 F (36.7 C)     Temp src --      SpO2 03/29/23 1909 97 %     Weight --      Height --      Head Circumference --      Peak Flow --      Pain Score 03/29/23 1909 7     Pain Loc --      Pain Edu? --      Excl. in GC? --    No data found.  Updated Vital Signs BP (!) 163/69 (BP Location: Right Arm)   Pulse 95   Temp 98 F (36.7 C)   Resp 18   SpO2 97%   Visual Acuity Right Eye Distance:   Left Eye Distance:   Bilateral Distance:    Right Eye Near:   Left Eye Near:    Bilateral Near:     Physical Exam Vitals and nursing note reviewed.  Constitutional:      General: She is not in acute distress.    Appearance: Normal appearance. She is well-developed. She is not ill-appearing.  HENT:     Mouth/Throat:     Mouth: Mucous membranes are moist.  Cardiovascular:     Rate and Rhythm: Normal rate and regular rhythm.     Heart sounds: Normal heart sounds.  Pulmonary:     Effort: Pulmonary effort is normal. No respiratory distress.     Breath sounds: Normal breath sounds.  Abdominal:     General: Bowel sounds are normal.     Palpations: Abdomen is soft.     Tenderness: There is  generalized abdominal tenderness. There is no right CVA tenderness, left CVA tenderness, guarding or rebound.  Musculoskeletal:     Cervical back: Neck supple.  Skin:    General: Skin is warm and dry.     Capillary Refill: Capillary refill takes less than 2 seconds.  Neurological:     General: No focal deficit present.     Mental Status: She is alert and oriented to person, place, and time.     Sensory: No sensory deficit.     Motor: No weakness.     Gait: Gait normal.  Psychiatric:        Mood and Affect: Mood normal.        Behavior: Behavior normal.      UC Treatments / Results  Labs (all labs ordered are listed, but only abnormal results are displayed) Labs Reviewed  POCT URINALYSIS DIP (MANUAL ENTRY) - Abnormal; Notable for the following components:      Result Value   Glucose, UA >=1,000 (*)    Spec Grav, UA <=1.005 (*)    All other components within normal limits  POCT FASTING CBG [facility] MANUAL ENTRY - Abnormal; Notable for the following components:   POCT Glucose ([facility]) 312 (*)    All other components within normal limits    EKG   Radiology No results  found.  Procedures Procedures (including critical care time)  Medications Ordered in UC Medications - No data to display  Initial Impression / Assessment and Plan / UC Course  I have reviewed the triage vital signs and the nursing notes.  Pertinent labs & imaging results that were available during my care of the patient were reviewed by me and considered in my medical decision making (see chart for details).   Hyperglycemia, type 2 diabetes, generalized abdominal pain, generalized weakness, headache, elevated blood pressure reading.  No indication of urine infection.  CBG 312; last ate at lunchtime.  Discussed limitations of evaluation of her symptoms in urgent care setting.  Sending her to the ED for evaluation.  She is agreeable to this.  She declines EMS.  She will drive herself to the ED.   Final Clinical  Impressions(s) / UC Diagnoses   Final diagnoses:  Hyperglycemia  Type 2 diabetes mellitus with hyperglycemia, without long-term current use of insulin (HCC)  Generalized abdominal pain  Generalized weakness  Acute nonintractable headache, unspecified headache type  Elevated blood pressure reading     Discharge Instructions      Go to the emergency department for evaluation of your high blood sugar, abdominal pain, generalized weakness, headache, elevated blood pressure.       ED Prescriptions   None    PDMP not reviewed this encounter.   Mickie Bail, NP 03/29/23 1930

## 2023-03-29 NOTE — ED Provider Notes (Signed)
[facility] Provider Note    Event Date/Time   First MD Initiated Contact with Patient 03/29/23 2037     (approximate)   History   Chief Complaint: Hyperglycemia and Abdominal Pain   HPI  Brianna Leon is a 67 y.o. female with a history of diabetes, GERD, anxiety who is sent to the ED from urgent care due to hyperglycemia.  She reports having right-sided abdominal pain, generalized headache, low back pain and body aches for the past 2 days.  No vomiting diarrhea or fever.  Feels fatigued.  Reports increased thirst and increased urination.     Physical Exam   Triage Vital Signs: ED Triage Vitals  Enc Vitals Group     BP 03/29/23 1959 (!) 147/68     Pulse Rate 03/29/23 1959 97     Resp 03/29/23 1959 20     Temp 03/29/23 1959 98.9 F (37.2 C)     Temp Source 03/29/23 1959 Oral     SpO2 03/29/23 1959 97 %     Weight 03/29/23 2000 124 lb (56.2 kg)     Height 03/29/23 2000 5\' 2"  (1.575 m)     Head Circumference --      Peak Flow --      Pain Score 03/29/23 2000 8     Pain Loc --      Pain Edu? --      Excl. in GC? --     Most recent vital signs: Vitals:   03/29/23 1959 03/29/23 2045  BP: (!) 147/68 (!) 157/74  Pulse: 97 95  Resp: 20 16  Temp: 98.9 F (37.2 C)   SpO2: 97% 97%    General: Awake, no distress.  CV:  Good peripheral perfusion.  Regular rate and rhythm Resp:  Normal effort.  Clear to auscultation bilaterally Abd:  No distention.  Soft with right lower quadrant tenderness.  No peritonitis. Other:  Somewhat dry mucous membranes.  No rash, no edema   ED Results / Procedures / Treatments   Labs (all labs ordered are listed, but only abnormal results are displayed) Labs Reviewed  SARS CORONAVIRUS 2 BY RT PCR - Abnormal; Notable for the following components:      Result Value   SARS Coronavirus 2 by RT PCR POSITIVE (*)    All other components within normal limits  BASIC METABOLIC PANEL - Abnormal; Notable for the following  components:   Potassium 3.4 (*)    Glucose, Bld 265 (*)    All other components within normal limits  CBC - Abnormal; Notable for the following components:   WBC 10.6 (*)    All other components within normal limits  CK - Abnormal; Notable for the following components:   Total CK 35 (*)    All other components within normal limits  URINALYSIS, W/ REFLEX TO CULTURE (INFECTION SUSPECTED) - Abnormal; Notable for the following components:   Color, Urine YELLOW (*)    APPearance CLEAR (*)    Specific Gravity, Urine 1.038 (*)    Glucose, UA >=500 (*)    Ketones, ur 5 (*)    All other components within normal limits  CBG MONITORING, ED - Abnormal; Notable for the following components:   Glucose-Capillary 233 (*)    All other components within normal limits     EKG    RADIOLOGY CT abdomen pelvis interpreted by me, negative for obstruction or intra-abdominal abscess.  Radiology report reviewed, normal appendix.   PROCEDURES:  Procedures  MEDICATIONS ORDERED IN ED: Medications  sodium chloride 0.9 % bolus 1,000 mL (1,000 mLs Intravenous New Bag/Given 03/29/23 2128)  ketorolac (TORADOL) 15 MG/ML injection 15 mg (15 mg Intravenous Given 03/29/23 2129)  iohexol (OMNIPAQUE) 300 MG/ML solution 100 mL (100 mLs Intravenous Contrast Given 03/29/23 2141)     IMPRESSION / MDM / ASSESSMENT AND PLAN / ED COURSE  I reviewed the triage vital signs and the nursing notes.  DDx: Anemia, electrolyte abnormality, AKI, myositis, appendicitis, viral illness, metabolic acidosis, UTI  Patient's presentation is most consistent with acute presentation with potential threat to life or bodily function.  Diabetic patient presents with symptoms of possible abdominopelvic infection or hyperglycemic crisis.  CBG is 230.  CBC and BMP are essentially normal except for the mild hyperglycemia.  With her exam, will obtain CT abdomen pelvis along with a urinalysis, add on CK, COVID swab.  Will give IV fluids and  Toradol for hydration and symptom relief.   ----------------------------------------- 10:19 PM on 03/29/2023 ----------------------------------------- Workup reassuring, CT negative.  COVID test is positive explaining her symptoms of influenza-like illness.  Patient counseled on supportive care at home.  Stable for discharge.      FINAL CLINICAL IMPRESSION(S) / ED DIAGNOSES   Final diagnoses:  COVID-19 virus infection  Type 2 diabetes mellitus with hyperglycemia, unspecified whether long term insulin use (HCC)     Rx / DC Orders   ED Discharge Orders          Ordered    naproxen (NAPROSYN) 500 MG tablet  2 times daily with meals        03/29/23 2218    ondansetron (ZOFRAN-ODT) 4 MG disintegrating tablet  Every 8 hours PRN        03/29/23 2218             Note:  This document was prepared using Dragon voice recognition software and may include unintentional dictation errors.   Sharman Cheek, MD 03/29/23 2219

## 2023-03-29 NOTE — ED Notes (Signed)
Patient is being discharged from the Urgent Care and sent to the Emergency Department via POV . Per Arlana Pouch, NP, patient is in need of higher level of care due to hyperglycemia. Patient is aware and verbalizes understanding of plan of care.  Vitals:   03/29/23 1910 03/29/23 1922  BP: (!) 163/75 (!) 163/69  Pulse:    Resp:    Temp:    SpO2:

## 2023-03-29 NOTE — Discharge Instructions (Addendum)
Go to the emergency department for evaluation of your high blood sugar, abdominal pain, generalized weakness, headache, elevated blood pressure.

## 2023-03-29 NOTE — ED Notes (Signed)
Fsbs 233 

## 2023-04-04 ENCOUNTER — Telehealth: Payer: Self-pay | Admitting: *Deleted

## 2023-04-04 NOTE — Transitions of Care (Post Inpatient/ED Visit) (Signed)
04/04/2023  Name: Brianna Leon MRN: [id number] DOB: 30-Nov-1955  Today's TOC FU Call Status: Today's TOC FU Call Status:: Successful TOC FU Call Competed TOC FU Call Complete Date: 04/04/23  Transition Care Management Follow-up Telephone Call Date of Discharge: 03/29/23 Discharge Facility: [facility]) Type of Discharge: Emergency Department Reason for ED Visit: Other: (COVID) How have you been since you were released from the hospital?: Better Any questions or concerns?: No  Items Reviewed: Did you receive and understand the discharge instructions provided?: Yes Medications obtained,verified, and reconciled?: Yes (Medications Reviewed) Any new allergies since your discharge?: No Dietary orders reviewed?: No Do you have support at home?: Yes People in Home: spouse Name of Support/Comfort Primary Source: Fayrene Fearing  Medications Reviewed Today: Medications Reviewed Today     Reviewed by Luella Cook, RN (Case Manager) on 04/04/23 at 1358  Med List Status: <None>   Medication Order Taking? Sig Documenting Provider Last Dose Status Informant  Ascorbic Acid (CHEWABLE VITAMIN C PO) [id number] Yes Take by mouth daily. [provider] Taking Active   aspirin EC 81 MG tablet [id number] Yes Take 81 mg by mouth daily. [provider] Taking Active Self  blood glucose meter kit and supplies KIT [id number]  Dispense based on patient and insurance preference. Use up to four times daily as directed. (FOR ICD-9 250.00, 250.01). Minna Antis, MD  Active Self  chlorhexidine (PERIDEX) 0.12 % solution [id number]  by Mouth Rinse route in the morning and at bedtime. [provider]  Active   dicyclomine (BENTYL) 10 MG capsule [id number] Yes Take 1 capsule (10 mg total) by mouth 4 (four) times daily -  before meals and at bedtime. Olevia Perches P, DO Taking Active   Dulaglutide (TRULICITY) 3 MG/0.5ML SOPN [id number] Yes INJECT 3 MG UNDER THE  SKIN ONCE A WEEK Johnson, Megan P, DO Taking Active   empagliflozin (JARDIANCE) 25 MG TABS tablet [id number] Yes TAKE 1 TABLET(25 MG) BY MOUTH DAILY Johnson, Megan P, DO Taking Active   EPINEPHrine 0.3 mg/0.3 mL IJ SOAJ injection [id number] Yes INJECT INTRAMUSCULARLY AS DIRECTED Olevia Perches P, DO Taking Active   escitalopram (LEXAPRO) 20 MG tablet [id number] Yes TAKE 1 TABLET(20 MG) BY MOUTH DAILY Johnson, Megan P, DO Taking Active   fexofenadine (ALLEGRA) 180 MG tablet [id number] Yes Take 180 mg by mouth daily. [provider] Taking Active   ibuprofen (ADVIL) 600 MG tablet [id number] Yes Take 600 mg by mouth every 6 (six) hours as needed. [provider] Taking Active   LORazepam (ATIVAN) 0.5 MG tablet [id number] Yes Take 1 tablet (0.5 mg total) by mouth 2 (two) times daily as needed for anxiety. Olevia Perches P, DO Taking Active   Multiple Vitamins-Minerals (OCUVITE ADULT 50+ PO) [id number] Yes Take 1 capsule by mouth daily. [provider] Taking Active Self  Multiple Vitamins-Minerals (ZINC PO) [id number] Yes Take by mouth daily. [provider] Taking Active Self  omeprazole (PRILOSEC) 20 MG capsule [id number] Yes Take 1 capsule (20 mg total) by mouth daily. Laural Benes, Megan P, DO Taking Active   ondansetron (ZOFRAN-ODT) 4 MG disintegrating tablet [id number] Yes Take 1 tablet (4 mg total) by mouth every 8 (eight) hours as needed for nausea or vomiting. Sharman Cheek, MD Taking Active   simvastatin (ZOCOR) 40 MG tablet [id number] Yes TAKE 1 TABLET(40 MG) BY MOUTH DAILY AT 6 PM Johnson, Megan P, DO Taking Active   sulfamethoxazole-trimethoprim (BACTRIM DS) 800-160 MG tablet [id number] No Take 1 tablet by mouth  2 (two) times daily.  Patient not taking: Reported on 04/04/2023   Dorcas Carrow, DO Not Taking Active   UNABLE TO FIND [id number]  Take by mouth daily. Vitamin D3, vitamin B, Bioflex [provider]  Active Self  UNABLE TO FIND [id number]  Take by  mouth daily. Ocutive eye vitamins, cinnamon vitamins [provider]  Active Self            Home Care and Equipment/Supplies: Were Home Health Services Ordered?: NA Any new equipment or medical supplies ordered?: NA  Functional Questionnaire: Do you need assistance with bathing/showering or dressing?: No Do you need assistance with meal preparation?: No Do you need assistance with eating?: No Do you have difficulty maintaining continence: No Do you need assistance with getting out of bed/getting out of a chair/moving?: No Do you have difficulty managing or taking your medications?: No  Follow up appointments reviewed: PCP Follow-up appointment confirmed?: Yes Date of PCP follow-up appointment?: 05/04/23 Follow-up Provider: [facility] Follow-up appointment confirmed?: NA Do you need transportation to your follow-up appointment?: No Do you understand care options if your condition(s) worsen?: Yes-patient verbalized understanding   Interventions Today    Flowsheet Row Most Recent Value  General Interventions   General Interventions Discussed/Reviewed General Interventions Discussed, General Interventions Reviewed, Doctor Visits  Doctor Visits Discussed/Reviewed Doctor Visits Discussed, Doctor Visits Reviewed  Pharmacy Interventions   Pharmacy Dicussed/Reviewed Pharmacy Topics Discussed      TOC Interventions Today    Flowsheet Row Most Recent Value  TOC Interventions   TOC Interventions Discussed/Reviewed TOC Interventions Reviewed, TOC Interventions Discussed        Gean Maidens BSN RN [facility] [phone]

## 2023-04-10 ENCOUNTER — Emergency Department
Admission: EM | Admit: 2023-04-10 | Discharge: 2023-04-10 | Disposition: A | Discharge status: Home / Self Care | Payer: Medicare Other | Attending: Emergency Medicine | Admitting: Emergency Medicine

## 2023-04-10 ENCOUNTER — Emergency Department: Payer: Medicare Other

## 2023-04-10 DIAGNOSIS — R1030 Lower abdominal pain, unspecified: Secondary | ICD-10-CM | POA: Diagnosis not present

## 2023-04-10 DIAGNOSIS — K760 Fatty (change of) liver, not elsewhere classified: Secondary | ICD-10-CM | POA: Diagnosis not present

## 2023-04-10 DIAGNOSIS — I700 Atherosclerosis of aorta: Secondary | ICD-10-CM | POA: Diagnosis not present

## 2023-04-10 DIAGNOSIS — N8110 Cystocele, unspecified: Secondary | ICD-10-CM | POA: Diagnosis not present

## 2023-04-10 DIAGNOSIS — R109 Unspecified abdominal pain: Secondary | ICD-10-CM | POA: Diagnosis not present

## 2023-04-10 LAB — COMPREHENSIVE METABOLIC PANEL
ALT: 29 U/L (ref 0–44)
AST: 31 U/L (ref 15–41)
Albumin: 4.5 g/dL (ref 3.5–5.0)
Alkaline Phosphatase: 63 U/L (ref 38–126)
Anion gap: 13 (ref 5–15)
BUN: 11 mg/dL (ref 8–23)
CO2: 24 mmol/L (ref 22–32)
Calcium: 9.1 mg/dL (ref 8.9–10.3)
Chloride: 97 mmol/L — ABNORMAL LOW (ref 98–111)
Creatinine, Ser: 0.53 mg/dL (ref 0.44–1.00)
GFR, Estimated: >60 mL/min (ref >60)
Glucose, Bld: 363 mg/dL — ABNORMAL HIGH (ref 70–99)
Potassium: 3.5 mmol/L (ref 3.5–5.1)
Sodium: 134 mmol/L — ABNORMAL LOW (ref 135–145)
Total Bilirubin: 0.8 mg/dL (ref 0.3–1.2)
Total Protein: 7.6 g/dL (ref 6.5–8.1)

## 2023-04-10 LAB — CBC
HCT: 42.3 % (ref 36.0–46.0)
Hemoglobin: 14.2 g/dL (ref 12.0–15.0)
MCH: 31.7 pg (ref 26.0–34.0)
MCHC: 33.6 g/dL (ref 30.0–36.0)
MCV: 94.4 fL (ref 80.0–100.0)
Platelets: 213 10*3/uL (ref 150–400)
RBC: 4.48 MIL/uL (ref 3.87–5.11)
RDW: 12 % (ref 11.5–15.5)
WBC: 8.1 10*3/uL (ref 4.0–10.5)
nRBC: 0 % (ref 0.0–0.2)

## 2023-04-10 LAB — URINALYSIS, ROUTINE W REFLEX MICROSCOPIC
Bilirubin Urine: NEGATIVE
Glucose, UA: >=500 mg/dL — AB
Hgb urine dipstick: NEGATIVE
Ketones, ur: NEGATIVE mg/dL
Leukocytes,Ua: NEGATIVE
Nitrite: NEGATIVE
Protein, ur: NEGATIVE mg/dL
Specific Gravity, Urine: >1.046 — ABNORMAL HIGH (ref 1.005–1.030)
pH: 5 (ref 5.0–8.0)

## 2023-04-10 MED ORDER — HYDROCODONE-ACETAMINOPHEN 5-325 MG PO TABS: 1.0000 | ORAL_TABLET | Freq: Four times a day (QID) | ORAL | 0 refills | Status: AC | PRN

## 2023-04-10 MED ORDER — IOHEXOL 300 MG/ML  SOLN
100.0000 mL | Freq: Once | INTRAMUSCULAR | Status: AC | PRN
  Administered 2023-04-10: 100 mL via INTRAVENOUS

## 2023-04-10 NOTE — ED Notes (Signed)
Dr. Rosalia Hammers at bedside for pelvic exams

## 2023-04-10 NOTE — ED Notes (Signed)
Pt to CT

## 2023-04-10 NOTE — ED Triage Notes (Signed)
Pt presents to the ED due to abdominal pain. Pt c/o not having a BM since Saturday. Pt also c/o " I can see my bladder coming out my vagina". Pt states she is having discomfort when urinating. Pt A&Ox4

## 2023-04-10 NOTE — ED Provider Notes (Signed)
[facility] Provider Note    Event Date/Time   First MD Initiated Contact with Patient 04/10/23 1717     (approximate)   History   Abdominal Pain   HPI  Brianna Leon is a 67 y.o. female with history of diabetes, GERD, anxiety presenting to the emergency department for evaluation of abdominal pain.  Patient reports that for the past several weeks, she has had some lower abdominal pain. She was seen in our ER on 5/23 at which time she was found to have hyperglycemia, reassuring CT scan.  However, she reports that over the past 4 days she has not had a bowel movement.  Does report a history of multiple prior abdominal surgeries including hysterectomy, cholecystectomy.  In addition, she reports that she previously was told by her GYN that she has bladder prolapse.  She was told to avoid straining, but recently was clearing out a storage unit and exerted herself more than usual.  A few days ago, she felt that she could feel her bladder prolapsing through her vagina and attempted to push it back in.  She tried to get a hold of her OB/GYN office today, but her gynecologist was out of town and she was unable to get an appointment until next week so she presents given significant discomfort.     Physical Exam   Triage Vital Signs: ED Triage Vitals  Enc Vitals Group     BP 04/10/23 1704 (!) 153/70     Pulse Rate 04/10/23 1704 85     Resp 04/10/23 1704 16     Temp 04/10/23 1704 98 F (36.7 C)     Temp Source 04/10/23 1704 Oral     SpO2 04/10/23 1704 97 %     Weight 04/10/23 1705 124 lb 9 oz (56.5 kg)     Height --      Head Circumference --      Peak Flow --      Pain Score 04/10/23 1705 9     Pain Loc --      Pain Edu? --      Excl. in GC? --     Most recent vital signs: Vitals:   04/10/23 1930 04/10/23 2000  BP: 110/84 128/70  Pulse: 67 70  Resp: 16 18  Temp:  98 F (36.7 C)  SpO2: 98% 98%     General: Awake, interactive  CV:  Regular rate,  good peripheral perfusion.  Resp:  Lungs clear, unlabored respirations.  Abd:  Soft, nondistended, mild tenderness to palpation throughout the lower abdomen without rebound or guarding GU:   Neuro:  Symmetric facial movement, fluid speech   ED Results / Procedures / Treatments   Labs (all labs ordered are listed, but only abnormal results are displayed) Labs Reviewed  COMPREHENSIVE METABOLIC PANEL - Abnormal; Notable for the following components:      Result Value   Sodium 134 (*)    Chloride 97 (*)    Glucose, Bld 363 (*)    All other components within normal limits  URINALYSIS, ROUTINE W REFLEX MICROSCOPIC - Abnormal; Notable for the following components:   Color, Urine STRAW (*)    APPearance CLEAR (*)    Specific Gravity, Urine >1.046 (*)    Glucose, UA >=500 (*)    Bacteria, UA RARE (*)    All other components within normal limits  CBC     EKG EKG independently reviewed interpreted by myself (ER attending) demonstrates:  RADIOLOGY Imaging independently reviewed and interpreted by myself demonstrates:  CT scan demonstrates low-lying bladder  PROCEDURES:  Critical Care performed: No  Procedures   MEDICATIONS ORDERED IN ED: Medications  iohexol (OMNIPAQUE) 300 MG/ML solution 100 mL (100 mLs Intravenous Contrast Given 04/10/23 1811)     IMPRESSION / MDM / ASSESSMENT AND PLAN / ED COURSE  I reviewed the triage vital signs and the nursing notes.  Differential diagnosis includes, but is not limited to, bowel obstruction, appendicitis, pyelonephritis, UTI, cystocele, possibly with complication including urinary retention  Patient's presentation is most consistent with acute presentation with potential threat to life or bodily function.  67 year old female presenting with abdominal pain with concerns for bladder prolapse.  Lab work overall reassuring.  CT scan did demonstrate low positioning of bladder.  Postvoid residual was performed without retained urine.   Patient was updated on the results of her workup.  Did discuss her diagnosis of cystocele and recommended follow-up with her GYN.  She does have some ongoing pain.  Discussed risk and benefits of narcotics including possible worsening constipation, increased straining, worsening of cystocele.  Patient would like to be discharged with a short course of pain medication.  Strict return precautions provided.  Patient discharged in stable condition.    FINAL CLINICAL IMPRESSION(S) / ED DIAGNOSES   Final diagnoses:  Female cystocele     Rx / DC Orders   ED Discharge Orders          Ordered    HYDROcodone-acetaminophen (NORCO) 5-325 MG tablet  Every 6 hours PRN        04/10/23 1942             Note:  This document was prepared using Dragon voice recognition software and may include unintentional dictation errors.   [name], MD 04/11/23 [phone]

## 2023-04-10 NOTE — Discharge Instructions (Addendum)
You were seen in the emergency department today for evaluation of your lower abdominal pain.  Your CT scan showed that you do have prolapse of your bladder known as a cystocele.  Please follow-up with your gynecologist for further evaluation of this.  They may discuss further management options such as a pessary or discuss if surgery is appropriate for you.  Please try to avoid straining as much as possible.  You can take Tylenol and ibuprofen as needed for pain.  If you have breakthrough pain, I have sent a short course of narcotic pain medicine.  This can worsen your constipation, so please make sure you are using a regular bowel regimen.  Return to the ER for new or worsening symptoms.

## 2023-04-10 NOTE — ED Notes (Signed)
Bladder scanned pt per Dr. Denny Levy request, no residual urine was found after 4 attempts.

## 2023-04-10 NOTE — ED Notes (Signed)
Dc instructions and scripts reviewed with pt no questions or concerns at this time. Will follow up with gyn.  °

## 2023-04-14 ENCOUNTER — Emergency Department (HOSPITAL_COMMUNITY): Payer: Medicare Other

## 2023-04-14 ENCOUNTER — Other Ambulatory Visit: Payer: Self-pay

## 2023-04-14 ENCOUNTER — Inpatient Hospital Stay (HOSPITAL_COMMUNITY)
Admission: EM | Admit: 2023-04-14 | Discharge: 2023-04-16 | DRG: 185 | Disposition: A | Discharge status: Home / Self Care | Payer: Medicare Other | Attending: Surgery | Admitting: Surgery

## 2023-04-14 ENCOUNTER — Encounter (HOSPITAL_COMMUNITY): Payer: Self-pay

## 2023-04-14 DIAGNOSIS — Y92410 Unspecified street and highway as the place of occurrence of the external cause: Secondary | ICD-10-CM | POA: Diagnosis not present

## 2023-04-14 DIAGNOSIS — Z7982 Long term (current) use of aspirin: Secondary | ICD-10-CM

## 2023-04-14 DIAGNOSIS — K219 Gastro-esophageal reflux disease without esophagitis: Secondary | ICD-10-CM | POA: Diagnosis present

## 2023-04-14 DIAGNOSIS — S2249XA Multiple fractures of ribs, unspecified side, initial encounter for closed fracture: Secondary | ICD-10-CM | POA: Diagnosis present

## 2023-04-14 DIAGNOSIS — Z885 Allergy status to narcotic agent status: Secondary | ICD-10-CM | POA: Diagnosis not present

## 2023-04-14 DIAGNOSIS — Z881 Allergy status to other antibiotic agents status: Secondary | ICD-10-CM | POA: Diagnosis not present

## 2023-04-14 DIAGNOSIS — S2242XA Multiple fractures of ribs, left side, initial encounter for closed fracture: Secondary | ICD-10-CM | POA: Diagnosis not present

## 2023-04-14 DIAGNOSIS — S42022A Displaced fracture of shaft of left clavicle, initial encounter for closed fracture: Secondary | ICD-10-CM | POA: Diagnosis not present

## 2023-04-14 DIAGNOSIS — Z8249 Family history of ischemic heart disease and other diseases of the circulatory system: Secondary | ICD-10-CM | POA: Diagnosis not present

## 2023-04-14 DIAGNOSIS — I10 Essential (primary) hypertension: Secondary | ICD-10-CM | POA: Diagnosis not present

## 2023-04-14 DIAGNOSIS — R739 Hyperglycemia, unspecified: Secondary | ICD-10-CM

## 2023-04-14 DIAGNOSIS — Z7984 Long term (current) use of oral hypoglycemic drugs: Secondary | ICD-10-CM

## 2023-04-14 DIAGNOSIS — Z833 Family history of diabetes mellitus: Secondary | ICD-10-CM | POA: Diagnosis not present

## 2023-04-14 DIAGNOSIS — S42002A Fracture of unspecified part of left clavicle, initial encounter for closed fracture: Secondary | ICD-10-CM | POA: Diagnosis present

## 2023-04-14 DIAGNOSIS — Z8349 Family history of other endocrine, nutritional and metabolic diseases: Secondary | ICD-10-CM | POA: Diagnosis not present

## 2023-04-14 DIAGNOSIS — I2510 Atherosclerotic heart disease of native coronary artery without angina pectoris: Secondary | ICD-10-CM | POA: Diagnosis not present

## 2023-04-14 DIAGNOSIS — Z79899 Other long term (current) drug therapy: Secondary | ICD-10-CM | POA: Diagnosis not present

## 2023-04-14 DIAGNOSIS — Z9049 Acquired absence of other specified parts of digestive tract: Secondary | ICD-10-CM

## 2023-04-14 DIAGNOSIS — Z7985 Long-term (current) use of injectable non-insulin antidiabetic drugs: Secondary | ICD-10-CM

## 2023-04-14 DIAGNOSIS — Z888 Allergy status to other drugs, medicaments and biological substances status: Secondary | ICD-10-CM

## 2023-04-14 DIAGNOSIS — E119 Type 2 diabetes mellitus without complications: Secondary | ICD-10-CM | POA: Diagnosis not present

## 2023-04-14 DIAGNOSIS — R000 Tachycardia, unspecified: Secondary | ICD-10-CM | POA: Diagnosis not present

## 2023-04-14 DIAGNOSIS — E1165 Type 2 diabetes mellitus with hyperglycemia: Secondary | ICD-10-CM | POA: Diagnosis not present

## 2023-04-14 DIAGNOSIS — S0990XA Unspecified injury of head, initial encounter: Secondary | ICD-10-CM | POA: Diagnosis not present

## 2023-04-14 DIAGNOSIS — M25472 Effusion, left ankle: Secondary | ICD-10-CM | POA: Diagnosis not present

## 2023-04-14 DIAGNOSIS — M542 Cervicalgia: Secondary | ICD-10-CM | POA: Diagnosis not present

## 2023-04-14 DIAGNOSIS — Z823 Family history of stroke: Secondary | ICD-10-CM

## 2023-04-14 DIAGNOSIS — I700 Atherosclerosis of aorta: Secondary | ICD-10-CM | POA: Diagnosis not present

## 2023-04-14 DIAGNOSIS — Z880 Allergy status to penicillin: Secondary | ICD-10-CM | POA: Diagnosis not present

## 2023-04-14 DIAGNOSIS — M25572 Pain in left ankle and joints of left foot: Secondary | ICD-10-CM | POA: Diagnosis not present

## 2023-04-14 DIAGNOSIS — E785 Hyperlipidemia, unspecified: Secondary | ICD-10-CM | POA: Diagnosis not present

## 2023-04-14 DIAGNOSIS — S199XXA Unspecified injury of neck, initial encounter: Secondary | ICD-10-CM | POA: Diagnosis not present

## 2023-04-14 DIAGNOSIS — S42025A Nondisplaced fracture of shaft of left clavicle, initial encounter for closed fracture: Secondary | ICD-10-CM | POA: Diagnosis not present

## 2023-04-14 LAB — CBC
HCT: 41.9 % (ref 36.0–46.0)
Hemoglobin: 13.8 g/dL (ref 12.0–15.0)
MCH: 31.4 pg (ref 26.0–34.0)
MCHC: 32.9 g/dL (ref 30.0–36.0)
MCV: 95.4 fL (ref 80.0–100.0)
Platelets: 201 10*3/uL (ref 150–400)
RBC: 4.39 MIL/uL (ref 3.87–5.11)
RDW: 11.7 % (ref 11.5–15.5)
WBC: 10.6 10*3/uL — ABNORMAL HIGH (ref 4.0–10.5)
nRBC: 0 % (ref 0.0–0.2)

## 2023-04-14 LAB — CBC WITH DIFFERENTIAL/PLATELET
Abs Immature Granulocytes: 0.06 10*3/uL (ref 0.00–0.07)
Basophils Absolute: 0.1 10*3/uL (ref 0.0–0.1)
Basophils Relative: 1 %
Eosinophils Absolute: 0.1 10*3/uL (ref 0.0–0.5)
Eosinophils Relative: 1 %
HCT: 42.4 % (ref 36.0–46.0)
Hemoglobin: 14 g/dL (ref 12.0–15.0)
Immature Granulocytes: 1 %
Lymphocytes Relative: 13 %
Lymphs Abs: 1.4 10*3/uL (ref 0.7–4.0)
MCH: 31.2 pg (ref 26.0–34.0)
MCHC: 33 g/dL (ref 30.0–36.0)
MCV: 94.4 fL (ref 80.0–100.0)
Monocytes Absolute: 0.6 10*3/uL (ref 0.1–1.0)
Monocytes Relative: 6 %
Neutro Abs: 9.1 10*3/uL — ABNORMAL HIGH (ref 1.7–7.7)
Neutrophils Relative %: 78 %
Platelets: 185 10*3/uL (ref 150–400)
RBC: 4.49 MIL/uL (ref 3.87–5.11)
RDW: 11.7 % (ref 11.5–15.5)
WBC: 11.4 10*3/uL — ABNORMAL HIGH (ref 4.0–10.5)
nRBC: 0 % (ref 0.0–0.2)

## 2023-04-14 LAB — BASIC METABOLIC PANEL
Anion gap: 15 (ref 5–15)
BUN: 11 mg/dL (ref 8–23)
CO2: 25 mmol/L (ref 22–32)
Calcium: 9.2 mg/dL (ref 8.9–10.3)
Chloride: 97 mmol/L — ABNORMAL LOW (ref 98–111)
Creatinine, Ser: 0.68 mg/dL (ref 0.44–1.00)
GFR, Estimated: >60 mL/min (ref >60)
Glucose, Bld: 483 mg/dL — ABNORMAL HIGH (ref 70–99)
Potassium: 4.1 mmol/L (ref 3.5–5.1)
Sodium: 137 mmol/L (ref 135–145)

## 2023-04-14 LAB — CBG MONITORING, ED
Glucose-Capillary: 445 mg/dL — ABNORMAL HIGH (ref 70–99)
Glucose-Capillary: 208 mg/dL — ABNORMAL HIGH (ref 70–99)

## 2023-04-14 LAB — HEMOGLOBIN A1C
Hgb A1c MFr Bld: 8.9 % — ABNORMAL HIGH (ref 4.8–5.6)
Mean Plasma Glucose: 208.73 mg/dL

## 2023-04-14 LAB — CREATININE, SERUM
Creatinine, Ser: 0.55 mg/dL (ref 0.44–1.00)
GFR, Estimated: >60 mL/min (ref >60)

## 2023-04-14 LAB — HIV ANTIBODY (ROUTINE TESTING W REFLEX): HIV Screen 4th Generation wRfx: NONREACTIVE

## 2023-04-14 LAB — GLUCOSE, CAPILLARY: Glucose-Capillary: 196 mg/dL — ABNORMAL HIGH (ref 70–99)

## 2023-04-14 MED ORDER — KETOROLAC TROMETHAMINE 15 MG/ML IJ SOLN
15.0000 mg | Freq: Once | INTRAMUSCULAR | Status: AC
  Administered 2023-04-14: 15 mg via INTRAVENOUS
  Filled 2023-04-14: qty 1

## 2023-04-14 MED ORDER — PROCHLORPERAZINE EDISYLATE 10 MG/2ML IJ SOLN: 10.0000 mg | INTRAMUSCULAR | Status: DC | PRN

## 2023-04-14 MED ORDER — OXYCODONE HCL 5 MG PO TABS
10.0000 mg | ORAL_TABLET | ORAL | Status: DC | PRN
  Administered 2023-04-15 (×2): 10 mg via ORAL
  Filled 2023-04-14 (×2): qty 2

## 2023-04-14 MED ORDER — ONDANSETRON HCL 4 MG/2ML IJ SOLN: 4.0000 mg | Freq: Four times a day (QID) | INTRAMUSCULAR | Status: DC | PRN

## 2023-04-14 MED ORDER — METHOCARBAMOL 500 MG PO TABS
500.0000 mg | ORAL_TABLET | Freq: Three times a day (TID) | ORAL | Status: DC
  Administered 2023-04-14 – 2023-04-16 (×6): 500 mg via ORAL
  Filled 2023-04-14 (×6): qty 1

## 2023-04-14 MED ORDER — INSULIN ASPART 100 UNIT/ML IJ SOLN
0.0000 [IU] | Freq: Three times a day (TID) | INTRAMUSCULAR | Status: DC
  Administered 2023-04-15: 8 [IU] via SUBCUTANEOUS
  Administered 2023-04-15 – 2023-04-16 (×3): 5 [IU] via SUBCUTANEOUS

## 2023-04-14 MED ORDER — INSULIN ASPART 100 UNIT/ML IJ SOLN
5.0000 [IU] | Freq: Once | INTRAMUSCULAR | Status: AC
  Administered 2023-04-14: 5 [IU] via SUBCUTANEOUS

## 2023-04-14 MED ORDER — DOCUSATE SODIUM 100 MG PO CAPS: 100.0000 mg | ORAL_CAPSULE | Freq: Two times a day (BID) | ORAL | Status: DC

## 2023-04-14 MED ORDER — ACETAMINOPHEN 325 MG PO TABS: 650.0000 mg | ORAL_TABLET | Freq: Four times a day (QID) | ORAL | Status: DC

## 2023-04-14 MED ORDER — ONDANSETRON 4 MG PO TBDP: 4.0000 mg | ORAL_TABLET | Freq: Four times a day (QID) | ORAL | Status: DC | PRN

## 2023-04-14 MED ORDER — HYDROMORPHONE HCL 1 MG/ML IJ SOLN
0.5000 mg | INTRAMUSCULAR | Status: DC | PRN
  Administered 2023-04-14 – 2023-04-15 (×2): 0.5 mg via INTRAVENOUS
  Filled 2023-04-14: qty 1
  Filled 2023-04-14: qty 0.5

## 2023-04-14 MED ORDER — LACTATED RINGERS IV BOLUS
1000.0000 mL | Freq: Once | INTRAVENOUS | Status: AC
  Administered 2023-04-14: 1000 mL via INTRAVENOUS

## 2023-04-14 MED ORDER — POLYETHYLENE GLYCOL 3350 17 G PO PACK: 17.0000 g | PACK | Freq: Every day | ORAL | Status: DC | PRN

## 2023-04-14 MED ORDER — MORPHINE SULFATE (PF) 4 MG/ML IV SOLN
4.0000 mg | Freq: Once | INTRAVENOUS | Status: AC
  Administered 2023-04-14: 4 mg via INTRAVENOUS
  Filled 2023-04-14: qty 1

## 2023-04-14 MED ORDER — ACETAMINOPHEN 500 MG PO TABS
1000.0000 mg | ORAL_TABLET | Freq: Four times a day (QID) | ORAL | Status: DC
  Administered 2023-04-14 – 2023-04-16 (×7): 1000 mg via ORAL
  Filled 2023-04-14 (×7): qty 2

## 2023-04-14 MED ORDER — METOPROLOL TARTRATE 5 MG/5ML IV SOLN: 5.0000 mg | Freq: Four times a day (QID) | INTRAVENOUS | Status: DC | PRN

## 2023-04-14 MED ORDER — ENOXAPARIN SODIUM 30 MG/0.3ML IJ SOSY
30.0000 mg | PREFILLED_SYRINGE | Freq: Two times a day (BID) | INTRAMUSCULAR | Status: DC
  Administered 2023-04-15 – 2023-04-16 (×3): 30 mg via SUBCUTANEOUS
  Filled 2023-04-14 (×3): qty 0.3

## 2023-04-14 MED ORDER — DOCUSATE SODIUM 100 MG PO CAPS
100.0000 mg | ORAL_CAPSULE | Freq: Two times a day (BID) | ORAL | Status: DC
  Administered 2023-04-14 – 2023-04-16 (×4): 100 mg via ORAL
  Filled 2023-04-14 (×4): qty 1

## 2023-04-14 MED ORDER — LIDOCAINE 5 % EX PTCH
2.0000 | MEDICATED_PATCH | CUTANEOUS | Status: DC
  Administered 2023-04-14 – 2023-04-15 (×2): 2 via TRANSDERMAL
  Filled 2023-04-14 (×3): qty 2

## 2023-04-14 MED ORDER — SIMETHICONE 80 MG PO CHEW: 80.0000 mg | CHEWABLE_TABLET | Freq: Four times a day (QID) | ORAL | Status: DC | PRN

## 2023-04-14 MED ORDER — GABAPENTIN 300 MG PO CAPS
300.0000 mg | ORAL_CAPSULE | Freq: Three times a day (TID) | ORAL | Status: DC
  Administered 2023-04-14 – 2023-04-16 (×5): 300 mg via ORAL
  Filled 2023-04-14 (×5): qty 1

## 2023-04-14 MED ORDER — OXYCODONE-ACETAMINOPHEN 5-325 MG PO TABS
1.0000 | ORAL_TABLET | Freq: Once | ORAL | Status: AC
  Administered 2023-04-14: 1 via ORAL
  Filled 2023-04-14: qty 1

## 2023-04-14 MED ORDER — ONDANSETRON HCL 4 MG/2ML IJ SOLN
4.0000 mg | Freq: Once | INTRAMUSCULAR | Status: AC
  Administered 2023-04-14: 4 mg via INTRAVENOUS
  Filled 2023-04-14: qty 2

## 2023-04-14 MED ORDER — INSULIN ASPART 100 UNIT/ML IJ SOLN
0.0000 [IU] | Freq: Every day | INTRAMUSCULAR | Status: DC
  Administered 2023-04-15: 3 [IU] via SUBCUTANEOUS

## 2023-04-14 MED ORDER — HYDRALAZINE HCL 20 MG/ML IJ SOLN: 10.0000 mg | INTRAMUSCULAR | Status: DC | PRN

## 2023-04-14 MED ORDER — OXYCODONE HCL 5 MG PO TABS
5.0000 mg | ORAL_TABLET | ORAL | Status: DC | PRN
  Administered 2023-04-16: 5 mg via ORAL
  Filled 2023-04-14: qty 1

## 2023-04-14 MED ORDER — METHOCARBAMOL 1000 MG/10ML IJ SOLN
500.0000 mg | Freq: Three times a day (TID) | INTRAVENOUS | Status: DC
  Filled 2023-04-14 (×2): qty 5

## 2023-04-14 MED ORDER — IOHEXOL 350 MG/ML SOLN
100.0000 mL | Freq: Once | INTRAVENOUS | Status: AC | PRN
  Administered 2023-04-14: 100 mL via INTRAVENOUS

## 2023-04-14 MED ORDER — METHOCARBAMOL 1000 MG/10ML IJ SOLN: 500.0000 mg | Freq: Four times a day (QID) | INTRAVENOUS | Status: DC | PRN

## 2023-04-14 MED ORDER — KETOROLAC TROMETHAMINE 15 MG/ML IJ SOLN
15.0000 mg | Freq: Three times a day (TID) | INTRAMUSCULAR | Status: DC
  Administered 2023-04-14 – 2023-04-16 (×4): 15 mg via INTRAVENOUS
  Filled 2023-04-14 (×5): qty 1

## 2023-04-14 NOTE — ED Notes (Signed)
Attempted to call floor 3x with no answer.Marland KitchenMarland Kitchen

## 2023-04-14 NOTE — ED Notes (Signed)
Pt in ct 
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
## 2023-04-14 NOTE — ED Notes (Signed)
Pt in bed, family at bedside, pt states that her pain is starting to get worse, pt requests pain med, md notified
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
## 2023-04-14 NOTE — ED Notes (Signed)
Pt to ct and xray
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
## 2023-04-15 LAB — GLUCOSE, CAPILLARY
Glucose-Capillary: 209 mg/dL — ABNORMAL HIGH (ref 70–99)
Glucose-Capillary: 285 mg/dL — ABNORMAL HIGH (ref 70–99)
Glucose-Capillary: 287 mg/dL — ABNORMAL HIGH (ref 70–99)
Glucose-Capillary: 232 mg/dL — ABNORMAL HIGH (ref 70–99)
Glucose-Capillary: 255 mg/dL — ABNORMAL HIGH (ref 70–99)

## 2023-04-15 LAB — BASIC METABOLIC PANEL
Anion gap: 8 (ref 5–15)
BUN: 11 mg/dL (ref 8–23)
CO2: 26 mmol/L (ref 22–32)
Calcium: 8.6 mg/dL — ABNORMAL LOW (ref 8.9–10.3)
Chloride: 102 mmol/L (ref 98–111)
Creatinine, Ser: 0.67 mg/dL (ref 0.44–1.00)
GFR, Estimated: >60 mL/min (ref >60)
Glucose, Bld: 182 mg/dL — ABNORMAL HIGH (ref 70–99)
Potassium: 3.6 mmol/L (ref 3.5–5.1)
Sodium: 136 mmol/L (ref 135–145)

## 2023-04-15 LAB — CBC
HCT: 39.3 % (ref 36.0–46.0)
Hemoglobin: 13 g/dL (ref 12.0–15.0)
MCH: 31.1 pg (ref 26.0–34.0)
MCHC: 33.1 g/dL (ref 30.0–36.0)
MCV: 94 fL (ref 80.0–100.0)
Platelets: 193 10*3/uL (ref 150–400)
RBC: 4.18 MIL/uL (ref 3.87–5.11)
RDW: 11.9 % (ref 11.5–15.5)
WBC: 11.4 10*3/uL — ABNORMAL HIGH (ref 4.0–10.5)
nRBC: 0 % (ref 0.0–0.2)

## 2023-04-15 NOTE — Inpatient Diabetes Management (Signed)
Inpatient Diabetes Program Recommendations  AACE/ADA: New Consensus Statement on Inpatient Glycemic Control (2015)  Target Ranges:  Prepandial:   less than 140 mg/dL      Peak postprandial:   less than 180 mg/dL (1-2 hours)      Critically ill patients:  140 - 180 mg/dL   Lab Results  Component Value Date   GLUCAP 285 (H) 04/15/2023   HGBA1C 8.9 (H) 04/14/2023    Review of Glycemic Control  Diabetes history: DM2 Outpatient Diabetes medications: Jardiance 25 QD, Trulicity 3 mg weekly Current orders for Inpatient glycemic control: Novolog 0-15 TID with meals and 0-5 HS  HgbA1C - 8.9% (increased from 7.4% on 02/02/23)  CBGs: 209, 285  Inpatient Diabetes Program Recommendations:    Consider adding low-dose basal - Semglee 8 units QHS  Add Novolog 2 units TID with meals if eating > 50%  F/U with PCP regarding diabetes management after discharge.  Diabetes Coordinator to see in am.  Follow.  Thank you. Ailene Ards, RD, LDN, CDCES Inpatient Diabetes Coordinator [phone]

## 2023-04-15 NOTE — Progress Notes (Signed)
Orthopedic Tech Progress Note Patient Details:  Brianna Leon [facility] 27-Jan-1956 [id number]  Sling for LUE tubed to [address], PT confirmed arrival.  Ortho Devices Type of Ortho Device: Sling immobilizer Ortho Device/Splint Location: For the LUE Ortho Device/Splint Interventions: Ordered (sent to tube station 83 (6N))      Docia Furl 04/15/2023, 11:56 AM
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
## 2023-04-16 LAB — BASIC METABOLIC PANEL
Anion gap: 8 (ref 5–15)
BUN: 14 mg/dL (ref 8–23)
CO2: 28 mmol/L (ref 22–32)
Calcium: 8.2 mg/dL — ABNORMAL LOW (ref 8.9–10.3)
Chloride: 98 mmol/L (ref 98–111)
Creatinine, Ser: 0.82 mg/dL (ref 0.44–1.00)
GFR, Estimated: >60 mL/min (ref >60)
Glucose, Bld: 225 mg/dL — ABNORMAL HIGH (ref 70–99)
Potassium: 3.4 mmol/L — ABNORMAL LOW (ref 3.5–5.1)
Sodium: 134 mmol/L — ABNORMAL LOW (ref 135–145)

## 2023-04-16 LAB — CBC
HCT: 36.3 % (ref 36.0–46.0)
Hemoglobin: 12.2 g/dL (ref 12.0–15.0)
MCH: 32.4 pg (ref 26.0–34.0)
MCHC: 33.6 g/dL (ref 30.0–36.0)
MCV: 96.3 fL (ref 80.0–100.0)
Platelets: 156 10*3/uL (ref 150–400)
RBC: 3.77 MIL/uL — ABNORMAL LOW (ref 3.87–5.11)
RDW: 11.7 % (ref 11.5–15.5)
WBC: 7.9 10*3/uL (ref 4.0–10.5)
nRBC: 0 % (ref 0.0–0.2)

## 2023-04-16 LAB — GLUCOSE, CAPILLARY: Glucose-Capillary: 233 mg/dL — ABNORMAL HIGH (ref 70–99)

## 2023-04-16 LAB — MAGNESIUM: Magnesium: 2.3 mg/dL (ref 1.7–2.4)

## 2023-04-16 MED ORDER — INSULIN GLARGINE-YFGN 100 UNIT/ML ~~LOC~~ SOLN
8.0000 [IU] | Freq: Every day | SUBCUTANEOUS | Status: DC
  Filled 2023-04-16: qty 0.08

## 2023-04-16 MED ORDER — OXYCODONE HCL 5 MG PO TABS: 5.0000 mg | ORAL_TABLET | Freq: Four times a day (QID) | ORAL | 0 refills | Status: DC | PRN

## 2023-04-16 MED ORDER — ACETAMINOPHEN 500 MG PO TABS: 1000.0000 mg | ORAL_TABLET | Freq: Four times a day (QID) | ORAL | 0 refills | Status: DC

## 2023-04-16 MED ORDER — METHOCARBAMOL 500 MG PO TABS: 750.0000 mg | ORAL_TABLET | Freq: Four times a day (QID) | ORAL | 0 refills | Status: DC | PRN

## 2023-04-16 MED ORDER — POTASSIUM CHLORIDE 20 MEQ PO PACK
40.0000 meq | PACK | Freq: Once | ORAL | Status: AC
  Administered 2023-04-16: 40 meq via ORAL
  Filled 2023-04-16: qty 2

## 2023-04-16 MED ORDER — INSULIN ASPART 100 UNIT/ML IJ SOLN: 2.0000 [IU] | Freq: Three times a day (TID) | INTRAMUSCULAR | Status: DC

## 2023-04-16 MED ORDER — LIDOCAINE 5 % EX PTCH: 1.0000 | MEDICATED_PATCH | CUTANEOUS | 0 refills | Status: DC

## 2023-04-16 MED ORDER — DOCUSATE SODIUM 100 MG PO CAPS: 100.0000 mg | ORAL_CAPSULE | Freq: Two times a day (BID) | ORAL | 0 refills | Status: DC
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
## 2023-04-16 NOTE — Discharge Instructions (Addendum)
Call orthopedics for follow up clavicle fracture in 2 weeks. Continue to wear sling and do not use your left arm to bear weight  RIB FRACTURES  HOME INSTRUCTIONS   PAIN CONTROL:  Pain is best controlled by a usual combination of three different methods TOGETHER:  Ice/Heat Over the counter pain medication Prescription pain medication You may experience some swelling and bruising in area of broken ribs. Ice packs or heating pads (30-60 minutes up to 6 times a day) will help. Use ice for the first few days to help decrease swelling and bruising, then switch to heat to help relax tight/sore spots and speed recovery. Some people prefer to use ice alone, heat alone, alternating between ice & heat. Experiment to what works for you. Swelling and bruising can take several weeks to resolve.  It is helpful to take an over-the-counter pain medication regularly for the first few weeks. Choose one of the following that works best for you:  Naproxen (Aleve, etc) Two 220mg  tabs twice a day Ibuprofen (Advil, etc) Three 200mg  tabs four times a day (every meal & bedtime) Acetaminophen (Tylenol, etc) 500-650mg  four times a day (every meal & bedtime) A prescription for pain medication (such as oxycodone, hydrocodone, etc) may be given to you upon discharge. Take your pain medication as prescribed.  If you are having problems/concerns with the prescription medicine (does not control pain, nausea, vomiting, rash, itching, etc), please call us [phone] to see if we need to switch you to a different pain medicine that will work better for you and/or control your side effect better. If you need a refill on your pain medication, please contact your pharmacy. They will contact our office to request authorization. Prescriptions will not be filled after 5 pm or on week-ends. Avoid getting constipated. When taking pain medications, it is common to experience some constipation. Increasing fluid intake and taking a fiber  supplement (such as Metamucil, Citrucel, FiberCon, MiraLax, etc) 1-2 times a day regularly will usually help prevent this problem from occurring. A mild laxative (prune juice, Milk of Magnesia, MiraLax, etc) should be taken according to package directions if there are no bowel movements after 48 hours.  Watch out for diarrhea. If you have many loose bowel movements, simplify your diet to bland foods & liquids for a few days. Stop any stool softeners and decrease your fiber supplement. Switching to mild anti-diarrheal medications (Kayopectate, Pepto Bismol) can help. If this worsens or does not improve, please call us. FOLLOW UP  If a follow up appointment is needed one will be scheduled for you. If none is needed with our trauma team, please follow up with your primary care provider within 2-3 weeks from discharge. Please call [facility] at [phone] if you have any questions about follow up.  If you have any orthopedic or other injuries you will need to follow up as outlined in your follow up instructions.   WHEN TO CALL us [phone]:  Poor pain control Reactions / problems with new medications (rash/itching, nausea, etc)  Fever over 101.5 F (38.5 C) Worsening swelling or bruising Worsening pain, productive cough, difficulty breathing or any other concerning symptoms  The clinic staff is available to answer your questions during regular business hours (8:30am-5pm). Please don't hesitate to call and ask to speak to one of our nurses for clinical concerns.  If you have a medical emergency, go to the nearest emergency room or call 911.  A surgeon from [facility] is always  on call at the [facility], [place]  [address], [place], [place] [address] ?  MAIN: [phone] ? TOLL FREE: [phone] ?  FAX [phone]  [email or web address]      Information on Rib Fractures  A rib fracture is a break or crack in one of the  bones of the ribs. The ribs are long, curved bones that wrap around your chest and attach to your spine and your breastbone. The ribs protect your heart, lungs, and other organs in the chest. A broken or cracked rib is often painful but is not usually serious. Most rib fractures heal on their own over time. However, rib fractures can be more serious if multiple ribs are broken or if broken ribs move out of place and push against other structures or organs. What are the causes? This condition is caused by: Repetitive movements with high force, such as pitching a baseball or having severe coughing spells. A direct blow to the chest, such as a sports injury, a car accident, or a fall. Cancer that has spread to the bones, which can weaken bones and cause them to break. What are the signs or symptoms? Symptoms of this condition include: Pain when you breathe in or cough. Pain when someone presses on the injured area. Feeling short of breath. How is this diagnosed? This condition is diagnosed with a physical exam and medical history. Imaging tests may also be done, such as: Chest X-ray. CT scan. MRI. Bone scan. Chest ultrasound. How is this treated? Treatment for this condition depends on the severity of the fracture. Most rib fractures usually heal on their own in 1-3 months. Sometimes healing takes longer if there is a cough that does not stop or if there are other activities that make the injury worse (aggravating factors). While you heal, you will be given medicines to control the pain. You will also be taught deep breathing exercises. Severe injuries may require hospitalization or surgery. Follow these instructions at home: Managing pain, stiffness, and swelling If directed, apply ice to the injured area. Put ice in a plastic bag. Place a towel between your skin and the bag. Leave the ice on for 20 minutes, 2-3 times a day. Take over-the-counter and prescription medicines only as told by  your health care provider. Activity Avoid a lot of activity and any activities or movements that cause pain. Be careful during activities and avoid bumping the injured rib. Slowly increase your activity as told by your health care provider. General instructions Do deep breathing exercises as told by your health care provider. This helps prevent pneumonia, which is a common complication of a broken rib. Your health care provider may instruct you to: Take deep breaths several times a day. Try to cough several times a day, holding a pillow against the injured area. Use a device called incentive spirometer to practice deep breathing several times a day. Drink enough fluid to keep your urine pale yellow. Do not wear a rib belt or binder. These restrict breathing, which can lead to pneumonia. Keep all follow-up visits as told by your health care provider. This is important. Contact a health care provider if: You have a fever. Get help right away if: You have difficulty breathing or you are short of breath. You develop a cough that does not stop, or you cough up thick or bloody sputum. You have nausea, vomiting, or pain in your abdomen. Your pain gets worse and medicine does  not help. Summary A rib fracture is a break or crack in one of the bones of the ribs. A broken or cracked rib is often painful but is not usually serious. Most rib fractures heal on their own over time. Treatment for this condition depends on the severity of the fracture. Avoid a lot of activity and any activities or movements that cause pain. This information is not intended to replace advice given to you by your health care provider. Make sure you discuss any questions you have with your health care provider. Document Released: 10/23/2005 Document Revised: 01/22/2017 Document Reviewed: 01/22/2017 Elsevier Interactive Patient Education  2019 [employer].
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
## 2023-04-16 NOTE — TOC CAGE-AID Note (Signed)
Transition of Care [facility]) - CAGE-AID Screening   Patient Details  Name: Brianna Leon MRN: [id number] Date of Birth: 26-May-1956  Transition of Care [facility]) CM/SW Contact:    Leota Sauers, RN Phone Number: 04/16/2023, 12:34 AM   Clinical Narrative:  Patient denies use of alcohol and illicit drugs. Education not offered at this time.  CAGE-AID Screening:    Have You Ever Felt You Ought to Cut Down on Your Drinking or Drug Use?: No Have People Annoyed You By Critizing Your Drinking Or Drug Use?: No Have You Felt Bad Or Guilty About Your Drinking Or Drug Use?: No Have You Ever Had a Drink or Used Drugs First Thing In The Morning to Steady Your Nerves or to Get Rid of a Hangover?: No CAGE-AID Score: 0  Substance Abuse Education Offered: No
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
## 2023-04-16 NOTE — Care Management Important Message (Signed)
Important Message  Patient Details  Name: Brianna Leon MRN: [id number] Date of Birth: 1956-04-27   Medicare Important Message Given:  Yes     Sherilyn Banker 04/16/2023, 2:54 PM
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
## 2023-04-16 NOTE — Plan of Care (Signed)
  Problem: Pain Managment: Goal: General experience of comfort will improve Outcome: Progressing   Problem: Skin Integrity: Goal: Risk for impaired skin integrity will decrease Outcome: Progressing   

## 2023-04-16 NOTE — Plan of Care (Signed)

## 2023-04-16 NOTE — Progress Notes (Addendum)
Discharge instructions reviewed with pt and her husband.  Copy of instructions given to pt. Pt informed new scripts sent to her pharmacy for pick up. IS sent home with pt, encouraged to use and taught how to do splinting with a pillow or a folded towel to fx ribs side when taking deep breathes and using IS to lesson the pain, expressed importance of deep breathing to prevent pneumonia. Pt and husband verbalized understanding.  Pt d/c'd via wheelchair with belongings, with husband.           Escorted by staff.  Brazil Voytko,RN SWOT

## 2023-04-17 ENCOUNTER — Telehealth: Payer: Self-pay | Admitting: *Deleted

## 2023-04-17 DIAGNOSIS — N8111 Cystocele, midline: Secondary | ICD-10-CM | POA: Diagnosis not present

## 2023-04-17 NOTE — Transitions of Care (Post Inpatient/ED Visit) (Signed)
04/17/2023  Name: Brianna Leon MRN: [id number] DOB: Feb 03, 1956  Today's TOC FU Call Status: Today's TOC FU Call Status:: Successful TOC FU Call Competed TOC FU Call Complete Date: 04/17/23  Transition Care Management Follow-up Telephone Call Date of Discharge: 04/16/23 Discharge Facility: [facility]) Type of Discharge: Inpatient Admission Primary Inpatient Discharge Diagnosis:: Multiple fractures How have you been since you were released from the hospital?: Better (Still having some pain, but the pain medication is helping) Any questions or concerns?: No  Items Reviewed: Did you receive and understand the discharge instructions provided?: Yes Medications obtained,verified, and reconciled?: Yes (Medications Reviewed) Any new allergies since your discharge?: No Dietary orders reviewed?: No Do you have support at home?: Yes People in Home: spouse Name of Support/Comfort Primary Source: Fayrene Fearing  Medications Reviewed Today: Medications Reviewed Today     Reviewed by Luella Cook, RN (Case Manager) on 04/17/23 at 1041  Med List Status: <None>   Medication Order Taking? Sig Documenting Provider Last Dose Status Informant  acetaminophen (TYLENOL) 500 MG tablet [id number] Yes Take 2 tablets (1,000 mg total) by mouth every 6 (six) hours. Adam Phenix, PA-C Taking Active   Ascorbic Acid (CHEWABLE VITAMIN C PO) [id number] Yes Take by mouth daily. [provider] Taking Active   aspirin EC 81 MG tablet [id number] Yes Take 81 mg by mouth daily. [provider] Taking Active Self  blood glucose meter kit and supplies KIT [id number]  Dispense based on patient and insurance preference. Use up to four times daily as directed. (FOR ICD-9 250.00, 250.01). Minna Antis, MD  Active Self  chlorhexidine (PERIDEX) 0.12 % solution [id number]  by Mouth Rinse route in the morning and at bedtime. [provider]  Active   docusate sodium (COLACE) 100 MG capsule  [id number] Yes Take 1 capsule (100 mg total) by mouth 2 (two) times daily. Adam Phenix, PA-C Taking Active   Dulaglutide (TRULICITY) 3 MG/0.5ML SOPN [id number] Yes INJECT 3 MG UNDER THE SKIN ONCE A WEEK Johnson, Megan P, DO Taking Active   empagliflozin (JARDIANCE) 25 MG TABS tablet [id number] Yes TAKE 1 TABLET(25 MG) BY MOUTH DAILY Johnson, Megan P, DO Taking Active   EPINEPHrine 0.3 mg/0.3 mL IJ SOAJ injection [id number]  INJECT INTRAMUSCULARLY AS DIRECTED Olevia Perches P, DO  Active   escitalopram (LEXAPRO) 20 MG tablet [id number] Yes TAKE 1 TABLET(20 MG) BY MOUTH DAILY Johnson, Megan P, DO Taking Active   fexofenadine (ALLEGRA) 180 MG tablet [id number] Yes Take 180 mg by mouth daily. [provider] Taking Active   ibuprofen (ADVIL) 600 MG tablet [id number] Yes Take 600 mg by mouth every 6 (six) hours as needed. [provider] Taking Active   lidocaine (LIDODERM) 5 % [id number] Yes Place 1-2 patches onto the skin daily. Remove & Discard patch within 12 hours or as directed by MD Adam Phenix, PA-C Taking Active   LORazepam (ATIVAN) 0.5 MG tablet [id number] Yes Take 1 tablet (0.5 mg total) by mouth 2 (two) times daily as needed for anxiety. Laural Benes, Megan P, DO Taking Active   methocarbamol (ROBAXIN) 500 MG tablet [id number] Yes Take 1.5 tablets (750 mg total) by mouth every 6 (six) hours as needed (use for muscle cramps/pain). Adam Phenix, PA-C Taking Active   Multiple Vitamins-Minerals (OCUVITE ADULT 50+ PO) [id number] Yes Take 1 capsule by mouth daily. [provider] Taking Active Self  Multiple Vitamins-Minerals (ZINC PO) [id number] Yes Take by mouth daily. [provider] Taking Active Self  omeprazole (PRILOSEC) 20 MG capsule [id number] Yes Take 1 capsule (20 mg total) by mouth daily. Laural Benes, Megan P, DO Taking Active   ondansetron (ZOFRAN-ODT) 4 MG disintegrating tablet [id number] Yes Take 1 tablet (4 mg total) by mouth every 8 (eight) hours as  needed for nausea or vomiting. Sharman Cheek, MD Taking Active   oxyCODONE (OXY IR/ROXICODONE) 5 MG immediate release tablet [id number] Yes Take 1 tablet (5 mg total) by mouth every 6 (six) hours as needed for moderate pain. Adam Phenix, [place] Taking Active   simvastatin (ZOCOR) 40 MG tablet [id number] Yes TAKE 1 TABLET(40 MG) BY MOUTH DAILY AT 6 PM Johnson, [place], DO Taking Active   UNABLE TO FIND [id number]  Take by mouth daily. Vitamin D3, vitamin B, Bioflex [provider]  Active Self  UNABLE TO FIND [id number]  Take by mouth daily. Ocutive eye vitamins, cinnamon vitamins [provider]  Active Self            Home Care and Equipment/Supplies: Were Home Health Services Ordered?: No Any new equipment or medical supplies ordered?: No  Functional Questionnaire: Do you need assistance with bathing/showering or dressing?: Yes Do you need assistance with meal preparation?: Yes Do you need assistance with eating?: No Do you have difficulty maintaining continence: No Do you need assistance with getting out of bed/getting out of a chair/moving?: No Do you have difficulty managing or taking your medications?: No  Follow up appointments reviewed: PCP Follow-up appointment confirmed?: Yes Date of PCP follow-up appointment?: 04/19/23 Follow-up Provider: Dr Olevia Perches 11:20 Specialist [facility] Follow-up appointment confirmed?: Yes Date of Specialist follow-up appointment?: 05/01/23 Follow-Up Specialty Provider:: Dr Jena Gauss Do you need transportation to your follow-up appointment?: No Do you understand care options if your condition(s) worsen?: Yes-patient verbalized understanding  SDOH Interventions Today    Flowsheet Row Most Recent Value  SDOH Interventions   Food Insecurity Interventions Intervention Not Indicated  Housing Interventions Intervention Not Indicated  Transportation Interventions Intervention Not Indicated      Interventions Today     Flowsheet Row Most Recent Value  General Interventions   General Interventions Discussed/Reviewed General Interventions Discussed, General Interventions Reviewed, Doctor Visits  Doctor Visits Discussed/Reviewed Doctor Visits Discussed, Doctor Visits Reviewed  Pharmacy Interventions   Pharmacy Dicussed/Reviewed Pharmacy Topics Discussed      TOC Interventions Today    Flowsheet Row Most Recent Value  TOC Interventions   TOC Interventions Discussed/Reviewed TOC Interventions Discussed, TOC Interventions Reviewed       Gean Maidens BSN RN [facility] [phone]

## 2023-04-18 ENCOUNTER — Other Ambulatory Visit: Payer: BC Managed Care – PPO

## 2023-04-19 ENCOUNTER — Ambulatory Visit (INDEPENDENT_AMBULATORY_CARE_PROVIDER_SITE_OTHER): Payer: Medicare Other | Admitting: Family Medicine

## 2023-04-19 ENCOUNTER — Encounter: Payer: Self-pay | Admitting: Family Medicine

## 2023-04-19 VITALS — BP 128/75 | HR 84 | Temp 97.7°F | Ht 62.0 in | Wt 124.6 lb

## 2023-04-19 DIAGNOSIS — S025XXB Fracture of tooth (traumatic), initial encounter for open fracture: Secondary | ICD-10-CM | POA: Diagnosis not present

## 2023-04-19 DIAGNOSIS — S42002D Fracture of unspecified part of left clavicle, subsequent encounter for fracture with routine healing: Secondary | ICD-10-CM

## 2023-04-19 DIAGNOSIS — S2242XD Multiple fractures of ribs, left side, subsequent encounter for fracture with routine healing: Secondary | ICD-10-CM

## 2023-04-19 DIAGNOSIS — E1129 Type 2 diabetes mellitus with other diabetic kidney complication: Secondary | ICD-10-CM

## 2023-04-19 DIAGNOSIS — Z7984 Long term (current) use of oral hypoglycemic drugs: Secondary | ICD-10-CM

## 2023-04-19 DIAGNOSIS — R809 Proteinuria, unspecified: Secondary | ICD-10-CM

## 2023-04-19 DIAGNOSIS — I700 Atherosclerosis of aorta: Secondary | ICD-10-CM
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
## 2023-04-20 ENCOUNTER — Encounter: Payer: Self-pay | Admitting: Family Medicine

## 2023-04-20 DIAGNOSIS — I700 Atherosclerosis of aorta: Secondary | ICD-10-CM | POA: Insufficient documentation

## 2023-04-20 MED ORDER — OMEPRAZOLE 20 MG PO CPDR: 20.0000 mg | DELAYED_RELEASE_CAPSULE | Freq: Every day | ORAL | 3 refills | Status: DC

## 2023-04-20 MED ORDER — RYBELSUS 3 MG PO TABS: 3.0000 mg | ORAL_TABLET | Freq: Every day | ORAL | 0 refills | Status: DC

## 2023-04-20 MED ORDER — SIMVASTATIN 40 MG PO TABS: ORAL_TABLET | ORAL | 1 refills | Status: DC

## 2023-04-20 MED ORDER — RYBELSUS 7 MG PO TABS
7.0000 mg | ORAL_TABLET | Freq: Every day | ORAL | 1 refills | Status: DC
Start: 2023-04-20 — End: 2023-07-30

## 2023-04-20 MED ORDER — CLINDAMYCIN HCL 300 MG PO CAPS: 300.0000 mg | ORAL_CAPSULE | Freq: Three times a day (TID) | ORAL | 0 refills | Status: DC

## 2023-04-20 MED ORDER — ESCITALOPRAM OXALATE 20 MG PO TABS: ORAL_TABLET | ORAL | 1 refills | Status: DC

## 2023-04-20 MED ORDER — EMPAGLIFLOZIN 25 MG PO TABS: ORAL_TABLET | ORAL | 1 refills | Status: DC

## 2023-04-20 NOTE — Assessment & Plan Note (Signed)
Has not been taking her trulicity regularly as she forgets. Will change from trulicity to rybelsus and recheck in 3 months. Call with any concerns.

## 2023-04-20 NOTE — Assessment & Plan Note (Signed)
Will keep her BP, cholesterol and sugars under good control. Continue to monitor. Call with any concerns.

## 2023-05-01 ENCOUNTER — Telehealth: Payer: Self-pay | Admitting: Family Medicine

## 2023-05-01 DIAGNOSIS — S42002A Fracture of unspecified part of left clavicle, initial encounter for closed fracture: Secondary | ICD-10-CM | POA: Diagnosis not present

## 2023-05-01 NOTE — Telephone Encounter (Signed)
Called dentist to see exactly what is it is they are needed, Stated that it is a clearance form I informed them that we have sent documentation over rep states the haven't received it and stated that they will send it over again but pt must pick it up today as her signature is required.    Will wait on the fax.
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
## 2023-05-01 NOTE — Telephone Encounter (Signed)
We sent them the note when she was seen- which stated that she was cleared for dental work. They stated they did not have a form. Please call and find out EXACTLY what they need

## 2023-05-02 NOTE — Telephone Encounter (Signed)
Form received from [employer]. Form was signed by Dr. Laural Benes and faxed back to them.

## 2023-05-03 ENCOUNTER — Ambulatory Visit: Payer: Medicare Other | Admitting: Occupational Therapy

## 2023-05-03 ENCOUNTER — Ambulatory Visit: Payer: Medicare Other | Admitting: Physical Therapy

## 2023-05-04 ENCOUNTER — Ambulatory Visit: Payer: Medicare Other | Admitting: Physical Therapy

## 2023-05-04 ENCOUNTER — Ambulatory Visit: Payer: Medicare Other | Admitting: Family Medicine

## 2023-05-04 ENCOUNTER — Ambulatory Visit: Payer: Medicare Other

## 2023-05-07 ENCOUNTER — Encounter: Payer: Medicare Other | Admitting: Occupational Therapy

## 2023-05-07 ENCOUNTER — Encounter: Payer: Medicare Other | Admitting: Physical Therapy

## 2023-05-07 DIAGNOSIS — N8111 Cystocele, midline: Secondary | ICD-10-CM | POA: Diagnosis not present

## 2023-05-07 DIAGNOSIS — E119 Type 2 diabetes mellitus without complications: Secondary | ICD-10-CM | POA: Diagnosis not present

## 2023-05-09 ENCOUNTER — Encounter: Payer: Medicare Other | Admitting: Physical Therapy

## 2023-05-09 ENCOUNTER — Encounter: Payer: Medicare Other | Admitting: Occupational Therapy

## 2023-05-14 ENCOUNTER — Encounter: Payer: Medicare Other | Admitting: Occupational Therapy

## 2023-05-14 ENCOUNTER — Encounter: Payer: Medicare Other | Admitting: Physical Therapy

## 2023-05-16 ENCOUNTER — Encounter: Payer: Medicare Other | Admitting: Occupational Therapy

## 2023-05-16 ENCOUNTER — Encounter: Payer: Medicare Other | Admitting: Physical Therapy

## 2023-05-22 ENCOUNTER — Encounter: Payer: Medicare Other | Admitting: Physical Therapy

## 2023-05-23 ENCOUNTER — Encounter: Payer: Medicare Other | Admitting: Occupational Therapy

## 2023-05-24 ENCOUNTER — Encounter: Payer: Medicare Other | Admitting: Occupational Therapy

## 2023-05-28 ENCOUNTER — Encounter: Payer: Medicare Other | Admitting: Occupational Therapy

## 2023-05-28 ENCOUNTER — Encounter: Payer: Medicare Other | Admitting: Physical Therapy

## 2023-05-31 ENCOUNTER — Encounter: Payer: Medicare Other | Admitting: Occupational Therapy

## 2023-05-31 DIAGNOSIS — H2513 Age-related nuclear cataract, bilateral: Secondary | ICD-10-CM | POA: Diagnosis not present

## 2023-05-31 DIAGNOSIS — H353131 Nonexudative age-related macular degeneration, bilateral, early dry stage: Secondary | ICD-10-CM | POA: Diagnosis not present

## 2023-05-31 DIAGNOSIS — H35363 Drusen (degenerative) of macula, bilateral: Secondary | ICD-10-CM | POA: Diagnosis not present

## 2023-05-31 DIAGNOSIS — E119 Type 2 diabetes mellitus without complications: Secondary | ICD-10-CM | POA: Diagnosis not present

## 2023-05-31 LAB — HM DIABETES EYE EXAM

## 2023-06-04 ENCOUNTER — Encounter: Payer: Medicare Other | Admitting: Occupational Therapy

## 2023-06-04 DIAGNOSIS — Z4689 Encounter for fitting and adjustment of other specified devices: Secondary | ICD-10-CM | POA: Diagnosis not present

## 2023-06-05 ENCOUNTER — Encounter: Payer: Medicare Other | Admitting: Physical Therapy

## 2023-06-06 ENCOUNTER — Encounter: Payer: Medicare Other | Admitting: Occupational Therapy

## 2023-06-07 ENCOUNTER — Encounter: Payer: Medicare Other | Admitting: Occupational Therapy

## 2023-06-07 DIAGNOSIS — E119 Type 2 diabetes mellitus without complications: Secondary | ICD-10-CM | POA: Diagnosis not present

## 2023-06-11 ENCOUNTER — Encounter: Payer: Medicare Other | Admitting: Occupational Therapy

## 2023-06-11 DIAGNOSIS — S42002D Fracture of unspecified part of left clavicle, subsequent encounter for fracture with routine healing: Secondary | ICD-10-CM | POA: Diagnosis not present

## 2023-06-12 ENCOUNTER — Encounter: Payer: Medicare Other | Admitting: Physical Therapy

## 2023-06-14 ENCOUNTER — Encounter: Payer: Medicare Other | Admitting: Occupational Therapy

## 2023-06-18 ENCOUNTER — Encounter: Payer: Medicare Other | Admitting: Occupational Therapy

## 2023-06-19 ENCOUNTER — Encounter: Payer: Medicare Other | Admitting: Physical Therapy

## 2023-06-20 ENCOUNTER — Encounter: Payer: Medicare Other | Admitting: Occupational Therapy

## 2023-06-21 ENCOUNTER — Encounter: Payer: Medicare Other | Admitting: Occupational Therapy

## 2023-06-25 ENCOUNTER — Encounter: Payer: Medicare Other | Admitting: Occupational Therapy

## 2023-06-26 ENCOUNTER — Encounter: Payer: Medicare Other | Admitting: Physical Therapy

## 2023-06-27 ENCOUNTER — Encounter: Payer: Self-pay | Admitting: Family Medicine

## 2023-06-28 ENCOUNTER — Encounter: Payer: Medicare Other | Admitting: Occupational Therapy

## 2023-07-03 ENCOUNTER — Encounter: Payer: Medicare Other | Admitting: Physical Therapy

## 2023-07-05 ENCOUNTER — Encounter: Payer: Medicare Other | Admitting: Occupational Therapy

## 2023-07-08 DIAGNOSIS — E119 Type 2 diabetes mellitus without complications: Secondary | ICD-10-CM | POA: Diagnosis not present

## 2023-07-18 ENCOUNTER — Encounter: Payer: Medicare Other | Admitting: Physical Therapy

## 2023-07-20 ENCOUNTER — Encounter: Payer: Self-pay | Admitting: Family Medicine

## 2023-07-20 ENCOUNTER — Ambulatory Visit (INDEPENDENT_AMBULATORY_CARE_PROVIDER_SITE_OTHER): Payer: Medicare Other | Admitting: Family Medicine

## 2023-07-20 ENCOUNTER — Ambulatory Visit: Payer: Medicare Other | Admitting: Family Medicine

## 2023-07-20 VITALS — BP 125/80 | HR 86 | Temp 98.7°F | Wt 130.8 lb

## 2023-07-20 DIAGNOSIS — E1129 Type 2 diabetes mellitus with other diabetic kidney complication: Secondary | ICD-10-CM

## 2023-07-20 DIAGNOSIS — R809 Proteinuria, unspecified: Secondary | ICD-10-CM

## 2023-07-20 DIAGNOSIS — E782 Mixed hyperlipidemia: Secondary | ICD-10-CM | POA: Diagnosis not present

## 2023-07-20 DIAGNOSIS — Z23 Encounter for immunization: Secondary | ICD-10-CM

## 2023-07-20 DIAGNOSIS — Z7984 Long term (current) use of oral hypoglycemic drugs: Secondary | ICD-10-CM

## 2023-07-20 NOTE — Assessment & Plan Note (Signed)
Rechecking labs today. Await results. Treat as needed.  °

## 2023-07-20 NOTE — Progress Notes (Signed)
BP 125/80   Pulse 86   Temp 98.7 F (37.1 C) (Oral)   Wt 130 lb 12.8 oz (59.3 kg)   SpO2 98%   BMI 23.92 kg/m    Subjective:    Patient ID: Brianna Leon, female    DOB: 1956-01-17, 67 y.o.   MRN: [id number]  HPI: Brianna Leon is a 67 y.o. female  Chief Complaint  Patient presents with   Diabetes   DIABETES Hypoglycemic episodes:yes Polydipsia/polyuria: no Visual disturbance: no Chest pain: no Paresthesias: no Glucose Monitoring: yes  Accucheck frequency:  occasionally- highest has been 140-150 Taking Insulin?: no Blood Pressure Monitoring: not checking Retinal Examination: Not up to Date Foot Exam: Up to Date Diabetic Education: Completed Pneumovax: Up to Date Influenza: Up to Date Aspirin: no  HYPERLIPIDEMIA Hyperlipidemia status: excellent compliance Satisfied with current treatment?  no Side effects:  no Medication compliance: excellent compliance Past cholesterol meds: simvastatin Supplements: none Aspirin:  no The 10-year ASCVD risk score (Arnett DK, et al., 2019) is: 9.8%   Values used to calculate the score:     Age: 89 years     Sex: Female     Is Non-Hispanic African American: No     Diabetic: Yes     Tobacco smoker: No     Systolic Blood Pressure: 125 mmHg     Is BP treated: No     HDL Cholesterol: 41 mg/dL     Total Cholesterol: 151 mg/dL Chest pain:  no Coronary artery disease:  no   Relevant past medical, surgical, family and social history reviewed and updated as indicated. Interim medical history since our last visit reviewed. Allergies and medications reviewed and updated.  Review of Systems  Constitutional: Negative.   Respiratory: Negative.    Cardiovascular: Negative.   Gastrointestinal: Negative.   Musculoskeletal: Negative.   Psychiatric/Behavioral: Negative.      Per HPI unless specifically indicated above     Objective:    BP 125/80   Pulse 86   Temp 98.7 F (37.1 C) (Oral)   Wt 130 lb 12.8 oz (59.3 kg)    SpO2 98%   BMI 23.92 kg/m   Wt Readings from Last 3 Encounters:  07/20/23 130 lb 12.8 oz (59.3 kg)  04/19/23 124 lb 9.6 oz (56.5 kg)  04/10/23 124 lb 9 oz (56.5 kg)    Physical Exam Vitals and nursing note reviewed.  Constitutional:      General: She is not in acute distress.    Appearance: Normal appearance. She is normal weight. She is not ill-appearing, toxic-appearing or diaphoretic.  HENT:     Head: Normocephalic and atraumatic.     Right Ear: External ear normal.     Left Ear: External ear normal.     Nose: Nose normal.     Mouth/Throat:     Mouth: Mucous membranes are moist.     Pharynx: Oropharynx is clear.  Eyes:     General: No scleral icterus.       Right eye: No discharge.        Left eye: No discharge.     Extraocular Movements: Extraocular movements intact.     Conjunctiva/sclera: Conjunctivae normal.     Pupils: Pupils are equal, round, and reactive to light.  Cardiovascular:     Rate and Rhythm: Normal rate and regular rhythm.     Pulses: Normal pulses.     Heart sounds: Normal heart sounds. No murmur heard.    No friction rub.  No gallop.  Pulmonary:     Effort: Pulmonary effort is normal. No respiratory distress.     Breath sounds: Normal breath sounds. No stridor. No wheezing, rhonchi or rales.  Chest:     Chest wall: No tenderness.  Musculoskeletal:        General: Normal range of motion.     Cervical back: Normal range of motion and neck supple.  Skin:    General: Skin is warm and dry.     Capillary Refill: Capillary refill takes less than 2 seconds.     Coloration: Skin is not jaundiced or pale.     Findings: No bruising, erythema, lesion or rash.  Neurological:     General: No focal deficit present.     Mental Status: She is alert and oriented to person, place, and time. Mental status is at baseline.  Psychiatric:        Mood and Affect: Mood normal.        Behavior: Behavior normal.        Thought Content: Thought content normal.         Judgment: Judgment normal.     Results for orders placed or performed in visit on 06/27/23  HM DIABETES EYE EXAM  Result Value Ref Range   HM Diabetic Eye Exam No Retinopathy No Retinopathy      Assessment & Plan:   Problem List Items Addressed This Visit       Endocrine   DM (diabetes mellitus), type 2 (HCC) - Primary    Rechecking labs today. Await results. Treat as needed.       Relevant Orders   Hgb A1c w/o eAG   Comprehensive metabolic panel   CBC with Differential/Platelet   Lipid Panel w/o Chol/HDL Ratio     Other   Hyperlipidemia    Under good control on current regimen. Continue current regimen. Continue to monitor. Call with any concerns. Refills given. Labs drawn today.        Other Visit Diagnoses     Needs flu shot       Flu shot given today.   Relevant Orders   Flu Vaccine Trivalent High Dose (Fluad)        Follow up plan: Return in about 3 months (around 10/19/2023).

## 2023-07-20 NOTE — Assessment & Plan Note (Signed)
Under good control on current regimen. Continue current regimen. Continue to monitor. Call with any concerns. Refills given. Labs drawn today.   

## 2023-07-21 LAB — COMPREHENSIVE METABOLIC PANEL
ALT: 22 IU/L (ref 0–32)
AST: 23 IU/L (ref 0–40)
Albumin: 4.7 g/dL (ref 3.9–4.9)
Alkaline Phosphatase: 102 IU/L (ref 44–121)
BUN/Creatinine Ratio: 24 (ref 12–28)
BUN: 13 mg/dL (ref 8–27)
Bilirubin Total: 0.2 mg/dL (ref 0.0–1.2)
CO2: 26 mmol/L (ref 20–29)
Calcium: 9.4 mg/dL (ref 8.7–10.3)
Chloride: 99 mmol/L (ref 96–106)
Creatinine, Ser: 0.55 mg/dL — ABNORMAL LOW (ref 0.57–1.00)
Globulin, Total: 2.6 g/dL (ref 1.5–4.5)
Glucose: 156 mg/dL — ABNORMAL HIGH (ref 70–99)
Potassium: 4.4 mmol/L (ref 3.5–5.2)
Sodium: 141 mmol/L (ref 134–144)
Total Protein: 7.3 g/dL (ref 6.0–8.5)
eGFR: 102 mL/min/{1.73_m2} (ref >59)

## 2023-07-21 LAB — CBC WITH DIFFERENTIAL/PLATELET
Basophils Absolute: 0 10*3/uL (ref 0.0–0.2)
Basos: 0 %
EOS (ABSOLUTE): 0.2 10*3/uL (ref 0.0–0.4)
Eos: 2 %
Hematocrit: 39.9 % (ref 34.0–46.6)
Hemoglobin: 13.5 g/dL (ref 11.1–15.9)
Immature Grans (Abs): 0 10*3/uL (ref 0.0–0.1)
Immature Granulocytes: 0 %
Lymphocytes Absolute: 2.8 10*3/uL (ref 0.7–3.1)
Lymphs: 30 %
MCH: 32.1 pg (ref 26.6–33.0)
MCHC: 33.8 g/dL (ref 31.5–35.7)
MCV: 95 fL (ref 79–97)
Monocytes Absolute: 0.8 10*3/uL (ref 0.1–0.9)
Monocytes: 8 %
Neutrophils Absolute: 5.5 10*3/uL (ref 1.4–7.0)
Neutrophils: 60 %
Platelets: 206 10*3/uL (ref 150–450)
RBC: 4.2 x10E6/uL (ref 3.77–5.28)
RDW: 11.3 % — ABNORMAL LOW (ref 11.7–15.4)
WBC: 9.2 10*3/uL (ref 3.4–10.8)

## 2023-07-21 LAB — LIPID PANEL W/O CHOL/HDL RATIO
Cholesterol, Total: 178 mg/dL (ref 100–199)
HDL: 41 mg/dL (ref >39)
LDL Chol Calc (NIH): 103 mg/dL — ABNORMAL HIGH (ref 0–99)
Triglycerides: 197 mg/dL — ABNORMAL HIGH (ref 0–149)
VLDL Cholesterol Cal: 34 mg/dL (ref 5–40)

## 2023-07-21 LAB — HGB A1C W/O EAG: Hgb A1c MFr Bld: 7.4 % — ABNORMAL HIGH (ref 4.8–5.6)

## 2023-07-23 ENCOUNTER — Encounter: Payer: Medicare Other | Admitting: Physical Therapy

## 2023-07-23 ENCOUNTER — Ambulatory Visit
Admission: RE | Admit: 2023-07-23 | Discharge: 2023-07-23 | Disposition: A | Discharge status: Home / Self Care | Payer: Medicare Other | Source: Ambulatory Visit | Attending: Family Medicine | Admitting: Family Medicine

## 2023-07-23 DIAGNOSIS — E119 Type 2 diabetes mellitus without complications: Secondary | ICD-10-CM | POA: Diagnosis not present

## 2023-07-23 DIAGNOSIS — M85851 Other specified disorders of bone density and structure, right thigh: Secondary | ICD-10-CM | POA: Insufficient documentation

## 2023-07-23 DIAGNOSIS — Z13820 Encounter for screening for osteoporosis: Secondary | ICD-10-CM

## 2023-07-23 DIAGNOSIS — Z780 Asymptomatic menopausal state: Secondary | ICD-10-CM | POA: Diagnosis not present

## 2023-07-23 DIAGNOSIS — Z1231 Encounter for screening mammogram for malignant neoplasm of breast: Secondary | ICD-10-CM | POA: Insufficient documentation

## 2023-07-25 ENCOUNTER — Encounter: Payer: Medicare Other | Admitting: Physical Therapy

## 2023-07-25 ENCOUNTER — Encounter: Payer: Medicare Other | Admitting: Occupational Therapy

## 2023-07-26 ENCOUNTER — Other Ambulatory Visit: Payer: Self-pay | Admitting: Family Medicine

## 2023-07-29 ENCOUNTER — Encounter: Payer: Self-pay | Admitting: Family Medicine

## 2023-07-29 DIAGNOSIS — M8580 Other specified disorders of bone density and structure, unspecified site: Secondary | ICD-10-CM | POA: Insufficient documentation

## 2023-07-30 ENCOUNTER — Encounter: Payer: Medicare Other | Admitting: Physical Therapy

## 2023-07-30 DIAGNOSIS — S42002D Fracture of unspecified part of left clavicle, subsequent encounter for fracture with routine healing: Secondary | ICD-10-CM | POA: Diagnosis not present

## 2023-07-30 NOTE — Telephone Encounter (Signed)
Requested medications are due for refill today.  yes  Requested medications are on the active medications list.  yes  Last refill. 04/20/2023 #30 1 rf  Future visit scheduled.   yes  Notes to clinic.  Medication not assigned to a protocol. Please review for refill.    Requested Prescriptions  Pending Prescriptions Disp Refills   RYBELSUS 7 MG TABS [Pharmacy Med Name: RYBELSUS 7MG  TABLETS] 30 tablet 1    Sig: TAKE 1 TABLET(7 MG) BY MOUTH DAILY     Off-Protocol Failed - 07/26/2023  7:39 PM      Failed - Medication not assigned to a protocol, review manually.      Passed - Valid encounter within last 12 months    Recent Outpatient Visits           1 week ago Type 2 diabetes mellitus with diabetic microalbuminuria, without long-term current use of insulin (HCC)   [place] [facility] [place], Megan P, DO   3 months ago Closed displaced fracture of left clavicle with routine healing, unspecified part of clavicle, subsequent encounter   [facility] [place], Megan P, DO   5 months ago [facility] [employer] preventive visit   [place] [facility] [place], [place] P, DO   8 months ago Type 2 diabetes mellitus with microalbuminuria, without long-term current use of insulin [facility])   [facility] [place], Megan P, DO   12 months ago Benign hypertensive renal disease   [place] [facility] Dorcas Carrow, DO       Future Appointments             In 2 months Laural Benes, Oralia Rud, DO [place] [facility]

## 2023-08-01 ENCOUNTER — Ambulatory Visit: Payer: Medicare Other | Admitting: Physical Therapy

## 2023-08-06 ENCOUNTER — Ambulatory Visit: Payer: Medicare Other | Admitting: Physical Therapy

## 2023-08-07 DIAGNOSIS — E119 Type 2 diabetes mellitus without complications: Secondary | ICD-10-CM | POA: Diagnosis not present

## 2023-08-08 ENCOUNTER — Encounter: Payer: Medicare Other | Admitting: Occupational Therapy

## 2023-08-08 ENCOUNTER — Encounter: Payer: Medicare Other | Admitting: Physical Therapy

## 2023-08-13 ENCOUNTER — Encounter: Payer: Medicare Other | Admitting: Physical Therapy

## 2023-08-15 ENCOUNTER — Encounter: Payer: Medicare Other | Admitting: Physical Therapy

## 2023-08-20 ENCOUNTER — Encounter: Payer: Medicare Other | Admitting: Physical Therapy

## 2023-08-22 ENCOUNTER — Encounter: Payer: Medicare Other | Admitting: Physical Therapy

## 2023-08-27 ENCOUNTER — Encounter: Payer: Medicare Other | Admitting: Physical Therapy

## 2023-08-29 ENCOUNTER — Encounter: Payer: Medicare Other | Admitting: Physical Therapy

## 2023-09-03 ENCOUNTER — Encounter: Payer: Medicare Other | Admitting: Physical Therapy

## 2023-09-05 ENCOUNTER — Encounter: Payer: Medicare Other | Admitting: Physical Therapy

## 2023-09-07 DIAGNOSIS — E119 Type 2 diabetes mellitus without complications: Secondary | ICD-10-CM | POA: Diagnosis not present

## 2023-09-10 ENCOUNTER — Encounter: Payer: Medicare Other | Admitting: Physical Therapy

## 2023-09-12 ENCOUNTER — Encounter: Payer: Medicare Other | Admitting: Physical Therapy

## 2023-09-17 ENCOUNTER — Encounter: Payer: Medicare Other | Admitting: Physical Therapy

## 2023-09-19 ENCOUNTER — Encounter: Payer: Medicare Other | Admitting: Physical Therapy

## 2023-09-21 DIAGNOSIS — Z4689 Encounter for fitting and adjustment of other specified devices: Secondary | ICD-10-CM | POA: Diagnosis not present

## 2023-09-21 DIAGNOSIS — N8111 Cystocele, midline: Secondary | ICD-10-CM | POA: Diagnosis not present

## 2023-09-24 ENCOUNTER — Encounter: Payer: Medicare Other | Admitting: Physical Therapy

## 2023-09-26 ENCOUNTER — Encounter: Payer: Medicare Other | Admitting: Physical Therapy

## 2023-10-19 ENCOUNTER — Ambulatory Visit (INDEPENDENT_AMBULATORY_CARE_PROVIDER_SITE_OTHER): Payer: Medicare Other | Admitting: Family Medicine

## 2023-10-19 ENCOUNTER — Encounter: Payer: Self-pay | Admitting: Family Medicine

## 2023-10-19 VITALS — BP 120/75 | HR 80 | Wt 133.2 lb

## 2023-10-19 DIAGNOSIS — R1310 Dysphagia, unspecified: Secondary | ICD-10-CM

## 2023-10-19 DIAGNOSIS — M542 Cervicalgia: Secondary | ICD-10-CM

## 2023-10-19 DIAGNOSIS — E1129 Type 2 diabetes mellitus with other diabetic kidney complication: Secondary | ICD-10-CM | POA: Diagnosis not present

## 2023-10-19 DIAGNOSIS — R079 Chest pain, unspecified: Secondary | ICD-10-CM

## 2023-10-19 DIAGNOSIS — R809 Proteinuria, unspecified: Secondary | ICD-10-CM | POA: Diagnosis not present

## 2023-10-19 DIAGNOSIS — Z7984 Long term (current) use of oral hypoglycemic drugs: Secondary | ICD-10-CM

## 2023-10-19 DIAGNOSIS — K219 Gastro-esophageal reflux disease without esophagitis: Secondary | ICD-10-CM

## 2023-10-19 LAB — BAYER DCA HB A1C WAIVED: HB A1C (BAYER DCA - WAIVED): 7.2 % — ABNORMAL HIGH (ref 4.8–5.6)

## 2023-10-19 MED ORDER — METHOCARBAMOL 500 MG PO TABS: 750.0000 mg | ORAL_TABLET | Freq: Four times a day (QID) | ORAL | 0 refills | Status: DC | PRN

## 2023-10-19 MED ORDER — OMEPRAZOLE 40 MG PO CPDR: 40.0000 mg | DELAYED_RELEASE_CAPSULE | Freq: Every day | ORAL | 0 refills | Status: DC

## 2023-10-19 MED ORDER — ESCITALOPRAM OXALATE 20 MG PO TABS: ORAL_TABLET | ORAL | 1 refills | Status: DC

## 2023-10-19 MED ORDER — EMPAGLIFLOZIN 25 MG PO TABS: ORAL_TABLET | ORAL | 1 refills | Status: DC

## 2023-10-19 MED ORDER — SIMVASTATIN 40 MG PO TABS: ORAL_TABLET | ORAL | 1 refills | Status: DC

## 2023-10-19 NOTE — Assessment & Plan Note (Signed)
Stable with A1c of 7.2 down from 7.4. Continue current regimen. Continue to monitor. Call with any concerns.

## 2023-10-19 NOTE — Progress Notes (Signed)
BP 120/75   Pulse 80   Wt 133 lb 3.2 oz (60.4 kg)   SpO2 96%   BMI 24.36 kg/m    Subjective:    Patient ID: Brianna Leon, female    DOB: 1956-06-10, 67 y.o.   MRN: [id number]  HPI: Brianna Leon is a 67 y.o. female  Chief Complaint  Patient presents with   Diabetes   NECK PAIN  Duration: couple of months, worse in the past month Status: uncontrolled Location: lower part of her neck  Severity: severe Quality: sharp, dull, aching Frequency: intermittent, more often then not Radiation: headache and behind her shoulder blades Aggravating factors: laying flat in the bed Alleviating factors: tylenol, ibuprofen Weakness:  yes Paresthesias / decreased sensation:  no  Fevers:  no  DYSPHAGIA Duration: 5-6 months Description of symptom: food getting stuck Onset: Several seconds after swallowing Location of dysphagia: throat Dysphagia to solids only: no Dysphagia to solids & liquids: yes  Frequency: 1-2x a week   Progressively getting worse: yes Alleviatiating factors: vomiting Provoking factors: pillsr, not chewing, and certain foods Status: worse EGD: no Weight loss: no Sensation of lump in throat: no Heartburn: no Odynophagia: no Nausea: yes Vomiting: yes Drooling/nasal regurgitation/food spillage: no Coughing: yes Choking/dysphonia: no Dysarthria: no Hematemesis: no Regurgitation of undigested food/halitosis: no Chest pain: no  DIABETES Hypoglycemic episodes:no Polydipsia/polyuria: yes Visual disturbance: no Chest pain: no Paresthesias: no Glucose Monitoring: yes  Accucheck frequency: Daily  Fasting glucose: 150s-200s  Taking Insulin?: no Blood Pressure Monitoring: not checking Retinal Examination: Up to Date Foot Exam: Up to Date Diabetic Education: Completed Pneumovax: Up to Date Influenza: Up to Date Aspirin: yes  ANXIETY/DEPRESSION- feels like she is not doing well because of how she's feeling Duration:  Chronic Status:uncontrolled Anxious mood: yes  Excessive worrying: no Irritability: no  Sweating: no Nausea: yes Palpitations:no Hyperventilation: no Panic attacks: no Agoraphobia: no  Obscessions/compulsions: no Depressed mood: yes    10/19/2023    3:51 PM 07/20/2023    3:23 PM 04/19/2023   11:32 AM 02/02/2023    2:39 PM 11/03/2022    2:37 PM  Depression screen PHQ 2/9  Decreased Interest 1 0 0 0 0  Down, Depressed, Hopeless 2 0 0 0 0  PHQ - 2 Score 3 0 0 0 0  Altered sleeping 2 0 0 0 3  Tired, decreased energy 2 0 0 0 3  Change in appetite 1 0 0 0 0  Feeling bad or failure about yourself  1 0 0 0 0  Trouble concentrating 1  0 0 0  Moving slowly or fidgety/restless 1 0 0 0 0  Suicidal thoughts 0 0 0 0 0  PHQ-9 Score 11 0 0 0 6  Difficult doing work/chores Somewhat difficult Not difficult at all Not difficult at all Not difficult at all Not difficult at all   Anhedonia: no Weight changes: no Insomnia: no   Hypersomnia: no Fatigue/loss of energy: yes Feelings of worthlessness: no Feelings of guilt: no Impaired concentration/indecisiveness: no Suicidal ideations: no  Crying spells: no Recent Stressors/Life Changes: yes   Relationship problems: no   Family stress: yes     Financial stress: no    Job stress: yes    Recent death/loss: no   Relevant past medical, surgical, family and social history reviewed and updated as indicated. Interim medical history since our last visit reviewed. Allergies and medications reviewed and updated.  Review of Systems  Constitutional:  Positive for fatigue. Negative for  activity change, appetite change, chills, diaphoresis, fever and unexpected weight change.  Respiratory: Negative.    Cardiovascular: Negative.   Gastrointestinal:  Positive for abdominal distention, nausea and vomiting. Negative for abdominal pain, anal bleeding, blood in stool, constipation, diarrhea and rectal pain.  Musculoskeletal:  Positive for arthralgias,  myalgias, neck pain and neck stiffness. Negative for back pain, gait problem and joint swelling.  Skin: Negative.   Neurological: Negative.   Psychiatric/Behavioral:  Positive for dysphoric mood. Negative for agitation, behavioral problems, confusion, decreased concentration, hallucinations, self-injury, sleep disturbance and suicidal ideas. The patient is nervous/anxious. The patient is not hyperactive.     Per HPI unless specifically indicated above     Objective:    BP 120/75   Pulse 80   Wt 133 lb 3.2 oz (60.4 kg)   SpO2 96%   BMI 24.36 kg/m   Wt Readings from Last 3 Encounters:  10/19/23 133 lb 3.2 oz (60.4 kg)  07/20/23 130 lb 12.8 oz (59.3 kg)  04/19/23 124 lb 9.6 oz (56.5 kg)    Physical Exam Vitals and nursing note reviewed.  Constitutional:      General: She is not in acute distress.    Appearance: Normal appearance. She is normal weight. She is not ill-appearing, toxic-appearing or diaphoretic.  HENT:     Head: Normocephalic and atraumatic.     Right Ear: External ear normal.     Left Ear: External ear normal.     Nose: Nose normal.     Mouth/Throat:     Mouth: Mucous membranes are moist.     Pharynx: Oropharynx is clear.  Eyes:     General: No scleral icterus.       Right eye: No discharge.        Left eye: No discharge.     Extraocular Movements: Extraocular movements intact.     Conjunctiva/sclera: Conjunctivae normal.     Pupils: Pupils are equal, round, and reactive to light.  Cardiovascular:     Rate and Rhythm: Normal rate and regular rhythm.     Pulses: Normal pulses.     Heart sounds: Normal heart sounds. No murmur heard.    No friction rub. No gallop.  Pulmonary:     Effort: Pulmonary effort is normal. No respiratory distress.     Breath sounds: Normal breath sounds. No stridor. No wheezing, rhonchi or rales.  Chest:     Chest wall: No tenderness.  Musculoskeletal:        General: Normal range of motion.     Cervical back: Normal range of  motion and neck supple.  Skin:    General: Skin is warm and dry.     Capillary Refill: Capillary refill takes less than 2 seconds.     Coloration: Skin is not jaundiced or pale.     Findings: No bruising, erythema, lesion or rash.  Neurological:     General: No focal deficit present.     Mental Status: She is alert and oriented to person, place, and time. Mental status is at baseline.  Psychiatric:        Mood and Affect: Mood normal.        Behavior: Behavior normal.        Thought Content: Thought content normal.        Judgment: Judgment normal.     Results for orders placed or performed in visit on 10/19/23  Bayer DCA Hb A1c Waived   Collection Time: 10/19/23  3:51 PM  Result  Value Ref Range   HB A1C (BAYER DCA - WAIVED) 7.2 (H) 4.8 - 5.6 %      Assessment & Plan:   Problem List Items Addressed This Visit       Digestive   GERD (gastroesophageal reflux disease)   Having chest pain and dysphagia. Will increase her omeprazole to 40mg  and get her into GI. Call with any concerns.       Relevant Medications   omeprazole (PRILOSEC) 40 MG capsule     Endocrine   DM (diabetes mellitus), type 2 (HCC) - Primary   Stable with A1c of 7.2 down from 7.4. Continue current regimen. Continue to monitor. Call with any concerns.       Relevant Medications   empagliflozin (JARDIANCE) 25 MG TABS tablet   simvastatin (ZOCOR) 40 MG tablet   Other Relevant Orders   Bayer DCA Hb A1c Waived (Completed)   Other Visit Diagnoses       Neck pain       Likely exacerbated with her clavicle fracture. Will get her x-rays of neck and shoulder, start methocarbamol and refer to PT. Recheck 1 month. Call with concern   Relevant Orders   DG Cervical Spine Complete   DG Shoulder Left   Ambulatory referral to Physical Therapy     Dysphagia, unspecified type       Will refer to GI. Needs EGD. Will increase her omeprazole to 40mg . Call with any concerns. Await results.   Relevant Orders    Ambulatory referral to Gastroenterology     Chest pain, unspecified type       Likely Gut related. Repeat EKG normal. Will increase omeprazole and get her into GI. Call with any concerns.   Relevant Orders   EKG 12-Lead (Completed)   EKG 12-Lead   EKG 12-Lead        Follow up plan: Return in about 4 weeks (around 11/16/2023).

## 2023-10-19 NOTE — Assessment & Plan Note (Signed)
Having chest pain and dysphagia. Will increase her omeprazole to 40mg  and get her into GI. Call with any concerns.

## 2023-10-19 NOTE — Progress Notes (Signed)
EKG with poor baseline. Will repeat.

## 2023-11-16 ENCOUNTER — Ambulatory Visit: Payer: Medicare Other | Admitting: Family Medicine

## 2023-12-19 ENCOUNTER — Ambulatory Visit (INDEPENDENT_AMBULATORY_CARE_PROVIDER_SITE_OTHER): Payer: PPO | Admitting: Family Medicine

## 2023-12-19 ENCOUNTER — Encounter: Payer: Self-pay | Admitting: Family Medicine

## 2023-12-19 VITALS — BP 138/74 | HR 90 | Temp 98.5°F | Resp 15 | Ht 62.01 in | Wt 130.8 lb

## 2023-12-19 DIAGNOSIS — K219 Gastro-esophageal reflux disease without esophagitis: Secondary | ICD-10-CM | POA: Diagnosis not present

## 2023-12-19 DIAGNOSIS — J4541 Moderate persistent asthma with (acute) exacerbation: Secondary | ICD-10-CM | POA: Diagnosis not present

## 2023-12-19 DIAGNOSIS — M542 Cervicalgia: Secondary | ICD-10-CM | POA: Diagnosis not present

## 2023-12-19 DIAGNOSIS — R1310 Dysphagia, unspecified: Secondary | ICD-10-CM | POA: Diagnosis not present

## 2023-12-19 MED ORDER — BUDESONIDE-FORMOTEROL FUMARATE 160-4.5 MCG/ACT IN AERO: 2.0000 | INHALATION_SPRAY | Freq: Two times a day (BID) | RESPIRATORY_TRACT | 3 refills | Status: DC

## 2023-12-19 MED ORDER — ALBUTEROL SULFATE HFA 108 (90 BASE) MCG/ACT IN AERS: 2.0000 | INHALATION_SPRAY | Freq: Four times a day (QID) | RESPIRATORY_TRACT | 0 refills | Status: AC | PRN

## 2023-12-19 NOTE — Patient Instructions (Addendum)
[facility] Physical Therapy [address] [place] [address] [phone]

## 2023-12-19 NOTE — Assessment & Plan Note (Signed)
Due to see GI on Friday. Continue omeprazole. Await their input. Call with any concerns.

## 2023-12-19 NOTE — Progress Notes (Signed)
BP 138/74 (BP Location: Left Arm, Patient Position: Sitting, Cuff Size: Normal)   Pulse 90   Temp 98.5 F (36.9 C) (Oral)   Resp 15   Ht 5' 2.01" (1.575 m)   Wt 130 lb 12.8 oz (59.3 kg)   SpO2 97%   BMI 23.92 kg/m    Subjective:    Patient ID: Brianna Leon, female    DOB: Mar 30, 1956, 68 y.o.   MRN: [id number]  HPI: Brianna Leon is a 68 y.o. female  Chief Complaint  Patient presents with   Clavicle Injury    No PT. No call to schedule    GI Problem    Appt is Friday 12/21/2023. Dose increase has helped slightly   Cough    Started week before last with not feeling well. All week before last week and last weekend feeling sick, flu like symptoms. Still has productive cough. Chest pressure/pain last week. Worse with deep breathes and felt she was wheezing. Minor stomach upsets.    UPPER RESPIRATORY TRACT INFECTION Duration: 2 weeks Worst symptom: achey, fever, cough, runny nose Fever: yes Cough: yes Shortness of breath: yes Wheezing: yes Chest pain: yes, with cough Chest tightness: yes Chest congestion: yes Nasal congestion: yes Runny nose: yes Post nasal drip: yes Sneezing: no Sore throat: yes Swollen glands: no Sinus pressure: no Headache: no Face pain: no Toothache: no Ear pain: no  Ear pressure: no  Eyes red/itching:no Eye drainage/crusting: no  Vomiting: no Rash: no Fatigue: yes Sick contacts: no Strep contacts: no  Context: better Recurrent sinusitis: no Relief with OTC cold/cough medications: no  Treatments attempted: none   GERD- still throwing up after eating at least 1x a day, has been a bit better GERD control status: very slightly better Satisfied with current treatment? better Heartburn frequency: occasionally- has been much better Medication side effects: no  Medication compliance: good Dysphagia: yes Odynophagia:  no Hematemesis: no Blood in stool: no EGD: no  NECK PAIN  Duration: about 4 months, feeling a bit  better Severity: moderate Quality: dull, aching Frequency: intermittent Radiation: headache, below her shoulder blades Aggravating factors: laying down flat in bed Alleviating factors: tylenol, ibuprofen, zofran, muscle relaxer Weakness:  yes Paresthesias / decreased sensation:  no  Fevers:  no  Relevant past medical, surgical, family and social history reviewed and updated as indicated. Interim medical history since our last visit reviewed. Allergies and medications reviewed and updated.  Review of Systems  Constitutional: Negative.   HENT:  Positive for congestion, postnasal drip and rhinorrhea. Negative for dental problem, drooling, ear discharge, ear pain, facial swelling, hearing loss, mouth sores, nosebleeds, sinus pressure, sinus pain, sneezing, sore throat, tinnitus, trouble swallowing and voice change.   Respiratory:  Positive for cough, chest tightness, shortness of breath and wheezing. Negative for apnea, choking and stridor.   Cardiovascular: Negative.   Gastrointestinal: Negative.   Skin: Negative.   Psychiatric/Behavioral: Negative.      Per HPI unless specifically indicated above     Objective:    BP 138/74 (BP Location: Left Arm, Patient Position: Sitting, Cuff Size: Normal)   Pulse 90   Temp 98.5 F (36.9 C) (Oral)   Resp 15   Ht 5' 2.01" (1.575 m)   Wt 130 lb 12.8 oz (59.3 kg)   SpO2 97%   BMI 23.92 kg/m   Wt Readings from Last 3 Encounters:  12/19/23 130 lb 12.8 oz (59.3 kg)  10/19/23 133 lb 3.2 oz (60.4 kg)  07/20/23 130  lb 12.8 oz (59.3 kg)    Physical Exam Vitals and nursing note reviewed.  Constitutional:      General: She is not in acute distress.    Appearance: Normal appearance. She is not ill-appearing, toxic-appearing or diaphoretic.  HENT:     Head: Normocephalic and atraumatic.     Right Ear: External ear normal.     Left Ear: External ear normal.     Nose: Nose normal.     Mouth/Throat:     Mouth: Mucous membranes are moist.      Pharynx: Oropharynx is clear.  Eyes:     General: No scleral icterus.       Right eye: No discharge.        Left eye: No discharge.     Extraocular Movements: Extraocular movements intact.     Conjunctiva/sclera: Conjunctivae normal.     Pupils: Pupils are equal, round, and reactive to light.  Cardiovascular:     Rate and Rhythm: Normal rate and regular rhythm.     Pulses: Normal pulses.     Heart sounds: Normal heart sounds. No murmur heard.    No friction rub. No gallop.  Pulmonary:     Effort: Pulmonary effort is normal. No respiratory distress.     Breath sounds: Normal breath sounds. No stridor. No wheezing, rhonchi or rales.  Chest:     Chest wall: No tenderness.  Musculoskeletal:        General: Normal range of motion.     Cervical back: Normal range of motion and neck supple.  Skin:    General: Skin is warm and dry.     Capillary Refill: Capillary refill takes less than 2 seconds.     Coloration: Skin is not jaundiced or pale.     Findings: No bruising, erythema, lesion or rash.  Neurological:     General: No focal deficit present.     Mental Status: She is alert and oriented to person, place, and time. Mental status is at baseline.  Psychiatric:        Mood and Affect: Mood normal.        Behavior: Behavior normal.        Thought Content: Thought content normal.        Judgment: Judgment normal.     Results for orders placed or performed in visit on 10/19/23  Bayer DCA Hb A1c Waived   Collection Time: 10/19/23  3:51 PM  Result Value Ref Range   HB A1C (BAYER DCA - WAIVED) 7.2 (H) 4.8 - 5.6 %      Assessment & Plan:   Problem List Items Addressed This Visit       Digestive   GERD (gastroesophageal reflux disease)   Due to see GI on Friday. Continue omeprazole. Await their input. Call with any concerns.       Other Visit Diagnoses       Moderate persistent reactive airway disease with acute exacerbation    -  Primary   Will treat with symbicort and  albuterol. Call with any concerns. Continue to monitor.   Relevant Medications   budesonide-formoterol (SYMBICORT) 160-4.5 MCG/ACT inhaler   albuterol (VENTOLIN HFA) 108 (90 Base) MCG/ACT inhaler     Neck pain       Improving.  Has not started PT yet- their information provided today. Call with any concerns or if not getting better.     Dysphagia, unspecified type       Due to see GI  on Friday. Continue omeprazole. Await their input. Call with any concerns.        Follow up plan: Return in about 6 weeks (around 01/30/2024) for physical.

## 2023-12-21 ENCOUNTER — Ambulatory Visit: Payer: Medicare Other | Admitting: Family Medicine

## 2023-12-21 ENCOUNTER — Ambulatory Visit (INDEPENDENT_AMBULATORY_CARE_PROVIDER_SITE_OTHER): Payer: PPO | Admitting: Physician Assistant

## 2023-12-21 ENCOUNTER — Encounter: Payer: Self-pay | Admitting: Physician Assistant

## 2023-12-21 VITALS — BP 138/74 | HR 92 | Temp 97.7°F | Ht 62.0 in | Wt 128.8 lb

## 2023-12-21 DIAGNOSIS — R1310 Dysphagia, unspecified: Secondary | ICD-10-CM

## 2023-12-21 DIAGNOSIS — K219 Gastro-esophageal reflux disease without esophagitis: Secondary | ICD-10-CM

## 2023-12-21 NOTE — Progress Notes (Signed)
Celso Amy, PA-C [address]  [address]  [place], [place] [address]  Main: [phone]  Fax: [phone]   Primary Care Physician: Dorcas Carrow, DO  Primary Gastroenterologist:  Celso Amy, PA-C / Dr. [name]    CC: Dysphagia  HPI: Brianna Leon is a 68 y.o. female, established patient Dr. Tobi Bastos, presents to evaluate dysphagia.  History of GERD.  Currently taking Omeprazole 40mg  once daily with good control of acid reflux.  She has had solid food dysphagia for 5-6 months, getting more frequent and progressive.  Occurs most days of the week.  Had epiosde of food bolus and vomited [place] food a few weeks ago.  She has to chew her food really well and she eats very slowly.  Denies aspiration, weight loss, abdominal pain, or other GI symptoms.    06/2020 Screening Colonoscopy by Dr. Tobi Bastos:  One small 4mm colon mucosa cecal polyp removed.    Current Outpatient Medications  Medication Sig Dispense Refill   acetaminophen (TYLENOL) 500 MG tablet Take 2 tablets (1,000 mg total) by mouth every 6 (six) hours. 30 tablet 0   albuterol (VENTOLIN HFA) 108 (90 Base) MCG/ACT inhaler Inhale 2 puffs into the lungs every 6 (six) hours as needed for wheezing or shortness of breath. 8 g 0   Ascorbic Acid (CHEWABLE VITAMIN C PO) Take by mouth daily.     aspirin EC 81 MG tablet Take 81 mg by mouth daily.     blood glucose meter kit and supplies KIT Dispense based on patient and insurance preference. Use up to four times daily as directed. (FOR ICD-9 250.00, 250.01). 1 each 0   budesonide-formoterol (SYMBICORT) 160-4.5 MCG/ACT inhaler Inhale 2 puffs into the lungs 2 (two) times daily. 1 each 3   chlorhexidine (PERIDEX) 0.12 % solution by Mouth Rinse route in the morning and at bedtime.     docusate sodium (COLACE) 100 MG capsule Take 1 capsule (100 mg total) by mouth 2 (two) times daily. 10 capsule 0   empagliflozin (JARDIANCE) 25 MG TABS tablet TAKE 1 TABLET(25 MG) BY MOUTH DAILY 90 tablet  1   EPINEPHrine 0.3 mg/0.3 mL IJ SOAJ injection INJECT INTRAMUSCULARLY AS DIRECTED 1 each 12   escitalopram (LEXAPRO) 20 MG tablet TAKE 1 TABLET(20 MG) BY MOUTH DAILY 90 tablet 1   fexofenadine (ALLEGRA) 180 MG tablet Take 180 mg by mouth daily.     ibuprofen (ADVIL) 600 MG tablet Take 600 mg by mouth every 6 (six) hours as needed.     lidocaine (LIDODERM) 5 % Place 1-2 patches onto the skin daily. Remove & Discard patch within 12 hours or as directed by MD 30 patch 0   LORazepam (ATIVAN) 0.5 MG tablet Take 1 tablet (0.5 mg total) by mouth 2 (two) times daily as needed for anxiety. 20 tablet 1   methocarbamol (ROBAXIN) 500 MG tablet Take 1.5 tablets (750 mg total) by mouth every 6 (six) hours as needed (use for muscle cramps/pain). 40 tablet 0   Multiple Vitamins-Minerals (OCUVITE ADULT 50+ PO) Take 1 capsule by mouth daily.     Multiple Vitamins-Minerals (ZINC PO) Take by mouth daily.     omeprazole (PRILOSEC) 40 MG capsule Take 1 capsule (40 mg total) by mouth daily. 90 capsule 0   ondansetron (ZOFRAN-ODT) 4 MG disintegrating tablet Take 1 tablet (4 mg total) by mouth every 8 (eight) hours as needed for nausea or vomiting. 20 tablet 0   Semaglutide (RYBELSUS) 7 MG TABS TAKE  1 TABLET(7 MG) BY MOUTH DAILY 90 tablet 1   simvastatin (ZOCOR) 40 MG tablet TAKE 1 TABLET(40 MG) BY MOUTH DAILY AT 6 PM 90 tablet 1   UNABLE TO FIND Take by mouth daily. Vitamin D3, vitamin B, Bioflex     UNABLE TO FIND Take by mouth daily. Ocutive eye vitamins, cinnamon vitamins     No current facility-administered medications for this visit.    Allergies as of 12/21/2023 - Review Complete 12/21/2023  Allergen Reaction Noted   Carafate [sucralfate] Anaphylaxis and Hives 06/26/2017   Atorvastatin Other (See Comments) 04/01/2014   Metformin and related Diarrhea 04/06/2015   Other Hives and Other (See Comments) 06/11/2017   Penicillins Nausea And Vomiting 04/06/2015   Tramadol Nausea Only 04/01/2014    Past Medical  History:  Diagnosis Date   Arthritis    KNEES   Biliary colic 08/02/2017   CAD (coronary artery disease)    DM (diabetes mellitus), type 2 (HCC) 2000   insulin started 2016   Elevated liver enzymes    Family history of colonic polyps    father   GERD (gastroesophageal reflux disease)    Hemorrhage    DURING PREGNANCY 31 YEARS AGO   Hyperlipidemia    IBS (irritable bowel syndrome)    Migraine    RUQ pain 06/12/2017    Past Surgical History:  Procedure Laterality Date   CARDIAC CATHETERIZATION     x 2   CARPAL TUNNEL RELEASE Right    CHOLECYSTECTOMY N/A 09/20/2017   Procedure: LAPAROSCOPIC CHOLECYSTECTOMY WITH INTRAOPERATIVE CHOLANGIOGRAM;  Surgeon: Earline Mayotte, MD;  Location: [facility] ORS;  Service: General;  Laterality: N/A;   COLONOSCOPY  05/18/2015   repeat 5 years   COLONOSCOPY WITH PROPOFOL N/A 07/01/2020   Procedure: COLONOSCOPY WITH PROPOFOL;  Surgeon: [name], MD;  Location: [facility] ENDOSCOPY;  Service: Gastroenterology;  Laterality: N/A;   ESOPHAGOGASTRODUODENOSCOPY  05/18/2015   KNEE ARTHROSCOPY  2001 and 2009   TONSILLECTOMY     TOTAL ABDOMINAL HYSTERECTOMY  22 years ago    Review of Systems:    All systems reviewed and negative except where noted in HPI.   Physical Examination:   BP 138/74   Pulse 92   Temp 97.7 F (36.5 C)   Ht 5\' 2"  (1.575 m)   Wt 128 lb 12.8 oz (58.4 kg)   BMI 23.56 kg/m   General: Well-nourished, well-developed in no acute distress.  Lungs: Clear to auscultation bilaterally. Non-labored. Heart: Regular rate and rhythm, no murmurs rubs or gallops.  Abdomen: Bowel sounds are normal; Abdomen is Soft; No hepatosplenomegaly, masses or hernias;  No Abdominal Tenderness; No guarding or rebound tenderness. Neuro: Alert and oriented x 3.  Grossly intact.  Psych: Alert and cooperative, normal mood and affect.   Imaging Studies: No results found.  Assessment and Plan:   Brianna Leon is a 68 y.o. y/o female presents  for:  Dysphagia to Solid Foods: Evaluate for Esophageal Stricture, EOE  Barium Swallow with Tablet  Scheduling EGD with Possible Dilation I discussed risks of EGD w/ DIL with patient to include risk of bleeding, perforation, and risk of sedation.  Patient expressed understanding and agrees to proceed with EGD.   GERD  Continue Omeprazole 40mg  daily.  Avoid GERD Trigger Foods.  Celso Amy, PA-C  Follow up 2 months with DR. Anna for Dysphagia.

## 2023-12-24 ENCOUNTER — Ambulatory Visit
Admission: RE | Admit: 2023-12-24 | Discharge: 2023-12-24 | Disposition: A | Discharge status: Home / Self Care | Payer: PPO | Source: Ambulatory Visit | Attending: Physician Assistant | Admitting: Physician Assistant

## 2023-12-24 DIAGNOSIS — K224 Dyskinesia of esophagus: Secondary | ICD-10-CM | POA: Diagnosis not present

## 2023-12-24 DIAGNOSIS — K219 Gastro-esophageal reflux disease without esophagitis: Secondary | ICD-10-CM | POA: Diagnosis not present

## 2023-12-24 DIAGNOSIS — K449 Diaphragmatic hernia without obstruction or gangrene: Secondary | ICD-10-CM | POA: Diagnosis not present

## 2023-12-24 DIAGNOSIS — R4702 Dysphasia: Secondary | ICD-10-CM | POA: Diagnosis not present

## 2023-12-24 DIAGNOSIS — R1310 Dysphagia, unspecified: Secondary | ICD-10-CM | POA: Insufficient documentation

## 2023-12-25 ENCOUNTER — Telehealth: Payer: Self-pay

## 2023-12-25 MED ORDER — OMEPRAZOLE 40 MG PO CPDR: 40.0000 mg | DELAYED_RELEASE_CAPSULE | Freq: Every day | ORAL | 2 refills | Status: DC

## 2023-12-25 NOTE — Telephone Encounter (Signed)
   Patient notified and RX sent to pharmacy.  Notify patient barium swallow with tablet test shows:  1.  Moderate acid reflux to the level of the mid esophagus.  2.  Small hiatal hernia.  3.  Mild to moderate esophageal dysmotility (abnormal muscle contractions) in the lower esophagus.  4.  No evidence of esophageal masses or strictures.  I recommend continue with plan for EGD as scheduled.  Increase omeprazole to 40 mg twice daily (max of 3 months) to see if this helps upper GI symptoms.  Okay to send Rx omeprazole 40 Mg twice daily, #60, 2 refills.  Celso Amy, PA-C

## 2023-12-25 NOTE — Progress Notes (Signed)
 Notify patient barium swallow with tablet test shows: 1.  Moderate acid reflux to the level of the mid esophagus. 2.  Small hiatal hernia. 3.  Mild to moderate esophageal dysmotility (abnormal muscle contractions) in the lower esophagus. 4.  No evidence of esophageal masses or strictures. I recommend continue with plan for EGD as scheduled.  Increase omeprazole to 40 mg twice daily (max of 3 months) to see if this helps upper GI symptoms.  Okay to send Rx omeprazole 40 Mg twice daily, #60, 2 refills. Celso Amy, PA-C

## 2024-01-23 ENCOUNTER — Ambulatory Visit
Admission: RE | Admit: 2024-01-23 | Discharge: 2024-01-23 | Disposition: A | Discharge status: Home / Self Care | Payer: PPO | Attending: Gastroenterology | Admitting: Gastroenterology

## 2024-01-23 ENCOUNTER — Ambulatory Visit: Admitting: Anesthesiology

## 2024-01-23 ENCOUNTER — Emergency Department
Admission: EM | Admit: 2024-01-23 | Discharge: 2024-01-23 | Disposition: A | Discharge status: Home / Self Care | Source: Home / Self Care | Attending: Emergency Medicine | Admitting: Emergency Medicine

## 2024-01-23 ENCOUNTER — Other Ambulatory Visit: Payer: Self-pay

## 2024-01-23 ENCOUNTER — Encounter
Admission: RE | Disposition: A | Discharge status: Home / Self Care | Payer: Self-pay | Source: Home / Self Care | Attending: Gastroenterology

## 2024-01-23 ENCOUNTER — Telehealth: Payer: Self-pay

## 2024-01-23 DIAGNOSIS — Z5309 Procedure and treatment not carried out because of other contraindication: Secondary | ICD-10-CM | POA: Diagnosis not present

## 2024-01-23 DIAGNOSIS — N3000 Acute cystitis without hematuria: Secondary | ICD-10-CM | POA: Insufficient documentation

## 2024-01-23 DIAGNOSIS — E1165 Type 2 diabetes mellitus with hyperglycemia: Secondary | ICD-10-CM | POA: Diagnosis not present

## 2024-01-23 DIAGNOSIS — R1310 Dysphagia, unspecified: Secondary | ICD-10-CM | POA: Insufficient documentation

## 2024-01-23 DIAGNOSIS — K589 Irritable bowel syndrome without diarrhea: Secondary | ICD-10-CM | POA: Diagnosis not present

## 2024-01-23 DIAGNOSIS — K219 Gastro-esophageal reflux disease without esophagitis: Secondary | ICD-10-CM | POA: Insufficient documentation

## 2024-01-23 DIAGNOSIS — R739 Hyperglycemia, unspecified: Secondary | ICD-10-CM

## 2024-01-23 DIAGNOSIS — D72829 Elevated white blood cell count, unspecified: Secondary | ICD-10-CM | POA: Insufficient documentation

## 2024-01-23 HISTORY — PX: ESOPHAGOGASTRODUODENOSCOPY (EGD) WITH PROPOFOL: SHX5813

## 2024-01-23 LAB — COMPREHENSIVE METABOLIC PANEL
ALT: 61 U/L — ABNORMAL HIGH (ref 0–44)
AST: 61 U/L — ABNORMAL HIGH (ref 15–41)
Albumin: 3.9 g/dL (ref 3.5–5.0)
Alkaline Phosphatase: 60 U/L (ref 38–126)
Anion gap: 12 (ref 5–15)
BUN: 17 mg/dL (ref 8–23)
CO2: 27 mmol/L (ref 22–32)
Calcium: 9.7 mg/dL (ref 8.9–10.3)
Chloride: 98 mmol/L (ref 98–111)
Creatinine, Ser: 0.69 mg/dL (ref 0.44–1.00)
GFR, Estimated: >60 mL/min (ref >60)
Glucose, Bld: 479 mg/dL — ABNORMAL HIGH (ref 70–99)
Potassium: 3.9 mmol/L (ref 3.5–5.1)
Sodium: 137 mmol/L (ref 135–145)
Total Bilirubin: 0.9 mg/dL (ref 0.0–1.2)
Total Protein: 7.9 g/dL (ref 6.5–8.1)

## 2024-01-23 LAB — CBC WITH DIFFERENTIAL/PLATELET
Abs Immature Granulocytes: 0.03 10*3/uL (ref 0.00–0.07)
Basophils Absolute: 0.1 10*3/uL (ref 0.0–0.1)
Basophils Relative: 1 %
Eosinophils Absolute: 0.1 10*3/uL (ref 0.0–0.5)
Eosinophils Relative: 1 %
HCT: 42.8 % (ref 36.0–46.0)
Hemoglobin: 14.7 g/dL (ref 12.0–15.0)
Immature Granulocytes: 0 %
Lymphocytes Relative: 23 %
Lymphs Abs: 2.5 10*3/uL (ref 0.7–4.0)
MCH: 32.3 pg (ref 26.0–34.0)
MCHC: 34.3 g/dL (ref 30.0–36.0)
MCV: 94.1 fL (ref 80.0–100.0)
Monocytes Absolute: 0.7 10*3/uL (ref 0.1–1.0)
Monocytes Relative: 6 %
Neutro Abs: 7.6 10*3/uL (ref 1.7–7.7)
Neutrophils Relative %: 69 %
Platelets: 255 10*3/uL (ref 150–400)
RBC: 4.55 MIL/uL (ref 3.87–5.11)
RDW: 12 % (ref 11.5–15.5)
WBC: 10.9 10*3/uL — ABNORMAL HIGH (ref 4.0–10.5)
nRBC: 0 % (ref 0.0–0.2)

## 2024-01-23 LAB — URINALYSIS, ROUTINE W REFLEX MICROSCOPIC
Bilirubin Urine: NEGATIVE
Glucose, UA: >=500 mg/dL — AB
Hgb urine dipstick: NEGATIVE
Ketones, ur: 5 mg/dL — AB
Nitrite: NEGATIVE
Protein, ur: NEGATIVE mg/dL
Specific Gravity, Urine: 1.026 (ref 1.005–1.030)
WBC, UA: >50 WBC/hpf (ref 0–5)
pH: 7 (ref 5.0–8.0)

## 2024-01-23 LAB — GLUCOSE, CAPILLARY
Glucose-Capillary: 419 mg/dL — ABNORMAL HIGH (ref 70–99)
Glucose-Capillary: 466 mg/dL — ABNORMAL HIGH (ref 70–99)

## 2024-01-23 LAB — CBG MONITORING, ED
Glucose-Capillary: 426 mg/dL — ABNORMAL HIGH (ref 70–99)
Glucose-Capillary: 324 mg/dL — ABNORMAL HIGH (ref 70–99)
Glucose-Capillary: 267 mg/dL — ABNORMAL HIGH (ref 70–99)

## 2024-01-23 SURGERY — ESOPHAGOGASTRODUODENOSCOPY (EGD) WITH PROPOFOL
Anesthesia: General

## 2024-01-23 MED ORDER — CEPHALEXIN 500 MG PO CAPS: 500.0000 mg | ORAL_CAPSULE | Freq: Two times a day (BID) | ORAL | 0 refills | Status: AC

## 2024-01-23 MED ORDER — LANCETS MISC. MISC
1.0000 | Freq: Three times a day (TID) | 0 refills | Status: DC
Start: 2024-01-23 — End: 2024-02-08

## 2024-01-23 MED ORDER — BLOOD GLUCOSE TEST VI STRP: 1.0000 | ORAL_STRIP | Freq: Three times a day (TID) | 0 refills | Status: DC

## 2024-01-23 MED ORDER — SODIUM CHLORIDE 0.9 % IV BOLUS
1000.0000 mL | Freq: Once | INTRAVENOUS | Status: AC
  Administered 2024-01-23: 1000 mL via INTRAVENOUS

## 2024-01-23 MED ORDER — SODIUM CHLORIDE 0.9 % IV SOLN
INTRAVENOUS | Status: DC
Start: 2024-01-23 — End: 2024-01-23

## 2024-01-23 MED ORDER — LANCET DEVICE MISC
1.0000 | Freq: Three times a day (TID) | 0 refills | Status: AC
Start: 2024-01-23 — End: 2024-02-22

## 2024-01-23 MED ORDER — BLOOD GLUCOSE MONITORING SUPPL DEVI: 1.0000 | Freq: Three times a day (TID) | 0 refills | Status: DC

## 2024-01-23 MED ORDER — INSULIN ASPART 100 UNIT/ML IJ SOLN
5.0000 [IU] | Freq: Once | INTRAMUSCULAR | Status: AC
  Administered 2024-01-23: 5 [IU] via INTRAVENOUS
  Filled 2024-01-23: qty 1

## 2024-01-23 NOTE — Discharge Instructions (Addendum)
 You were seen in the ER for your elevated blood glucose.  Please make sure you are taking your medications as directed.  Your urine was concerning for infection.  I sent a prescription for an antibiotic to your pharmacy.  Please take this as directed.  Follow with your primary care doctor for further evaluation.  Contact your GI team to discuss when to reschedule your procedure.  Return to the ER for any new or worsening symptoms.

## 2024-01-23 NOTE — Progress Notes (Signed)
 Patient cancelled per anesthesia due to blood sugar of 466. Patient going to the ED for treatment of blood sugar.

## 2024-01-23 NOTE — ED Triage Notes (Signed)
 Pt states sent from endo due to hyperglycemia, pt was supposed to have a upper endoscopy and esophagus stretched but would not continue to due to blood sugar levels.   Pt came with 22G IV to R hand.

## 2024-01-23 NOTE — ED Provider Notes (Signed)
 [facility] Provider Note    Event Date/Time   First MD Initiated Contact with Patient 01/23/24 [phone]     (approximate)   History   Hyperglycemia   HPI  Brianna Leon is a 68 year old female with history of GERD, IBS, DM presenting to the ER for evaluation of hyperglycemia. Patient was scheduled for an endoscopy earlier today.  Prior to procedure, she was noted to be hyperglycemic with a blood glucose in the 400s.  Was sent to the ER in the setting of this.  Patient currently denies any complaints.  Does report that she held her diabetes medication last night as she was unsure if she should take them.  Daughter also notes that she has had some increased thirst, urinary frequency recently.  Is on Jardiance for her diabetes.     Physical Exam   Triage Vital Signs: ED Triage Vitals [01/23/24 0859]  Encounter Vitals Group     BP 130/70     Systolic BP Percentile      Diastolic BP Percentile      Pulse Rate 87     Resp 19     Temp 97.6 F (36.4 C)     Temp Source Oral     SpO2 95 %     Weight 124 lb (56.2 kg)     Height 5\' 2"  (1.575 m)     Head Circumference      Peak Flow      Pain Score 0     Pain Loc      Pain Education      Exclude from Growth Chart     Most recent vital signs: Vitals:   01/23/24 1000 01/23/24 1230  BP: 138/73 126/65  Pulse: 81 78  Resp: 15 17  Temp:  98.1 F (36.7 C)  SpO2: 94% 95%     General: Awake, interactive  CV:  Regular rate, good peripheral perfusion.  Resp:  Unlabored respirations, lungs clear to auscultation Abd:  Nondistended, soft, nontender to palpation Neuro:  Symmetric facial movement, fluid speech   ED Results / Procedures / Treatments   Labs (all labs ordered are listed, but only abnormal results are displayed) Labs Reviewed  CBC WITH DIFFERENTIAL/PLATELET - Abnormal; Notable for the following components:      Result Value   WBC 10.9 (*)    All other components within normal limits   COMPREHENSIVE METABOLIC PANEL - Abnormal; Notable for the following components:   Glucose, Bld 479 (*)    AST 61 (*)    ALT 61 (*)    All other components within normal limits  URINALYSIS, ROUTINE W REFLEX MICROSCOPIC - Abnormal; Notable for the following components:   Color, Urine STRAW (*)    APPearance CLOUDY (*)    Glucose, UA >=500 (*)    Ketones, ur 5 (*)    Leukocytes,Ua LARGE (*)    Bacteria, UA FEW (*)    All other components within normal limits  CBG MONITORING, ED - Abnormal; Notable for the following components:   Glucose-Capillary 426 (*)    All other components within normal limits  CBG MONITORING, ED - Abnormal; Notable for the following components:   Glucose-Capillary 324 (*)    All other components within normal limits  CBG MONITORING, ED - Abnormal; Notable for the following components:   Glucose-Capillary 267 (*)    All other components within normal limits  URINE CULTURE     EKG EKG independently reviewed interpreted by  myself (ER attending) demonstrates:    RADIOLOGY Imaging independently reviewed and interpreted by myself demonstrates:    PROCEDURES:  Critical Care performed: No  Procedures   MEDICATIONS ORDERED IN ED: Medications  sodium chloride 0.9 % bolus 1,000 mL (0 mLs Intravenous Stopped 01/23/24 1246)  insulin aspart (novoLOG) injection 5 Units (5 Units Intravenous Given 01/23/24 1214)     IMPRESSION / MDM / ASSESSMENT AND PLAN / ED COURSE  I reviewed the triage vital signs and the nursing notes.  Differential diagnosis includes, but is not limited to, hyperglycemia due to medication missed doses, lower suspicion DKA, HHS, infection  Patient's presentation is most consistent with acute presentation with potential threat to life or bodily function.  68 year old female presenting with elevated blood sugar.  Stable vitals.  Exam overall reassuring.  Labs with minimal leukocytosis.  Redemonstrates hyperglycemia here with glucose of 479,  but no evidence of DKA.  Urinalysis is concerning for infection.  With urinary frequency, will go ahead and treat.  Urine culture sent.  After IV fluids and insulin, patient did have improvement in her blood glucose to the 200s.  She is comfortable discharge home.  Strict return precautions provided.  Patient discharged stable condition.     FINAL CLINICAL IMPRESSION(S) / ED DIAGNOSES   Final diagnoses:  Hyperglycemia  Acute cystitis without hematuria     Rx / DC Orders   ED Discharge Orders          Ordered    cephALEXin (KEFLEX) 500 MG capsule  2 times daily        01/23/24 1333             Note:  This document was prepared using Dragon voice recognition software and may include unintentional dictation errors.   [name], MD 01/23/24 [phone]

## 2024-01-23 NOTE — Anesthesia Preprocedure Evaluation (Signed)
 Anesthesia Evaluation  Patient identified by MRN, date of birth, ID band Patient awake    Reviewed: Allergy & Precautions, NPO status , Patient's Chart, lab work & pertinent test results  History of Anesthesia Complications Negative for: history of anesthetic complications  Airway Mallampati: III  TM Distance: <3 FB Neck ROM: full    Dental  (+) Chipped   Pulmonary neg pulmonary ROS, neg shortness of breath   Pulmonary exam normal        Cardiovascular Exercise Tolerance: Good + angina  + CAD  Normal cardiovascular exam     Neuro/Psych  Headaches  negative psych ROS   GI/Hepatic Neg liver ROS,GERD  Controlled,,  Endo/Other  diabetes, Type 2    Renal/GU negative Renal ROS  negative genitourinary   Musculoskeletal   Abdominal   Peds  Hematology negative hematology ROS (+)   Anesthesia Other Findings Patient reports that they do not think that any food or pills are stuck in their throat at this time.  Past Medical History: No date: Arthritis     Comment:  KNEES 08/02/2017: Biliary colic No date: CAD (coronary artery disease) 2000: DM (diabetes mellitus), type 2 (HCC)     Comment:  insulin started 2016 No date: Elevated liver enzymes No date: Family history of colonic polyps     Comment:  father No date: GERD (gastroesophageal reflux disease) No date: Hemorrhage     Comment:  DURING PREGNANCY 31 YEARS AGO No date: Hyperlipidemia No date: IBS (irritable bowel syndrome) No date: Migraine 06/12/2017: RUQ pain  Past Surgical History: No date: CARDIAC CATHETERIZATION     Comment:  x 2 No date: CARPAL TUNNEL RELEASE; Right 09/20/2017: CHOLECYSTECTOMY; N/A     Comment:  Procedure: LAPAROSCOPIC CHOLECYSTECTOMY WITH               INTRAOPERATIVE CHOLANGIOGRAM;  Surgeon: Earline Mayotte, MD;  Location: [facility] ORS;  Service: General;                Laterality: N/A; 05/18/2015: COLONOSCOPY      Comment:  repeat 5 years 07/01/2020: COLONOSCOPY WITH PROPOFOL; N/A     Comment:  Procedure: COLONOSCOPY WITH PROPOFOL;  Surgeon: [name], MD;  Location: [facility] ENDOSCOPY;  Service:               Gastroenterology;  Laterality: N/A; 05/18/2015: ESOPHAGOGASTRODUODENOSCOPY 2001 and 2009: KNEE ARTHROSCOPY No date: TONSILLECTOMY 22 years ago: TOTAL ABDOMINAL HYSTERECTOMY  BMI    Body Mass Index: 22.68 kg/m      Reproductive/Obstetrics negative OB ROS                             Anesthesia Physical Anesthesia Plan  ASA: 3  Anesthesia Plan: General   Post-op Pain Management:    Induction: Intravenous  PONV Risk Score and Plan: Propofol infusion and TIVA  Airway Management Planned: Natural Airway and Nasal Cannula  Additional Equipment:   Intra-op Plan:   Post-operative Plan:   Informed Consent: I have reviewed the patients History and Physical, chart, labs and discussed the procedure including the risks, benefits and alternatives for the proposed anesthesia with the patient or authorized representative who has indicated his/her understanding and acceptance.     Dental Advisory Given  Plan Discussed with: Anesthesiologist, CRNA and Surgeon  Anesthesia Plan Comments: (Patient consented for risks of anesthesia including but not limited to:  - adverse reactions to medications - risk of airway placement if required - damage to eyes, teeth, lips or other oral mucosa - nerve damage due to positioning  - sore throat or hoarseness - Damage to heart, brain, nerves, lungs, other parts of body or loss of life  Patient voiced understanding and assent.)       Anesthesia Quick Evaluation

## 2024-01-23 NOTE — H&P (Signed)
 High glucose procedure cancelled

## 2024-01-23 NOTE — Telephone Encounter (Addendum)
 Patient called stating that she had her procedure scheduled for today but because her sugar level was too elevated. Therefore, her procedure was cancelled. She then stated that she was told to call us back so we could reschedule her procedure. Patient then requested to have it done on 01/28/2024. A new order was done. I reminded patient to hold her Jardiance and Trulicity prior to procedure.

## 2024-01-24 ENCOUNTER — Ambulatory Visit: Payer: Self-pay | Admitting: Family Medicine

## 2024-01-24 ENCOUNTER — Encounter: Payer: Self-pay | Admitting: Nurse Practitioner

## 2024-01-24 ENCOUNTER — Ambulatory Visit: Admitting: Nurse Practitioner

## 2024-01-24 VITALS — BP 110/62 | HR 87 | Temp 97.4°F | Resp 18 | Ht 62.0 in | Wt 127.7 lb

## 2024-01-24 DIAGNOSIS — R739 Hyperglycemia, unspecified: Secondary | ICD-10-CM

## 2024-01-24 DIAGNOSIS — Z794 Long term (current) use of insulin: Secondary | ICD-10-CM | POA: Diagnosis not present

## 2024-01-24 DIAGNOSIS — E1129 Type 2 diabetes mellitus with other diabetic kidney complication: Secondary | ICD-10-CM

## 2024-01-24 DIAGNOSIS — R809 Proteinuria, unspecified: Secondary | ICD-10-CM

## 2024-01-24 LAB — GLUCOSE, POCT (MANUAL RESULT ENTRY): POC Glucose: 258 mg/dL — AB (ref 70–99)

## 2024-01-24 LAB — POCT GLYCOSYLATED HEMOGLOBIN (HGB A1C): Hemoglobin A1C: 12.4 % — AB (ref 4.0–5.6)

## 2024-01-24 MED ORDER — TOUJEO SOLOSTAR 300 UNIT/ML ~~LOC~~ SOPN: 5.0000 [IU] | PEN_INJECTOR | Freq: Every day | SUBCUTANEOUS | 0 refills | Status: DC

## 2024-01-24 NOTE — Progress Notes (Signed)
 BP 110/62   Pulse 87   Temp (!) 97.4 F (36.3 C)   Resp 18   Ht 5\' 2"  (1.575 m)   Wt 127 lb 11.2 oz (57.9 kg)   SpO2 97%   BMI 23.36 kg/m    Subjective:    Patient ID: Brianna Leon, female    DOB: 12/26/1955, 68 y.o.   MRN: [id number]  HPI: Brianna Leon is a 68 y.o. female  Chief Complaint  Patient presents with   Hyperglycemia    Went to have EGD yesterday denied due to BG being elevated sent to ER given insulin and told to f/u w/ PCP.  Pt stopped rybelsus on her own and did not inform PCP making her sick.  Is reschedule for EGD Monday but also told to stop certain meds 7 days prior.  BG still elevated need to get it down before appt on Monday?    Discussed the use of AI scribe software for clinical note transcription with the patient, who gave verbal consent to proceed.  History of Present Illness Brianna Leon is a 68 year old female with type 2 diabetes who presents with elevated blood sugar levels and symptoms of hyperglycemia. She is accompanied by her daughter.  She presents with elevated blood sugar levels, with a current reading of 258 mg/dL in the office. Previously, her blood sugar reached 400 mg/dL, necessitating an emergency room visit where she received 5 units of NovoLog and a liter of fluid. During this visit, she was also diagnosed with acute cystitis and started on Keflex, which she has been tolerating well.  Over the past two to three weeks, she has experienced increased thirst and urinary frequency. Her daughter notes a change in her habits, as she has been consuming more soft drinks, including non-diet options. No fever is present, and there is improvement in urinary symptoms after starting Keflex.  She has not been taking her diabetes medications consistently. She was previously on Rybelsus 7 mg daily and Jardiance 25 mg daily. However, she discontinued Jardiance for her procedure and stopped Rybelsus due to gastrointestinal upset. Her daughter  mentions that she has been inconsistent with her medication for at least two to three weeks, if not longer.  Her A1c was 7.2% in December 2024, but it has increased to 12.4% as of today. She has not been monitoring her blood sugar consistently since January due to issues with her insurance and glucose monitor. Her daughter is actively involved in her care and plans to accompany her to future appointments.  She was scheduled for an EGD but had to visit the emergency room due to the elevated blood sugar level.       12/19/2023    4:45 PM 10/19/2023    3:51 PM 07/20/2023    3:23 PM  Depression screen PHQ 2/9  Decreased Interest 1 1 0  Down, Depressed, Hopeless 1 2 0  PHQ - 2 Score 2 3 0  Altered sleeping 3 2 0  Tired, decreased energy 1 2 0  Change in appetite 1 1 0  Feeling bad or failure about yourself  1 1 0  Trouble concentrating 0 1   Moving slowly or fidgety/restless 0 1 0  Suicidal thoughts 0 0 0  PHQ-9 Score 8 11 0  Difficult doing work/chores Somewhat difficult Somewhat difficult Not difficult at all    Relevant past medical, surgical, family and social history reviewed and updated as indicated. Interim medical history since our last  visit reviewed. Allergies and medications reviewed and updated.  Review of Systems  Ten systems reviewed and is negative except as mentioned in HPI      Objective:    BP 110/62   Pulse 87   Temp (!) 97.4 F (36.3 C)   Resp 18   Ht 5\' 2"  (1.575 m)   Wt 127 lb 11.2 oz (57.9 kg)   SpO2 97%   BMI 23.36 kg/m    Wt Readings from Last 3 Encounters:  01/24/24 127 lb 11.2 oz (57.9 kg)  01/23/24 124 lb (56.2 kg)  01/23/24 124 lb (56.2 kg)    Physical Exam Vitals reviewed.  Constitutional:      Appearance: Normal appearance.  HENT:     Head: Normocephalic.  Cardiovascular:     Rate and Rhythm: Normal rate and regular rhythm.  Pulmonary:     Effort: Pulmonary effort is normal.     Breath sounds: Normal breath sounds.   Musculoskeletal:        General: Normal range of motion.  Skin:    General: Skin is warm and dry.  Neurological:     General: No focal deficit present.     Mental Status: She is alert and oriented to person, place, and time. Mental status is at baseline.  Psychiatric:        Mood and Affect: Mood normal.        Behavior: Behavior normal.        Thought Content: Thought content normal.        Judgment: Judgment normal.     Results for orders placed or performed in visit on 01/24/24  POCT Glucose (CBG)   Collection Time: 01/24/24 10:12 AM  Result Value Ref Range   POC Glucose 258 (A) 70 - 99 mg/dl  POCT HgB B1Y   Collection Time: 01/24/24 10:44 AM  Result Value Ref Range   Hemoglobin A1C 12.4 (A) 4.0 - 5.6 %   HbA1c POC (<> result, manual entry)     HbA1c, POC (prediabetic range)     HbA1c, POC (controlled diabetic range)         Assessment & Plan:   Problem List Items Addressed This Visit       Endocrine   DM (diabetes mellitus), type 2 (HCC)   Relevant Medications   insulin glargine, 1 Unit Dial, (TOUJEO SOLOSTAR) 300 UNIT/ML Solostar Pen   Other Relevant Orders   POCT HgB A1C (Completed)   Other Visit Diagnoses       Hyperglycemia    -  Primary   Relevant Medications   insulin glargine, 1 Unit Dial, (TOUJEO SOLOSTAR) 300 UNIT/ML Solostar Pen   Other Relevant Orders   POCT Glucose (CBG) (Completed)   POCT HgB A1C (Completed)       Assessment and Plan Assessment & Plan Type 2 Diabetes Mellitus She presents with hyperglycemia, with a blood sugar level of 400 mg/dL leading to an ER visit. She has not been taking her prescribed diabetes medications, Jardiance and Rybelsus, due to discontinuation for a procedure and adverse effects, respectively. Her blood sugar was 258 mg/dL in the office today, and her A1c has increased to 12.6 from 7.2 in December 2024. She reports increased thirst and urinary frequency, indicating poor glycemic control. The decision to  prescribe Toujeo insulin was made to manage her blood sugar levels and prevent further complications, especially with an upcoming procedure. - Prescribe Toujeo insulin, 5 units daily before breakfast. - Educate on the importance of  medication adherence and reducing sugary drink intake. - Discuss alternative insulin options if insurance does not cover Toujeo. - Advise to follow up with primary care provider on the first Friday in April.  Acute Cystitis She was diagnosed with acute cystitis in the ER and started on Keflex. She reports improvement in urinary symptoms and is tolerating the medication well. - Continue Keflex as prescribed.      Follow up plan: Return for follow up with pcp.

## 2024-01-24 NOTE — Telephone Encounter (Signed)
  Chief Complaint: high blood sugar  Symptoms: high blood sugar readings, frequent urination, increase thirst Frequency: since yesterday, but patient unsure how long exactly Pertinent Negatives: Patient denies sob, vomiting Disposition: [] ED /[] Urgent Care (no appt availability in office) / [x] Appointment(In office/virtual)/ []  [facility]/ [] Home Care/ [] Refused Recommended Disposition /[] [facility]/ []  Follow-up with PCP Additional Notes: Patient reports she has been unable to check blood sugar since January because her machine is broken. Patient reports she was scheduled for a surgical procedure yesterday and was unable to get it completed due to a high blood sugar reading in the 400's when checked. Patient states due to her machine being broken she is unsure how long it has been high but she has been experiencing frequent urination and increased thirst "for a while". Patient reports when she checked her sugar this morning it was 389, but with the machine being broken she is unsure if this is accurate. Patient reports she did go to the ED yesterday after her procedure was cancelled and they were able to lower her sugar and sent her home. Per protocol, this RN attempted to schedule in office appt today, no availability at [facility] until tomorrow. So this RN was able to schedule with [facility] office today. Patient advised to call back with worsening symptoms. Patient verbalized understanding.       Copied from CRM [id number]. Topic: Clinical - Red Word Triage >> Jan 24, 2024  8:20 AM Ivette P wrote: Red Word that prompted transfer to Nurse Triage:  High Blood Pressure - 389  Went to ED and was at 462 - was going to do procedure. Did not do procedure. Reason for Disposition  [1] Symptoms of high blood sugar (e.g., abnormally thirsty, frequent urination, weight loss) AND [2] not able to test blood glucose  Answer Assessment - Initial Assessment Questions 1. BLOOD GLUCOSE:  "What is your blood glucose level?"      389 2. ONSET: "When did you check the blood glucose?"     yesterday 3. USUAL RANGE: "What is your glucose level usually?" (e.g., usual fasting morning value, usual evening value)     Patient unsure as she has not been able to check her sugar since January due to a broken machine 4. KETONES: "Do you check for ketones (urine or blood test strips)?" If Yes, ask: "What does the test show now?"      no 5. TYPE 1 or 2:  "Do you know what type of diabetes you have?"  (e.g., Type 1, Type 2, Gestational; doesn't know)      Type 2 6. INSULIN: "Do you take insulin?" "What type of insulin(s) do you use? What is the mode of delivery? (syringe, pen; injection or pump)?"      No insulin 7. DIABETES PILLS: "Do you take any pills for your diabetes?" If Yes, ask: "Have you missed taking any pills recently?"     Jardiance, reybelsus 8. OTHER SYMPTOMS: "Do you have any symptoms?" (e.g., fever, frequent urination, difficulty breathing, dizziness, weakness, vomiting)     Increased thirst, frequent urination  Protocols used: Diabetes - High Blood Sugar-A-AH

## 2024-01-25 DIAGNOSIS — H93A3 Pulsatile tinnitus, bilateral: Secondary | ICD-10-CM | POA: Diagnosis not present

## 2024-01-25 DIAGNOSIS — E785 Hyperlipidemia, unspecified: Secondary | ICD-10-CM | POA: Diagnosis not present

## 2024-01-25 DIAGNOSIS — E1169 Type 2 diabetes mellitus with other specified complication: Secondary | ICD-10-CM | POA: Diagnosis not present

## 2024-01-25 DIAGNOSIS — G43909 Migraine, unspecified, not intractable, without status migrainosus: Secondary | ICD-10-CM | POA: Diagnosis not present

## 2024-01-25 DIAGNOSIS — E1165 Type 2 diabetes mellitus with hyperglycemia: Secondary | ICD-10-CM | POA: Diagnosis not present

## 2024-01-25 DIAGNOSIS — J309 Allergic rhinitis, unspecified: Secondary | ICD-10-CM | POA: Diagnosis not present

## 2024-01-25 DIAGNOSIS — F330 Major depressive disorder, recurrent, mild: Secondary | ICD-10-CM | POA: Diagnosis not present

## 2024-01-25 DIAGNOSIS — K219 Gastro-esophageal reflux disease without esophagitis: Secondary | ICD-10-CM | POA: Diagnosis not present

## 2024-01-25 DIAGNOSIS — H9193 Unspecified hearing loss, bilateral: Secondary | ICD-10-CM | POA: Diagnosis not present

## 2024-01-25 DIAGNOSIS — E538 Deficiency of other specified B group vitamins: Secondary | ICD-10-CM | POA: Diagnosis not present

## 2024-01-25 DIAGNOSIS — G8929 Other chronic pain: Secondary | ICD-10-CM | POA: Diagnosis not present

## 2024-01-25 DIAGNOSIS — E559 Vitamin D deficiency, unspecified: Secondary | ICD-10-CM | POA: Diagnosis not present

## 2024-01-25 LAB — URINE CULTURE: Culture: >=100000 — AB

## 2024-01-28 ENCOUNTER — Ambulatory Visit

## 2024-01-28 ENCOUNTER — Ambulatory Visit
Admission: RE | Admit: 2024-01-28 | Discharge: 2024-01-28 | Disposition: A | Discharge status: Home / Self Care | Attending: Gastroenterology | Admitting: Gastroenterology

## 2024-01-28 ENCOUNTER — Encounter: Payer: Self-pay | Admitting: Gastroenterology

## 2024-01-28 ENCOUNTER — Encounter
Admission: RE | Disposition: A | Discharge status: Home / Self Care | Payer: Self-pay | Source: Home / Self Care | Attending: Gastroenterology

## 2024-01-28 DIAGNOSIS — I2510 Atherosclerotic heart disease of native coronary artery without angina pectoris: Secondary | ICD-10-CM | POA: Insufficient documentation

## 2024-01-28 DIAGNOSIS — K224 Dyskinesia of esophagus: Secondary | ICD-10-CM | POA: Insufficient documentation

## 2024-01-28 DIAGNOSIS — F419 Anxiety disorder, unspecified: Secondary | ICD-10-CM | POA: Diagnosis not present

## 2024-01-28 DIAGNOSIS — Z794 Long term (current) use of insulin: Secondary | ICD-10-CM | POA: Insufficient documentation

## 2024-01-28 DIAGNOSIS — E119 Type 2 diabetes mellitus without complications: Secondary | ICD-10-CM | POA: Diagnosis not present

## 2024-01-28 DIAGNOSIS — Z833 Family history of diabetes mellitus: Secondary | ICD-10-CM | POA: Insufficient documentation

## 2024-01-28 DIAGNOSIS — R1310 Dysphagia, unspecified: Secondary | ICD-10-CM

## 2024-01-28 DIAGNOSIS — Z79899 Other long term (current) drug therapy: Secondary | ICD-10-CM | POA: Diagnosis not present

## 2024-01-28 DIAGNOSIS — Z7951 Long term (current) use of inhaled steroids: Secondary | ICD-10-CM | POA: Diagnosis not present

## 2024-01-28 DIAGNOSIS — Z5309 Procedure and treatment not carried out because of other contraindication: Secondary | ICD-10-CM | POA: Insufficient documentation

## 2024-01-28 DIAGNOSIS — E785 Hyperlipidemia, unspecified: Secondary | ICD-10-CM | POA: Insufficient documentation

## 2024-01-28 DIAGNOSIS — K219 Gastro-esophageal reflux disease without esophagitis: Secondary | ICD-10-CM | POA: Insufficient documentation

## 2024-01-28 DIAGNOSIS — Z538 Procedure and treatment not carried out for other reasons: Secondary | ICD-10-CM | POA: Diagnosis not present

## 2024-01-28 DIAGNOSIS — T182XXA Foreign body in stomach, initial encounter: Secondary | ICD-10-CM | POA: Diagnosis not present

## 2024-01-28 DIAGNOSIS — M1990 Unspecified osteoarthritis, unspecified site: Secondary | ICD-10-CM | POA: Insufficient documentation

## 2024-01-28 HISTORY — PX: ESOPHAGOGASTRODUODENOSCOPY: SHX5428

## 2024-01-28 LAB — GLUCOSE, CAPILLARY: Glucose-Capillary: 172 mg/dL — ABNORMAL HIGH (ref 70–99)

## 2024-01-28 SURGERY — EGD (ESOPHAGOGASTRODUODENOSCOPY)
Anesthesia: General

## 2024-01-28 MED ORDER — PROPOFOL 500 MG/50ML IV EMUL
INTRAVENOUS | Status: DC | PRN
  Administered 2024-01-28: 100 mg via INTRAVENOUS

## 2024-01-28 MED ORDER — SODIUM CHLORIDE 0.9 % IV SOLN
INTRAVENOUS | Status: DC
  Administered 2024-01-28: 500 mL via INTRAVENOUS

## 2024-01-28 MED ORDER — LIDOCAINE HCL (PF) 2 % IJ SOLN
INTRAMUSCULAR | Status: DC | PRN
  Administered 2024-01-28: 100 mg via INTRADERMAL

## 2024-01-28 NOTE — Anesthesia Preprocedure Evaluation (Addendum)
 Anesthesia Evaluation  Patient identified by MRN, date of birth, ID band Patient awake    Reviewed: Allergy & Precautions, H&P , NPO status , Patient's Chart, lab work & pertinent test results  Airway Mallampati: II  TM Distance: >3 FB Neck ROM: full    Dental no notable dental hx.    Pulmonary neg pulmonary ROS   Pulmonary exam normal        Cardiovascular (-) angina + CAD  Normal cardiovascular exam     Neuro/Psych  PSYCHIATRIC DISORDERS Anxiety     negative neurological ROS     GI/Hepatic Neg liver ROS,GERD  ,,  Endo/Other  diabetes, Insulin Dependent    Renal/GU negative Renal ROS  negative genitourinary   Musculoskeletal  (+) Arthritis ,    Abdominal Normal abdominal exam  (+)   Peds  Hematology negative hematology ROS (+)   Anesthesia Other Findings Past Medical History: No date: Arthritis     Comment:  KNEES 08/02/2017: Biliary colic No date: CAD (coronary artery disease) 2000: DM (diabetes mellitus), type 2 (HCC)     Comment:  insulin started 2016 No date: Elevated liver enzymes No date: Family history of colonic polyps     Comment:  father No date: GERD (gastroesophageal reflux disease) No date: Hemorrhage     Comment:  DURING PREGNANCY 31 YEARS AGO No date: Hyperlipidemia No date: IBS (irritable bowel syndrome) No date: Migraine 06/12/2017: RUQ pain  Past Surgical History: No date: CARDIAC CATHETERIZATION     Comment:  x 2 No date: CARPAL TUNNEL RELEASE; Right 09/20/2017: CHOLECYSTECTOMY; N/A     Comment:  Procedure: LAPAROSCOPIC CHOLECYSTECTOMY WITH               INTRAOPERATIVE CHOLANGIOGRAM;  Surgeon: Earline Mayotte, MD;  Location: [facility] ORS;  Service: General;                Laterality: N/A; 05/18/2015: COLONOSCOPY     Comment:  repeat 5 years 07/01/2020: COLONOSCOPY WITH PROPOFOL; N/A     Comment:  Procedure: COLONOSCOPY WITH PROPOFOL;  Surgeon: [name], MD;  Location: [facility] ENDOSCOPY;  Service:               Gastroenterology;  Laterality: N/A; 05/18/2015: ESOPHAGOGASTRODUODENOSCOPY 01/23/2024: ESOPHAGOGASTRODUODENOSCOPY (EGD) WITH PROPOFOL; N/A     Comment:  Procedure: ESOPHAGOGASTRODUODENOSCOPY (EGD) WITH               PROPOFOL;  Surgeon: [name], MD;  Location: [facility]               ENDOSCOPY;  Service: Gastroenterology;  Laterality: N/A; 2001 and 2009: KNEE ARTHROSCOPY No date: TONSILLECTOMY 22 years ago: TOTAL ABDOMINAL HYSTERECTOMY     Reproductive/Obstetrics negative OB ROS                             Anesthesia Physical Anesthesia Plan  ASA: 3  Anesthesia Plan: General   Post-op Pain Management:    Induction: Intravenous  PONV Risk Score and Plan: Propofol infusion and TIVA  Airway Management Planned: Natural Airway  Additional Equipment:   Intra-op Plan:   Post-operative Plan:   Informed Consent: I have reviewed the patients History and Physical, chart, labs and discussed the procedure including the risks, benefits and alternatives for the proposed anesthesia with  the patient or authorized representative who has indicated his/her understanding and acceptance.     Dental Advisory Given  Plan Discussed with: CRNA and Surgeon  Anesthesia Plan Comments:         Anesthesia Quick Evaluation

## 2024-01-28 NOTE — Transfer of Care (Signed)
 Immediate Anesthesia Transfer of Care Note  Patient: Brianna Leon [facility]  Procedure(s) Performed: EGD (ESOPHAGOGASTRODUODENOSCOPY)  Patient Location: PACU  Anesthesia Type:General  Level of Consciousness: drowsy  Airway & Oxygen Therapy: Patient Spontanous Breathing  Post-op Assessment: Report given to RN and Post -op Vital signs reviewed and stable  Post vital signs: Reviewed and stable  Last Vitals:  Vitals Value Taken Time  BP 104/59 01/28/24 1101  Temp    Pulse 81 01/28/24 1102  Resp 15 01/28/24 1102  SpO2 96 % 01/28/24 1102  Vitals shown include unfiled device data.  Last Pain:  Vitals:   01/28/24 1015  TempSrc: Temporal  PainSc: 0-No pain         Complications: There were no known notable events for this encounter.

## 2024-01-28 NOTE — H&P (Signed)
 Brianna Mood, MD [address], [address], [name], [place], [address] [address], [place], [place], [address] Phone: [phone]  Fax: [phone]  Primary Care Physician:  Dorcas Carrow, DO   Pre-Procedure History & Physical: HPI:  Brianna Leon is a 68 y.o. female is here for an endoscopy    Past Medical History:  Diagnosis Date   Arthritis    KNEES   Biliary colic 08/02/2017   CAD (coronary artery disease)    DM (diabetes mellitus), type 2 (HCC) 2000   insulin started 2016   Elevated liver enzymes    Family history of colonic polyps    father   GERD (gastroesophageal reflux disease)    Hemorrhage    DURING PREGNANCY 31 YEARS AGO   Hyperlipidemia    IBS (irritable bowel syndrome)    Migraine    RUQ pain 06/12/2017    Past Surgical History:  Procedure Laterality Date   CARDIAC CATHETERIZATION     x 2   CARPAL TUNNEL RELEASE Right    CHOLECYSTECTOMY N/A 09/20/2017   Procedure: LAPAROSCOPIC CHOLECYSTECTOMY WITH INTRAOPERATIVE CHOLANGIOGRAM;  Surgeon: Earline Mayotte, MD;  Location: [facility] ORS;  Service: General;  Laterality: N/A;   COLONOSCOPY  05/18/2015   repeat 5 years   COLONOSCOPY WITH PROPOFOL N/A 07/01/2020   Procedure: COLONOSCOPY WITH PROPOFOL;  Surgeon: Brianna Mood, MD;  Location: [facility] ENDOSCOPY;  Service: Gastroenterology;  Laterality: N/A;   ESOPHAGOGASTRODUODENOSCOPY  05/18/2015   ESOPHAGOGASTRODUODENOSCOPY (EGD) WITH PROPOFOL N/A 01/23/2024   Procedure: ESOPHAGOGASTRODUODENOSCOPY (EGD) WITH PROPOFOL;  Surgeon: Brianna Mood, MD;  Location: [facility] ENDOSCOPY;  Service: Gastroenterology;  Laterality: N/A;   KNEE ARTHROSCOPY  2001 and 2009   TONSILLECTOMY     TOTAL ABDOMINAL HYSTERECTOMY  22 years ago    Prior to Admission medications   Medication Sig Start Date End Date Taking? Authorizing Provider  acetaminophen (TYLENOL) 500 MG tablet Take 2 tablets (1,000 mg total) by mouth every 6 (six) hours. 04/16/23   Adam Phenix, PA-C   albuterol (VENTOLIN HFA) 108 (90 Base) MCG/ACT inhaler Inhale 2 puffs into the lungs every 6 (six) hours as needed for wheezing or shortness of breath. 12/19/23   Olevia Perches P, DO  Ascorbic Acid (CHEWABLE VITAMIN C PO) Take by mouth daily.    [provider]  aspirin EC 81 MG tablet Take 81 mg by mouth daily.    [provider]  blood glucose meter kit and supplies KIT Dispense based on patient and insurance preference. Use up to four times daily as directed. (FOR ICD-9 250.00, 250.01). 10/24/16   Minna Antis, MD  Blood Glucose Monitoring Suppl DEVI 1 each by Does not apply route in the morning, at noon, and at bedtime. May substitute to any manufacturer covered by patient's insurance. 01/23/24   [name], MD  budesonide-formoterol (SYMBICORT) 160-4.5 MCG/ACT inhaler Inhale 2 puffs into the lungs 2 (two) times daily. 12/19/23   Johnson, Megan P, DO  cephALEXin (KEFLEX) 500 MG capsule Take 1 capsule (500 mg total) by mouth 2 (two) times daily for 7 days. 01/23/24 01/30/24  [name], MD  chlorhexidine (PERIDEX) 0.12 % solution by Mouth Rinse route in the morning and at bedtime. 05/08/22   [provider]  docusate sodium (COLACE) 100 MG capsule Take 1 capsule (100 mg total) by mouth 2 (two) times daily. 04/16/23   Adam Phenix, PA-C  empagliflozin (JARDIANCE) 25 MG TABS tablet TAKE 1 TABLET(25 MG) BY MOUTH DAILY  10/19/23   Johnson, Megan P, DO  EPINEPHrine 0.3 mg/0.3 mL IJ SOAJ injection INJECT INTRAMUSCULARLY AS DIRECTED 02/01/22   Johnson, Megan P, DO  escitalopram (LEXAPRO) 20 MG tablet TAKE 1 TABLET(20 MG) BY MOUTH DAILY 10/19/23   Johnson, Megan P, DO  fexofenadine (ALLEGRA) 180 MG tablet Take 180 mg by mouth daily.    [provider]  Glucose Blood (BLOOD GLUCOSE TEST STRIPS) STRP 1 each by In Vitro route in the morning, at noon, and at bedtime. May substitute to any manufacturer covered by patient's insurance. 01/23/24   [name], MD  ibuprofen  (ADVIL) 600 MG tablet Take 600 mg by mouth every 6 (six) hours as needed. 05/22/22   [provider]  insulin glargine, 1 Unit Dial, (TOUJEO SOLOSTAR) 300 UNIT/ML Solostar Pen Inject 5 Units into the skin daily. Total daily dose = 0.2-0.5 units/kg/day---> 50% Basal/50% Bolus 01/24/24   Berniece Salines, FNP  Lancet Device MISC 1 each by Does not apply route in the morning, at noon, and at bedtime. May substitute to any manufacturer covered by patient's insurance. 01/23/24 02/22/24  [name], MD  Lancets Misc. MISC 1 each by Does not apply route in the morning, at noon, and at bedtime. May substitute to any manufacturer covered by patient's insurance. 01/23/24 02/22/24  [name], MD  lidocaine (LIDODERM) 5 % Place 1-2 patches onto the skin daily. Remove & Discard patch within 12 hours or as directed by MD 04/16/23   Adam Phenix, PA-C  LORazepam (ATIVAN) 0.5 MG tablet Take 1 tablet (0.5 mg total) by mouth 2 (two) times daily as needed for anxiety. 02/02/23   Johnson, Megan P, DO  methocarbamol (ROBAXIN) 500 MG tablet Take 1.5 tablets (750 mg total) by mouth every 6 (six) hours as needed (use for muscle cramps/pain). 10/19/23   Johnson, Megan P, DO  Multiple Vitamins-Minerals (OCUVITE ADULT 50+ PO) Take 1 capsule by mouth daily.    [provider]  Multiple Vitamins-Minerals (ZINC PO) Take by mouth daily.    [provider]  omeprazole (PRILOSEC) 40 MG capsule Take 1 capsule (40 mg total) by mouth daily. 10/19/23   Johnson, Megan P, DO  omeprazole (PRILOSEC) 40 MG capsule Take 1 capsule (40 mg total) by mouth daily. 12/25/23   Celso Amy, PA-C  ondansetron (ZOFRAN-ODT) 4 MG disintegrating tablet Take 1 tablet (4 mg total) by mouth every 8 (eight) hours as needed for nausea or vomiting. 03/29/23   Sharman Cheek, MD  Semaglutide (RYBELSUS) 7 MG TABS TAKE 1 TABLET(7 MG) BY MOUTH DAILY 07/30/23   Johnson, Megan P, DO  simvastatin (ZOCOR) 40 MG tablet TAKE 1 TABLET(40 MG) BY  MOUTH DAILY AT 6 PM 10/19/23   Johnson, Megan P, DO  UNABLE TO FIND Take by mouth daily. Vitamin D3, vitamin B, Bioflex    [provider]  UNABLE TO FIND Take by mouth daily. Ocutive eye vitamins, cinnamon vitamins    [provider]    Allergies as of 01/23/2024 - Review Complete 01/23/2024  Allergen Reaction Noted   Carafate [sucralfate] Anaphylaxis and Hives 06/26/2017   Atorvastatin Other (See Comments) 04/01/2014   Metformin and related Diarrhea 04/06/2015   Other Hives and Other (See Comments) 06/11/2017   Penicillins Nausea And Vomiting 04/06/2015   Tramadol Nausea Only 04/01/2014    Family History  Problem Relation Age of Onset   Colon polyps Father    Diabetes Father    Hypertension Father    Heart disease Father  Stroke Father    Diabetes Mother    Hypertension Mother    Diabetes Brother    Heart failure Brother        pacemaker   Hypertension Brother    Heart attack Brother    Migraines Daughter    Lupus Son    Diabetes Maternal Grandmother    Heart disease Maternal Grandmother    Stroke Maternal Grandfather    Hypertension Paternal Grandmother    Stroke Paternal Grandfather    Hyperlipidemia Brother    Breast cancer Neg Hx     Social History   Socioeconomic History   Marital status: Married    Spouse name: Not on file   Number of children: Not on file   Years of education: Not on file   Highest education level: Not on file  Occupational History   Not on file  Tobacco Use   Smoking status: Never   Smokeless tobacco: Never  Vaping Use   Vaping status: Never Used  Substance and Sexual Activity   Alcohol use: No   Drug use: No   Sexual activity: Yes  Other Topics Concern   Not on file  Social History Narrative   Not on file   Social Drivers of Health   Financial Resource Strain: Not on file  Food Insecurity: No Food Insecurity (04/17/2023)   Hunger Vital Sign    Worried About Running Out of Food in the Last Year: Never  true    Ran Out of Food in the Last Year: Never true  Transportation Needs: No Transportation Needs (04/17/2023)   PRAPARE - Administrator, Civil Service (Medical): No    Lack of Transportation (Non-Medical): No  Physical Activity: Not on file  Stress: Not on file  Social Connections: Not on file  Intimate Partner Violence: Not on file    Review of Systems: See HPI, otherwise negative ROS  Physical Exam: There were no vitals taken for this visit. General:   Alert,  pleasant and cooperative in NAD Head:  Normocephalic and atraumatic. Neck:  Supple; no masses or thyromegaly. Lungs:  Clear throughout to auscultation, normal respiratory effort.    Heart:  +S1, +S2, Regular rate and rhythm, No edema. Abdomen:  Soft, nontender and nondistended. Normal bowel sounds, without guarding, and without rebound.   Neurologic:  Alert and  oriented x4;  grossly normal neurologically.  Impression/Plan: Geanie Logan is here for an endoscopy  to be performed for  evaluation of dysphagia    Risks, benefits, limitations, and alternatives regarding endoscopy have been reviewed with the patient.  Questions have been answered.  All parties agreeable.   Brianna Mood, MD  01/28/2024, 9:44 AM

## 2024-01-28 NOTE — Brief Op Note (Signed)
 EGD aborted due to copious amount of solid food in stomach.

## 2024-01-28 NOTE — Op Note (Signed)
 [facility] Gastroenterology Patient Name: Brianna Leon Procedure Date: 01/28/2024 10:30 AM MRN: [id number] Account #: [id number] Date of Birth: 04/20/1956 Admit Type: Outpatient Age: 68 Room: [facility] ENDO ROOM 1 Gender: Female Note Status: Finalized Instrument Name: Patton Salles Endoscope [id number] Procedure:             Upper GI endoscopy Indications:           Dysphagia Providers:             [name] MD, MD Medicines:             Monitored Anesthesia Care Complications:         No immediate complications. Procedure:             Pre-Anesthesia Assessment:                        - Prior to the procedure, a History and Physical was                         performed, and patient medications, allergies and                         sensitivities were reviewed. The patient's tolerance                         of previous anesthesia was reviewed.                        - The risks and benefits of the procedure and the                         sedation options and risks were discussed with the                         patient. All questions were answered and informed                         consent was obtained.                        - ASA Grade Assessment: II - A patient with mild                         systemic disease.                        After obtaining informed consent, the endoscope was                         passed under direct vision. Throughout the procedure,                         the patient's blood pressure, pulse, and oxygen                         saturations were monitored continuously. The Endoscope                         was introduced through the mouth, with the intention  of advancing to the duodenum. The scope was advanced                         to the gastric body before the procedure was aborted.                         Medications were given. The upper GI endoscopy was                         accomplished with ease. The  patient tolerated the                         procedure well. Findings:      No appreciable esophageal motility was noted. In addition, a patulous       lower esophageal sphincter was found. There was no resistance to       endoscope advancement into the stomach. The Z-line was regular.      A large amount of food (residue) was found in the gastric body. Impression:            - Esophageal motility disorder.                        - A large amount of food (residue) in the stomach.                        - No specimens collected. Recommendation:        - Discharge patient to home (with escort).                        - Resume previous diet.                        - Continue present medications.                        - Repeat EGD holding GLP 1 meds for a week and on                         clears for 24 hours prior to procedure Procedure Code(s):     --- Professional ---                        43235, 52, Esophagogastroduodenoscopy, flexible,                         transoral; diagnostic, including collection of                         specimen(s) by brushing or washing, when performed                         (separate procedure) Diagnosis Code(s):     --- Professional ---                        K22.4, Dyskinesia of esophagus                        R13.10, Dysphagia, unspecified CPT copyright 2022 American Medical Association. All rights reserved.  The codes documented in this report are preliminary and upon coder review may  be revised to meet current compliance requirements. [name], MD [name] MD, MD 01/28/2024 10:54:00 AM This report has been signed electronically. Number of Addenda: 0 Note Initiated On: 01/28/2024 10:30 AM Estimated Blood Loss:  Estimated blood loss: none.      [facility]

## 2024-01-29 NOTE — Anesthesia Postprocedure Evaluation (Signed)
 Anesthesia Post Note  Patient: Brianna Leon  Procedure(s) Performed: EGD (ESOPHAGOGASTRODUODENOSCOPY)  Patient location during evaluation: Endoscopy Anesthesia Type: General Level of consciousness: awake and alert Pain management: pain level controlled Vital Signs Assessment: post-procedure vital signs reviewed and stable Respiratory status: spontaneous breathing, nonlabored ventilation and respiratory function stable Cardiovascular status: blood pressure returned to baseline and stable Postop Assessment: no apparent nausea or vomiting Anesthetic complications: no   There were no known notable events for this encounter.   Last Vitals:  Vitals:   01/28/24 1110 01/28/24 1120  BP: 127/60 132/74  Pulse: 79 81  Resp: 14 15  Temp:    SpO2: 96% 99%    Last Pain:  Vitals:   01/29/24 0748  TempSrc:   PainSc: 0-No pain                 Foye Deer

## 2024-02-07 ENCOUNTER — Telehealth: Payer: Self-pay

## 2024-02-07 NOTE — Telephone Encounter (Signed)
 Left message for patient. Please ask that if she can come into her appt tomorrow afternoon 20 mins or so early it would allow [place] to stay on time with her appointment but complete the Medicare Wellness Visit ahead of her seeing Dr. Laural Benes.

## 2024-02-08 ENCOUNTER — Ambulatory Visit (INDEPENDENT_AMBULATORY_CARE_PROVIDER_SITE_OTHER): Payer: PPO | Admitting: Family Medicine

## 2024-02-08 ENCOUNTER — Encounter: Payer: Self-pay | Admitting: Family Medicine

## 2024-02-08 VITALS — BP 115/64 | HR 67 | Temp 97.5°F | Ht 62.0 in | Wt 123.8 lb

## 2024-02-08 DIAGNOSIS — Z87440 Personal history of urinary (tract) infections: Secondary | ICD-10-CM | POA: Diagnosis not present

## 2024-02-08 DIAGNOSIS — E1129 Type 2 diabetes mellitus with other diabetic kidney complication: Secondary | ICD-10-CM

## 2024-02-08 DIAGNOSIS — R809 Proteinuria, unspecified: Secondary | ICD-10-CM

## 2024-02-08 DIAGNOSIS — Z794 Long term (current) use of insulin: Secondary | ICD-10-CM

## 2024-02-08 DIAGNOSIS — F419 Anxiety disorder, unspecified: Secondary | ICD-10-CM | POA: Diagnosis not present

## 2024-02-08 DIAGNOSIS — Z0000 Encounter for general adult medical examination without abnormal findings: Secondary | ICD-10-CM | POA: Diagnosis not present

## 2024-02-08 DIAGNOSIS — Z4689 Encounter for fitting and adjustment of other specified devices: Secondary | ICD-10-CM | POA: Diagnosis not present

## 2024-02-08 DIAGNOSIS — E782 Mixed hyperlipidemia: Secondary | ICD-10-CM

## 2024-02-08 DIAGNOSIS — Z1231 Encounter for screening mammogram for malignant neoplasm of breast: Secondary | ICD-10-CM

## 2024-02-08 DIAGNOSIS — K5901 Slow transit constipation: Secondary | ICD-10-CM | POA: Diagnosis not present

## 2024-02-08 DIAGNOSIS — N8111 Cystocele, midline: Secondary | ICD-10-CM | POA: Diagnosis not present

## 2024-02-08 LAB — MICROALBUMIN, URINE WAIVED
Creatinine, Urine Waived: 100 mg/dL (ref 10–300)
Microalb, Ur Waived: 10 mg/L (ref 0–19)
Microalb/Creat Ratio: <30 mg/g (ref <30)

## 2024-02-08 LAB — BAYER DCA HB A1C WAIVED: HB A1C (BAYER DCA - WAIVED): 8.3 % — ABNORMAL HIGH (ref 4.8–5.6)

## 2024-02-08 MED ORDER — ESCITALOPRAM OXALATE 20 MG PO TABS: ORAL_TABLET | ORAL | 1 refills | Status: DC

## 2024-02-08 MED ORDER — OMEPRAZOLE 40 MG PO CPDR: 40.0000 mg | DELAYED_RELEASE_CAPSULE | Freq: Every day | ORAL | 1 refills | Status: DC

## 2024-02-08 MED ORDER — TOUJEO SOLOSTAR 300 UNIT/ML ~~LOC~~ SOPN: 7.0000 [IU] | PEN_INJECTOR | Freq: Every day | SUBCUTANEOUS | 12 refills | Status: AC

## 2024-02-08 MED ORDER — SIMVASTATIN 40 MG PO TABS: ORAL_TABLET | ORAL | 1 refills | Status: DC

## 2024-02-08 MED ORDER — ASSURE ID DUO PRO PEN NEEDLES 31G X 5 MM MISC: 1.0000 | Freq: Every day | 12 refills | Status: AC

## 2024-02-08 NOTE — Assessment & Plan Note (Signed)
 Under good control on current regimen. Continue current regimen. Continue to monitor. Call with any concerns. Refills given.

## 2024-02-08 NOTE — Assessment & Plan Note (Signed)
 Tolerating her insulin well. A1c already better down to 8.3 from 12.4. Will increase her tojeou to 7mg  and check tolerance in 6 weeks. DEXCOM ordered. Call with any concerns.

## 2024-02-08 NOTE — Progress Notes (Signed)
 Subjective:   Brianna Leon is a 68 y.o. female who presents for an Initial Medicare Annual Wellness Visit.  Visit Complete: In person  Patient Medicare AWV questionnaire was completed by the patient on 02/08/24; I have confirmed that all information answered by patient is correct and no changes since this date.  Cardiac Risk Factors include: diabetes mellitus     Objective:    Today's Vitals   02/08/24 1436  BP: 115/64  Pulse: 67  Temp: (!) 97.5 F (36.4 C)  TempSrc: Oral  SpO2: 100%  Weight: 123 lb 12.8 oz (56.2 kg)  Height: 5\' 2"  (1.575 m)  PainSc: 0-No pain   Body mass index is 22.64 kg/m.     02/08/2024    2:46 PM 01/28/2024   10:21 AM 01/23/2024    8:14 AM 04/14/2023   10:58 AM 03/29/2023    8:01 PM 07/10/2022    2:39 PM 07/01/2020   10:44 AM  Advanced Directives  Does Patient Have a Medical Advance Directive? Yes Yes Yes Yes No No No  Type of Advance Directive Living will;Healthcare Power of [employer] Power of [place];Living will  Living will     Does patient want to make changes to medical advance directive? No - Patient declined        Would patient like information on creating a medical advance directive?      No - Patient declined     Current Medications (verified) Outpatient Encounter Medications as of 02/08/2024  Medication Sig   acetaminophen (TYLENOL) 500 MG tablet Take 2 tablets (1,000 mg total) by mouth every 6 (six) hours.   albuterol (VENTOLIN HFA) 108 (90 Base) MCG/ACT inhaler Inhale 2 puffs into the lungs every 6 (six) hours as needed for wheezing or shortness of breath.   aspirin EC 81 MG tablet Take 81 mg by mouth daily.   blood glucose meter kit and supplies KIT Dispense based on patient and insurance preference. Use up to four times daily as directed. (FOR ICD-9 250.00, 250.01).   Blood Glucose Monitoring Suppl DEVI 1 each by Does not apply route in the morning, at noon, and at bedtime. May substitute to any manufacturer covered by  patient's insurance.   chlorhexidine (PERIDEX) 0.12 % solution by Mouth Rinse route in the morning and at bedtime.   docusate sodium (COLACE) 100 MG capsule Take 1 capsule (100 mg total) by mouth 2 (two) times daily.   EPINEPHrine 0.3 mg/0.3 mL IJ SOAJ injection INJECT INTRAMUSCULARLY AS DIRECTED   fexofenadine (ALLEGRA) 180 MG tablet Take 180 mg by mouth daily.   Glucose Blood (BLOOD GLUCOSE TEST STRIPS) STRP 1 each by In Vitro route in the morning, at noon, and at bedtime. May substitute to any manufacturer covered by patient's insurance.   ibuprofen (ADVIL) 600 MG tablet Take 600 mg by mouth every 6 (six) hours as needed.   Insulin Pen Needle (ASSURE ID DUO PRO PEN NEEDLES) 31G X 5 MM MISC 1 each by Does not apply route daily.   LORazepam (ATIVAN) 0.5 MG tablet Take 1 tablet (0.5 mg total) by mouth 2 (two) times daily as needed for anxiety.   Multiple Vitamins-Minerals (OCUVITE ADULT 50+ PO) Take 1 capsule by mouth daily.   Multiple Vitamins-Minerals (ZINC PO) Take by mouth daily.   ondansetron (ZOFRAN-ODT) 4 MG disintegrating tablet Take 1 tablet (4 mg total) by mouth every 8 (eight) hours as needed for nausea or vomiting.   UNABLE TO FIND Take by mouth daily. Vitamin  D3, vitamin B, Bioflex   UNABLE TO FIND Take by mouth daily. Ocutive eye vitamins, cinnamon vitamins   [DISCONTINUED] empagliflozin (JARDIANCE) 25 MG TABS tablet TAKE 1 TABLET(25 MG) BY MOUTH DAILY   [DISCONTINUED] escitalopram (LEXAPRO) 20 MG tablet TAKE 1 TABLET(20 MG) BY MOUTH DAILY   [DISCONTINUED] insulin glargine, 1 Unit Dial, (TOUJEO SOLOSTAR) 300 UNIT/ML Solostar Pen Inject 5 Units into the skin daily. Total daily dose = 0.2-0.5 units/kg/day---> 50% Basal/50% Bolus   [DISCONTINUED] omeprazole (PRILOSEC) 40 MG capsule Take 1 capsule (40 mg total) by mouth daily.   [DISCONTINUED] Semaglutide (RYBELSUS) 7 MG TABS TAKE 1 TABLET(7 MG) BY MOUTH DAILY   [DISCONTINUED] simvastatin (ZOCOR) 40 MG tablet TAKE 1 TABLET(40 MG) BY  MOUTH DAILY AT 6 PM   Ascorbic Acid (CHEWABLE VITAMIN C PO) Take by mouth daily.   budesonide-formoterol (SYMBICORT) 160-4.5 MCG/ACT inhaler Inhale 2 puffs into the lungs 2 (two) times daily. (Patient not taking: Reported on 02/08/2024)   escitalopram (LEXAPRO) 20 MG tablet TAKE 1 TABLET(20 MG) BY MOUTH DAILY   insulin glargine, 1 Unit Dial, (TOUJEO SOLOSTAR) 300 UNIT/ML Solostar Pen Inject 7 Units into the skin daily.   Lancet Device MISC 1 each by Does not apply route in the morning, at noon, and at bedtime. May substitute to any manufacturer covered by patient's insurance.   lidocaine (LIDODERM) 5 % Place 1-2 patches onto the skin daily. Remove & Discard patch within 12 hours or as directed by MD (Patient not taking: Reported on 02/08/2024)   methocarbamol (ROBAXIN) 500 MG tablet Take 1.5 tablets (750 mg total) by mouth every 6 (six) hours as needed (use for muscle cramps/pain). (Patient not taking: Reported on 02/08/2024)   omeprazole (PRILOSEC) 40 MG capsule Take 1 capsule (40 mg total) by mouth daily.   simvastatin (ZOCOR) 40 MG tablet TAKE 1 TABLET(40 MG) BY MOUTH DAILY AT 6 PM   [DISCONTINUED] Lancets Misc. MISC 1 each by Does not apply route in the morning, at noon, and at bedtime. May substitute to any manufacturer covered by patient's insurance.   [DISCONTINUED] omeprazole (PRILOSEC) 40 MG capsule Take 1 capsule (40 mg total) by mouth daily.   No facility-administered encounter medications on file as of 02/08/2024.    Allergies (verified) Carafate [sucralfate], Atorvastatin, Metformin and related, Other, Penicillins, and Tramadol   History: Past Medical History:  Diagnosis Date   Arthritis    KNEES   Biliary colic 08/02/2017   CAD (coronary artery disease)    DM (diabetes mellitus), type 2 (HCC) 2000   insulin started 2016   Elevated liver enzymes    Family history of colonic polyps    father   GERD (gastroesophageal reflux disease)    Hemorrhage    DURING PREGNANCY 31 YEARS AGO    Hyperlipidemia    IBS (irritable bowel syndrome)    Migraine    RUQ pain 06/12/2017   Past Surgical History:  Procedure Laterality Date   CARDIAC CATHETERIZATION     x 2   CARPAL TUNNEL RELEASE Right    CHOLECYSTECTOMY N/A 09/20/2017   Procedure: LAPAROSCOPIC CHOLECYSTECTOMY WITH INTRAOPERATIVE CHOLANGIOGRAM;  Surgeon: Earline Mayotte, MD;  Location: [facility] ORS;  Service: General;  Laterality: N/A;   COLONOSCOPY  05/18/2015   repeat 5 years   COLONOSCOPY WITH PROPOFOL N/A 07/01/2020   Procedure: COLONOSCOPY WITH PROPOFOL;  Surgeon: [name], MD;  Location[facility] ENDOSCOPY;  Service: Gastroenterology;  Laterality: N/A;   ESOPHAGOGASTRODUODENOSCOPY  05/18/2015   ESOPHAGOGASTRODUODENOSCOPY N/A 01/28/2024   Procedure:  EGD (ESOPHAGOGASTRODUODENOSCOPY);  Surgeon: [name], MD;  Location: [facility] ENDOSCOPY;  Service: Gastroenterology;  Laterality: N/A;   ESOPHAGOGASTRODUODENOSCOPY (EGD) WITH PROPOFOL N/A 01/23/2024   Procedure: ESOPHAGOGASTRODUODENOSCOPY (EGD) WITH PROPOFOL;  Surgeon: [name], MD;  Location: [facility] ENDOSCOPY;  Service: Gastroenterology;  Laterality: N/A;   KNEE ARTHROSCOPY  2001 and 2009   TONSILLECTOMY     TOTAL ABDOMINAL HYSTERECTOMY  22 years ago   Family History  Problem Relation Age of Onset   Colon polyps Father    Diabetes Father    Hypertension Father    Heart disease Father    Stroke Father    Diabetes Mother    Hypertension Mother    Diabetes Brother    Heart failure Brother        pacemaker   Hypertension Brother    Heart attack Brother    Migraines Daughter    Lupus Son    Diabetes Maternal Grandmother    Heart disease Maternal Grandmother    Stroke Maternal Grandfather    Hypertension Paternal Grandmother    Stroke Paternal Grandfather    Hyperlipidemia Brother    Breast cancer Neg Hx    Social History   Socioeconomic History   Marital status: Married    Spouse name: Not on file   Number of children: Not on file   Years of education: Not  on file   Highest education level: Not on file  Occupational History   Not on file  Tobacco Use   Smoking status: Never   Smokeless tobacco: Never  Vaping Use   Vaping status: Never Used  Substance and Sexual Activity   Alcohol use: No   Drug use: No   Sexual activity: Yes  Other Topics Concern   Not on file  Social History Narrative   Not on file   Social Drivers of Health   Financial Resource Strain: Low Risk  (02/08/2024)   Overall Financial Resource Strain (CARDIA)    Difficulty of Paying Living Expenses: Not hard at all  Food Insecurity: No Food Insecurity (02/08/2024)   Hunger Vital Sign    Worried About Running Out of Food in the Last Year: Never true    Ran Out of Food in the Last Year: Never true  Transportation Needs: No Transportation Needs (02/08/2024)   PRAPARE - Administrator, Civil Service (Medical): No    Lack of Transportation (Non-Medical): No  Physical Activity: Sufficiently Active (02/08/2024)   Exercise Vital Sign    Days of Exercise per Week: 7 days    Minutes of Exercise per Session: 30 min  Stress: No Stress Concern Present (02/08/2024)   [employer] of Occupational Health - Occupational Stress Questionnaire    Feeling of Stress : Only a little  Social Connections: Socially Integrated (02/08/2024)   Social Connection and Isolation Panel [NHANES]    Frequency of Communication with Friends and Family: More than three times a week    Frequency of Social Gatherings with Friends and Family: More than three times a week    Attends Religious Services: More than 4 times per year    Active Member of [employer] or Organizations: Yes    Attends Engineer, structural: More than 4 times per year    Marital Status: Married    Tobacco Counseling Counseling given: Not Answered   Clinical Intake:     Pain : No/denies pain Pain Score: 0-No pain     Diabetes: Yes  Activities of Daily Living    02/08/2024    2:37 PM  04/16/2023   11:51 AM  In your present state of health, do you have any difficulty performing the following activities:  Hearing? 1   Vision? 1   Difficulty concentrating or making decisions? 0   Walking or climbing stairs? 0   Dressing or bathing? 0   Doing errands, shopping? 0 0  Preparing Food and eating ? N   Using the Toilet? N   In the past six months, have you accidently leaked urine? N   Do you have problems with loss of bowel control? N   Managing your Medications? Y   Managing your Finances? Y   Housekeeping or managing your Housekeeping? Y     Patient Care Team: Dorcas Carrow, DO as PCP - General (Family Medicine) Dorcas Carrow, DO as Referring Physician (Family Medicine) Lemar Livings Merrily Pew, MD (General Surgery)  Indicate any recent Medical Services you may have received from other than [facility] providers in the past year (date may be approximate).     Assessment:   This is a routine wellness examination for [place].  Hearing/Vision screen No results found.   Goals Addressed   None   Depression Screen    02/08/2024    2:51 PM 12/19/2023    4:45 PM 10/19/2023    3:51 PM 07/20/2023    3:23 PM 04/19/2023   11:32 AM 02/02/2023    2:39 PM 11/03/2022    2:37 PM  PHQ 2/9 Scores  PHQ - 2 Score 2 2 3  0 0 0 0  PHQ- 9 Score 6 8 11  0 0 0 6    Fall Risk    02/08/2024    2:48 PM 12/19/2023    4:45 PM 10/19/2023    3:51 PM 07/20/2023    3:23 PM 02/02/2023    2:38 PM  Fall Risk   Falls in the past year? 1 0 0 0 1  Number falls in past yr: 1 0 0 0 1  Injury with Fall? 0 0 0 0 0  Risk for fall due to :  No Fall Risks No Fall Risks No Fall Risks History of fall(s)  Follow up  Falls evaluation completed Falls evaluation completed Falls evaluation completed Falls evaluation completed    MEDICARE RISK AT HOME: Medicare Risk at Home Any stairs in or around the home?: No If so, are there any without handrails?: No Home free of loose throw rugs in walkways, pet beds,  electrical cords, etc?: No Adequate lighting in your home to reduce risk of falls?: Yes Life alert?: No Use of a cane, walker or w/c?: No Grab bars in the bathroom?: No Shower chair or bench in shower?: Yes Elevated toilet seat or a handicapped toilet?: No  TIMED UP AND GO:  Was the test performed? Yes  Length of time to ambulate 10 feet: 8 sec Gait steady and fast without use of assistive device    Cognitive Function:        02/08/2024    2:44 PM 02/02/2023    3:09 PM  6CIT Screen  What Year? 0 points 0 points  What month? 0 points 0 points  What time? 0 points 0 points  Count back from 20 0 points 0 points  Months in reverse 0 points 2 points  Repeat phrase 0 points 0 points  Total Score 0 points 2 points    Immunizations Immunization History  Administered Date(s) Administered  Fluad Trivalent(High Dose 65+) 07/20/2023   Influenza Split 10/15/2014   Influenza,inj,Quad PF,6+ Mos 09/07/2017, 08/02/2018, 08/08/2019, 07/30/2020, 08/04/2022   Influenza-Unspecified 09/07/2017, 08/02/2018, 08/08/2019, 07/30/2020   PFIZER(Purple Top)SARS-COV-2 Vaccination 08/03/2020, 08/24/2020   PNEUMOCOCCAL CONJUGATE-20 02/02/2023   PPD Test 07/06/2015, 07/26/2015, 12/17/2017, 02/04/2018   Pneumococcal Polysaccharide-23 11/06/2006   Tdap 08/06/2009, 01/11/2018   Zoster Recombinant(Shingrix) 05/04/2022, 08/21/2022    TDAP status: Up to date  Flu Vaccine status: Up to date  Pneumococcal vaccine status: Up to date  Covid-19 vaccine status: Declined, Education has been provided regarding the importance of this vaccine but patient still declined. Advised may receive this vaccine at local pharmacy or Health Dept.or vaccine clinic. Aware to provide a copy of the vaccination record if obtained from local pharmacy or Health Dept. Verbalized acceptance and understanding.  Qualifies for Shingles Vaccine? Yes   Zostavax completed Yes     Screening Tests Health Maintenance  Topic Date Due    OPHTHALMOLOGY EXAM  05/30/2024   INFLUENZA VACCINE  06/06/2024   HEMOGLOBIN A1C  08/09/2024   Diabetic kidney evaluation - eGFR measurement  01/22/2025   Diabetic kidney evaluation - Urine ACR  02/07/2025   FOOT EXAM  02/07/2025   Medicare Annual Wellness (AWV)  02/07/2025   Colonoscopy  07/01/2025   MAMMOGRAM  07/22/2025   DEXA SCAN  07/22/2026   DTaP/Tdap/Td (3 - Td or Tdap) 01/12/2028   Pneumonia Vaccine 30+ Years old  Completed   Hepatitis C Screening  Completed   Zoster Vaccines- Shingrix  Completed   HPV VACCINES  Aged Out   COVID-19 Vaccine  Discontinued    Health Maintenance  There are no preventive care reminders to display for this patient.   Colorectal cancer screening: Type of screening: Colonoscopy. Completed 07/01/20. Repeat every 5 years  Mammogram status: Completed 07/23/23. Repeat every year  Bone Density status: Completed 07/23/23. Results reflect: Bone density results: OSTEOPENIA. Repeat every 3 years.  Lung Cancer Screening: (Low Dose CT Chest recommended if Age 57-80 years, 20 pack-year currently smoking OR have quit w/in 15years.) does not qualify.   Additional Screening:  Hepatitis C Screening: does qualify; Completed 03/26/15  Vision Screening: Recommended annual ophthalmology exams for early detection of glaucoma and other disorders of the eye. Is the patient up to date with their annual eye exam?  Yes  Who is the provider or what is the name of the office in which the patient attends annual eye exams? 05/31/23 If pt is not established with a provider, would they like to be referred to a provider to establish care?  N/A .   Dental Screening: Recommended annual dental exams for proper oral hygiene  Diabetic Foot Exam: Diabetic Foot Exam: Completed 02/08/24  Community Resource Referral / Chronic Care Management: CRR required this visit?  No   CCM required this visit?  No     Plan:     I have personally reviewed and noted the following in the  patient's chart:   Medical and social history Use of alcohol, tobacco or illicit drugs  Current medications and supplements including opioid prescriptions. Patient is not currently taking opioid prescriptions. Functional ability and status Nutritional status Physical activity Advanced directives List of other physicians Hospitalizations, surgeries, and ER visits in previous 12 months Vitals Screenings to include cognitive, depression, and falls Referrals and appointments  In addition, I have reviewed and discussed with patient certain preventive protocols, quality metrics, and best practice recommendations. A written personalized care plan for preventive services as well  as general preventive health recommendations were provided to patient.     Olevia Perches, DO   02/08/2024   After Visit Summary: (In Person-Printed) AVS printed and given to the patient  Nurse Notes: N/A

## 2024-02-08 NOTE — Progress Notes (Signed)
 BP 115/64   Pulse 67   Temp (!) 97.5 F (36.4 C) (Oral)   Ht 5\' 2"  (1.575 m)   Wt 123 lb 12.8 oz (56.2 kg)   SpO2 100%   BMI 22.64 kg/m    Subjective:    Patient ID: Brianna Leon, female    DOB: 06-Feb-1956, 68 y.o.   MRN: [id number]  HPI: Brianna Leon is a 68 y.o. female presenting on 02/08/2024 for comprehensive medical examination. Current medical complaints include:  DIABETES- went to have an EGD after her last appointment and her sugars were >400- her procedure was cancelled and she went to the ER and was given novolog. She had a follow up with another primary care office and was started on toujeo 5 units daily  Hypoglycemic episodes:no Polydipsia/polyuria: no Visual disturbance: yes Chest pain: no Paresthesias: no Glucose Monitoring: yes  Accucheck frequency: Daily  Fasting glucose: 104  Post prandial: 224 Taking Insulin?: yes  Long acting insulin: 5 units of the toujeo Blood Pressure Monitoring: not checking Retinal Examination: Up to Date Foot Exam: Up to Date Diabetic Education: Completed Pneumovax: Up to Date Influenza: Up to Date Aspirin: yes  HYPERTENSION / HYPERLIPIDEMIA Satisfied with current treatment? yes Duration of hypertension: chronic BP monitoring frequency: not checking BP medication side effects: no Past BP meds: none Duration of hyperlipidemia: chronic Cholesterol medication side effects: no Cholesterol supplements: none Past cholesterol medications: simvastatin Medication compliance: excellent compliance Aspirin: yes Recent stressors: no Recurrent headaches: no Visual changes: no Palpitations: no Dyspnea: no Chest pain: no Lower extremity edema: no Dizzy/lightheaded: no  ANXIETY/DEPRESSION Duration: chronic Status:controlled Anxious mood: yes  Excessive worrying: no Irritability: no  Sweating: no Nausea: no Palpitations:no Hyperventilation: no Panic attacks: no Agoraphobia: no  Obscessions/compulsions: no Depressed  mood: no    02/08/2024    2:51 PM 12/19/2023    4:45 PM 10/19/2023    3:51 PM 07/20/2023    3:23 PM 04/19/2023   11:32 AM  Depression screen PHQ 2/9  Decreased Interest 1 1 1  0 0  Down, Depressed, Hopeless 1 1 2  0 0  PHQ - 2 Score 2 2 3  0 0  Altered sleeping 1 3 2  0 0  Tired, decreased energy 1 1 2  0 0  Change in appetite 1 1 1  0 0  Feeling bad or failure about yourself  1 1 1  0 0  Trouble concentrating 0 0 1  0  Moving slowly or fidgety/restless 0 0 1 0 0  Suicidal thoughts 0 0 0 0 0  PHQ-9 Score 6 8 11  0 0  Difficult doing work/chores Somewhat difficult Somewhat difficult Somewhat difficult Not difficult at all Not difficult at all   Anhedonia: no Weight changes: no Insomnia: no   Hypersomnia: no Fatigue/loss of energy: no Feelings of worthlessness: no Feelings of guilt: no Impaired concentration/indecisiveness: no Suicidal ideations: no  Crying spells: no Recent Stressors/Life Changes: no   Relationship problems: no   Family stress: no     Financial stress: no    Job stress: no    Recent death/loss: no  Menopausal Symptoms: no  Depression Screen done today and results listed below:     02/08/2024    2:51 PM 12/19/2023    4:45 PM 10/19/2023    3:51 PM 07/20/2023    3:23 PM 04/19/2023   11:32 AM  Depression screen PHQ 2/9  Decreased Interest 1 1 1  0 0  Down, Depressed, Hopeless 1 1 2  0 0  PHQ -  2 Score 2 2 3  0 0  Altered sleeping 1 3 2  0 0  Tired, decreased energy 1 1 2  0 0  Change in appetite 1 1 1  0 0  Feeling bad or failure about yourself  1 1 1  0 0  Trouble concentrating 0 0 1  0  Moving slowly or fidgety/restless 0 0 1 0 0  Suicidal thoughts 0 0 0 0 0  PHQ-9 Score 6 8 11  0 0  Difficult doing work/chores Somewhat difficult Somewhat difficult Somewhat difficult Not difficult at all Not difficult at all    Past Medical History:  Past Medical History:  Diagnosis Date   Arthritis    KNEES   Biliary colic 08/02/2017   CAD (coronary artery disease)    DM  (diabetes mellitus), type 2 (HCC) 2000   insulin started 2016   Elevated liver enzymes    Family history of colonic polyps    father   GERD (gastroesophageal reflux disease)    Hemorrhage    DURING PREGNANCY 31 YEARS AGO   Hyperlipidemia    IBS (irritable bowel syndrome)    Migraine    RUQ pain 06/12/2017    Surgical History:  Past Surgical History:  Procedure Laterality Date   CARDIAC CATHETERIZATION     x 2   CARPAL TUNNEL RELEASE Right    CHOLECYSTECTOMY N/A 09/20/2017   Procedure: LAPAROSCOPIC CHOLECYSTECTOMY WITH INTRAOPERATIVE CHOLANGIOGRAM;  Surgeon: Earline Mayotte, MD;  Location: [facility] ORS;  Service: General;  Laterality: N/A;   COLONOSCOPY  05/18/2015   repeat 5 years   COLONOSCOPY WITH PROPOFOL N/A 07/01/2020   Procedure: COLONOSCOPY WITH PROPOFOL;  Surgeon: [name], MD;  Location: [facility] ENDOSCOPY;  Service: Gastroenterology;  Laterality: N/A;   ESOPHAGOGASTRODUODENOSCOPY  05/18/2015   ESOPHAGOGASTRODUODENOSCOPY N/A 01/28/2024   Procedure: EGD (ESOPHAGOGASTRODUODENOSCOPY);  Surgeon: [name], MD;  Location: [facility] ENDOSCOPY;  Service: Gastroenterology;  Laterality: N/A;   ESOPHAGOGASTRODUODENOSCOPY (EGD) WITH PROPOFOL N/A 01/23/2024   Procedure: ESOPHAGOGASTRODUODENOSCOPY (EGD) WITH PROPOFOL;  Surgeon: [name], MD;  Location[facility];  Service: Gastroenterology;  Laterality: N/A;   KNEE ARTHROSCOPY  2001 and 2009   TONSILLECTOMY     TOTAL ABDOMINAL HYSTERECTOMY  22 years ago    Medications:  Current Outpatient Medications on File Prior to Visit  Medication Sig   acetaminophen (TYLENOL) 500 MG tablet Take 2 tablets (1,000 mg total) by mouth every 6 (six) hours.   albuterol (VENTOLIN HFA) 108 (90 Base) MCG/ACT inhaler Inhale 2 puffs into the lungs every 6 (six) hours as needed for wheezing or shortness of breath.   aspirin EC 81 MG tablet Take 81 mg by mouth daily.   blood glucose meter kit and supplies KIT Dispense based on patient and insurance  preference. Use up to four times daily as directed. (FOR ICD-9 250.00, 250.01).   Blood Glucose Monitoring Suppl DEVI 1 each by Does not apply route in the morning, at noon, and at bedtime. May substitute to any manufacturer covered by patient's insurance.   chlorhexidine (PERIDEX) 0.12 % solution by Mouth Rinse route in the morning and at bedtime.   docusate sodium (COLACE) 100 MG capsule Take 1 capsule (100 mg total) by mouth 2 (two) times daily.   EPINEPHrine 0.3 mg/0.3 mL IJ SOAJ injection INJECT INTRAMUSCULARLY AS DIRECTED   fexofenadine (ALLEGRA) 180 MG tablet Take 180 mg by mouth daily.   Glucose Blood (BLOOD GLUCOSE TEST STRIPS) STRP 1 each by In Vitro route in the morning, at noon, and at  bedtime. May substitute to any manufacturer covered by patient's insurance.   ibuprofen (ADVIL) 600 MG tablet Take 600 mg by mouth every 6 (six) hours as needed.   LORazepam (ATIVAN) 0.5 MG tablet Take 1 tablet (0.5 mg total) by mouth 2 (two) times daily as needed for anxiety.   Multiple Vitamins-Minerals (OCUVITE ADULT 50+ PO) Take 1 capsule by mouth daily.   Multiple Vitamins-Minerals (ZINC PO) Take by mouth daily.   ondansetron (ZOFRAN-ODT) 4 MG disintegrating tablet Take 1 tablet (4 mg total) by mouth every 8 (eight) hours as needed for nausea or vomiting.   UNABLE TO FIND Take by mouth daily. Vitamin D3, vitamin B, Bioflex   UNABLE TO FIND Take by mouth daily. Ocutive eye vitamins, cinnamon vitamins   Ascorbic Acid (CHEWABLE VITAMIN C PO) Take by mouth daily.   budesonide-formoterol (SYMBICORT) 160-4.5 MCG/ACT inhaler Inhale 2 puffs into the lungs 2 (two) times daily. (Patient not taking: Reported on 02/08/2024)   Lancet Device MISC 1 each by Does not apply route in the morning, at noon, and at bedtime. May substitute to any manufacturer covered by patient's insurance.   lidocaine (LIDODERM) 5 % Place 1-2 patches onto the skin daily. Remove & Discard patch within 12 hours or as directed by MD (Patient  not taking: Reported on 02/08/2024)   methocarbamol (ROBAXIN) 500 MG tablet Take 1.5 tablets (750 mg total) by mouth every 6 (six) hours as needed (use for muscle cramps/pain). (Patient not taking: Reported on 02/08/2024)   No current facility-administered medications on file prior to visit.    Allergies:  Allergies  Allergen Reactions   Carafate [Sucralfate] Anaphylaxis and Hives   Atorvastatin Other (See Comments)    Unknown   Metformin And Related Diarrhea   Other Hives and Other (See Comments)    Lysol Cleaning Products   Penicillins Nausea And Vomiting   Tramadol Nausea Only    Social History:  Social History   Socioeconomic History   Marital status: Married    Spouse name: Not on file   Number of children: Not on file   Years of education: Not on file   Highest education level: Not on file  Occupational History   Not on file  Tobacco Use   Smoking status: Never   Smokeless tobacco: Never  Vaping Use   Vaping status: Never Used  Substance and Sexual Activity   Alcohol use: No   Drug use: No   Sexual activity: Yes  Other Topics Concern   Not on file  Social History Narrative   Not on file   Social Drivers of Health   Financial Resource Strain: Low Risk  (02/08/2024)   Overall Financial Resource Strain (CARDIA)    Difficulty of Paying Living Expenses: Not hard at all  Food Insecurity: No Food Insecurity (02/08/2024)   Hunger Vital Sign    Worried About Running Out of Food in the Last Year: Never true    Ran Out of Food in the Last Year: Never true  Transportation Needs: No Transportation Needs (02/08/2024)   PRAPARE - Administrator, Civil Service (Medical): No    Lack of Transportation (Non-Medical): No  Physical Activity: Sufficiently Active (02/08/2024)   Exercise Vital Sign    Days of Exercise per Week: 7 days    Minutes of Exercise per Session: 30 min  Stress: No Stress Concern Present (02/08/2024)   [employer] of Occupational Health -  Occupational Stress Questionnaire    Feeling of Stress :  Only a little  Social Connections: Socially Integrated (02/08/2024)   Social Connection and Isolation Panel [NHANES]    Frequency of Communication with Friends and Family: More than three times a week    Frequency of Social Gatherings with Friends and Family: More than three times a week    Attends Religious Services: More than 4 times per year    Active Member of [employer] or Organizations: Yes    Attends Engineer, structural: More than 4 times per year    Marital Status: Married  Catering manager Violence: Not At Risk (02/08/2024)   Humiliation, Afraid, Rape, and Kick questionnaire    Fear of Current or Ex-Partner: No    Emotionally Abused: No    Physically Abused: No    Sexually Abused: No   Social History   Tobacco Use  Smoking Status Never  Smokeless Tobacco Never   Social History   Substance and Sexual Activity  Alcohol Use No    Family History:  Family History  Problem Relation Age of Onset   Colon polyps Father    Diabetes Father    Hypertension Father    Heart disease Father    Stroke Father    Diabetes Mother    Hypertension Mother    Diabetes Brother    Heart failure Brother        pacemaker   Hypertension Brother    Heart attack Brother    Migraines Daughter    Lupus Son    Diabetes Maternal Grandmother    Heart disease Maternal Grandmother    Stroke Maternal Grandfather    Hypertension Paternal Grandmother    Stroke Paternal Grandfather    Hyperlipidemia Brother    Breast cancer Neg Hx     Past medical history, surgical history, medications, allergies, family history and social history reviewed with patient today and changes made to appropriate areas of the chart.   Review of Systems  Constitutional: Negative.   HENT: Negative.    Eyes:  Positive for blurred vision. Negative for double vision, photophobia, pain, discharge and redness.  Respiratory: Negative.    Cardiovascular:  Negative.   Gastrointestinal: Negative.   Genitourinary: Negative.   Musculoskeletal: Negative.   Skin: Negative.   Neurological: Negative.   Endo/Heme/Allergies:  Positive for environmental allergies. Negative for polydipsia. Does not bruise/bleed easily.  Psychiatric/Behavioral: Negative.     All other ROS negative except what is listed above and in the HPI.      Objective:    BP 115/64   Pulse 67   Temp (!) 97.5 F (36.4 C) (Oral)   Ht 5\' 2"  (1.575 m)   Wt 123 lb 12.8 oz (56.2 kg)   SpO2 100%   BMI 22.64 kg/m   Wt Readings from Last 3 Encounters:  02/08/24 123 lb 12.8 oz (56.2 kg)  01/28/24 123 lb (55.8 kg)  01/24/24 127 lb 11.2 oz (57.9 kg)    Physical Exam Vitals and nursing note reviewed.  Constitutional:      General: She is not in acute distress.    Appearance: Normal appearance. She is not ill-appearing, toxic-appearing or diaphoretic.  HENT:     Head: Normocephalic and atraumatic.     Right Ear: Tympanic membrane, ear canal and external ear normal. There is no impacted cerumen.     Left Ear: Tympanic membrane, ear canal and external ear normal. There is no impacted cerumen.     Nose: Nose normal. No congestion or rhinorrhea.     Mouth/Throat:  Mouth: Mucous membranes are moist.     Pharynx: Oropharynx is clear. No oropharyngeal exudate or posterior oropharyngeal erythema.  Eyes:     General: No scleral icterus.       Right eye: No discharge.        Left eye: No discharge.     Extraocular Movements: Extraocular movements intact.     Conjunctiva/sclera: Conjunctivae normal.     Pupils: Pupils are equal, round, and reactive to light.  Neck:     Vascular: No carotid bruit.  Cardiovascular:     Rate and Rhythm: Normal rate and regular rhythm.     Pulses: Normal pulses.     Heart sounds: No murmur heard.    No friction rub. No gallop.  Pulmonary:     Effort: Pulmonary effort is normal. No respiratory distress.     Breath sounds: Normal breath sounds. No  stridor. No wheezing, rhonchi or rales.  Chest:     Chest wall: No tenderness.  Abdominal:     General: Abdomen is flat. Bowel sounds are normal. There is no distension.     Palpations: Abdomen is soft. There is no mass.     Tenderness: There is no abdominal tenderness. There is no right CVA tenderness, left CVA tenderness, guarding or rebound.     Hernia: No hernia is present.  Genitourinary:    Comments: Breast and pelvic exams deferred with shared decision making Musculoskeletal:        General: No swelling, tenderness, deformity or signs of injury.     Cervical back: Normal range of motion and neck supple. No rigidity. No muscular tenderness.     Right lower leg: No edema.     Left lower leg: No edema.  Lymphadenopathy:     Cervical: No cervical adenopathy.  Skin:    General: Skin is warm and dry.     Capillary Refill: Capillary refill takes less than 2 seconds.     Coloration: Skin is not jaundiced or pale.     Findings: No bruising, erythema, lesion or rash.  Neurological:     General: No focal deficit present.     Mental Status: She is alert and oriented to person, place, and time. Mental status is at baseline.     Cranial Nerves: No cranial nerve deficit.     Sensory: No sensory deficit.     Motor: No weakness.     Coordination: Coordination normal.     Gait: Gait normal.     Deep Tendon Reflexes: Reflexes normal.  Psychiatric:        Mood and Affect: Mood normal.        Behavior: Behavior normal.        Thought Content: Thought content normal.        Judgment: Judgment normal.     Results for orders placed or performed in visit on 02/08/24  Bayer DCA Hb A1c Waived   Collection Time: 02/08/24  3:13 PM  Result Value Ref Range   HB A1C (BAYER DCA - WAIVED) 8.3 (H) 4.8 - 5.6 %  Microalbumin, Urine Waived   Collection Time: 02/08/24  3:13 PM  Result Value Ref Range   Microalb, Ur Waived 10 0 - 19 mg/L   Creatinine, Urine Waived 100 10 - 300 mg/dL   Microalb/Creat  Ratio <30 <30 mg/g      Assessment & Plan:   Problem List Items Addressed This Visit       Endocrine   DM (diabetes mellitus),  type 2 (HCC)   Tolerating her insulin well. A1c already better down to 8.3 from 12.4. Will increase her tojeou to 7mg  and check tolerance in 6 weeks. DEXCOM ordered. Call with any concerns.       Relevant Medications   insulin glargine, 1 Unit Dial, (TOUJEO SOLOSTAR) 300 UNIT/ML Solostar Pen   simvastatin (ZOCOR) 40 MG tablet   Other Relevant Orders   CBC with Differential/Platelet   Comprehensive metabolic panel with GFR   Lipid Panel w/o Chol/HDL Ratio   TSH   Bayer DCA Hb A1c Waived (Completed)   Microalbumin, Urine Waived (Completed)     Other   Hyperlipidemia   Under good control on current regimen. Continue current regimen. Continue to monitor. Call with any concerns. Refills given. Labs drawn today.        Relevant Medications   simvastatin (ZOCOR) 40 MG tablet   Anxiety   Under good control on current regimen. Continue current regimen. Continue to monitor. Call with any concerns. Refills given.        Relevant Medications   escitalopram (LEXAPRO) 20 MG tablet   Other Visit Diagnoses       Encounter for annual wellness exam in Medicare patient    -  Primary   Preventative care discussed today.     Routine general medical examination at a health care facility       Vaccines up to date/declined. Screening labs checked today. Mammo ordered. Colonoscopy and DEXA up to date. Continue diet and exercise. Call with any concerns.     History of UTI       Will recheck UA. Await results.   Relevant Orders   Urinalysis, Routine w reflex microscopic     Encounter for screening mammogram for malignant neoplasm of breast       Mammo ordered today.   Relevant Orders   MM 3D SCREENING MAMMOGRAM BILATERAL BREAST        Follow up plan: Return in about 6 weeks (around 03/21/2024).   LABORATORY TESTING:  - Pap smear: not  applicable  IMMUNIZATIONS:   - Tdap: Tetanus vaccination status reviewed: last tetanus booster within 10 years. - Influenza: Up to date - Pneumovax: Up to date - Prevnar: Up to date - COVID: Not applicable - HPV: Not applicable - Shingrix vaccine: Up to date  SCREENING: -Mammogram: Ordered today  - Colonoscopy: Up to date  - Bone Density: Up to date   PATIENT COUNSELING:   Advised to take 1 mg of folate supplement per day if capable of pregnancy.   Sexuality: Discussed sexually transmitted diseases, partner selection, use of condoms, avoidance of unintended pregnancy  and contraceptive alternatives.   Advised to avoid cigarette smoking.  I discussed with the patient that most people either abstain from alcohol or drink within safe limits (<=14/week and <=4 drinks/occasion for males, <=7/weeks and <= 3 drinks/occasion for females) and that the risk for alcohol disorders and other health effects rises proportionally with the number of drinks per week and how often a drinker exceeds daily limits.  Discussed cessation/primary prevention of drug use and availability of treatment for abuse.   Diet: Encouraged to adjust caloric intake to maintain  or achieve ideal body weight, to reduce intake of dietary saturated fat and total fat, to limit sodium intake by avoiding high sodium foods and not adding table salt, and to maintain adequate dietary potassium and calcium preferably from fresh fruits, vegetables, and low-fat dairy products.    stressed the importance  of regular exercise  Injury prevention: Discussed safety belts, safety helmets, smoke detector, smoking near bedding or upholstery.   Dental health: Discussed importance of regular tooth brushing, flossing, and dental visits.    NEXT PREVENTATIVE PHYSICAL DUE IN 1 YEAR. Return in about 6 weeks (around 03/21/2024).

## 2024-02-08 NOTE — Patient Instructions (Signed)
  Ms. Mccraw , Thank you for taking time to come for your Medicare Wellness Visit. I appreciate your ongoing commitment to your health goals. Please review the following plan we discussed and let me know if I can assist you in the future.   These are the goals we discussed:  Goals   None     This is a list of the screening recommended for you and due dates:  Health Maintenance  Topic Date Due   Complete foot exam   08/05/2023   Yearly kidney health urinalysis for diabetes  02/02/2024   Eye exam for diabetics  05/30/2024   Flu Shot  06/06/2024   Hemoglobin A1C  07/26/2024   Yearly kidney function blood test for diabetes  01/22/2025   Medicare Annual Wellness Visit  02/07/2025   Colon Cancer Screening  07/01/2025   Mammogram  07/22/2025   DEXA scan (bone density measurement)  07/22/2026   DTaP/Tdap/Td vaccine (3 - Td or Tdap) 01/12/2028   Pneumonia Vaccine  Completed   Hepatitis C Screening  Completed   Zoster (Shingles) Vaccine  Completed   HPV Vaccine  Aged Out   COVID-19 Vaccine  Discontinued

## 2024-02-08 NOTE — Assessment & Plan Note (Signed)
 Under good control on current regimen. Continue current regimen. Continue to monitor. Call with any concerns. Refills given. Labs drawn today.

## 2024-02-09 LAB — CBC WITH DIFFERENTIAL/PLATELET
Basophils Absolute: 0 10*3/uL (ref 0.0–0.2)
Basos: 0 %
EOS (ABSOLUTE): 0.1 10*3/uL (ref 0.0–0.4)
Eos: 2 %
Hematocrit: 35.3 % (ref 34.0–46.6)
Hemoglobin: 11.5 g/dL (ref 11.1–15.9)
Immature Grans (Abs): 0 10*3/uL (ref 0.0–0.1)
Immature Granulocytes: 0 %
Lymphocytes Absolute: 2.3 10*3/uL (ref 0.7–3.1)
Lymphs: 30 %
MCH: 33 pg (ref 26.6–33.0)
MCHC: 32.6 g/dL (ref 31.5–35.7)
MCV: 101 fL — ABNORMAL HIGH (ref 79–97)
Monocytes Absolute: 0.6 10*3/uL (ref 0.1–0.9)
Monocytes: 7 %
Neutrophils Absolute: 4.5 10*3/uL (ref 1.4–7.0)
Neutrophils: 61 %
Platelets: 280 10*3/uL (ref 150–450)
RBC: 3.49 x10E6/uL — ABNORMAL LOW (ref 3.77–5.28)
RDW: 13.2 % (ref 11.7–15.4)
WBC: 7.4 10*3/uL (ref 3.4–10.8)

## 2024-02-09 LAB — LIPID PANEL W/O CHOL/HDL RATIO
Cholesterol, Total: 142 mg/dL (ref 100–199)
HDL: 37 mg/dL — ABNORMAL LOW (ref >39)
LDL Chol Calc (NIH): 87 mg/dL (ref 0–99)
Triglycerides: 97 mg/dL (ref 0–149)
VLDL Cholesterol Cal: 18 mg/dL (ref 5–40)

## 2024-02-09 LAB — COMPREHENSIVE METABOLIC PANEL WITH GFR
ALT: 37 [IU]/L — ABNORMAL HIGH (ref 0–32)
AST: 37 [IU]/L (ref 0–40)
Albumin: 4.1 g/dL (ref 3.9–4.9)
Alkaline Phosphatase: 59 [IU]/L (ref 44–121)
BUN/Creatinine Ratio: 14 (ref 12–28)
BUN: 10 mg/dL (ref 8–27)
Bilirubin Total: 0.5 mg/dL (ref 0.0–1.2)
CO2: 21 mmol/L (ref 20–29)
Calcium: 9.5 mg/dL (ref 8.7–10.3)
Chloride: 99 mmol/L (ref 96–106)
Creatinine, Ser: 0.69 mg/dL (ref 0.57–1.00)
Globulin, Total: 2.7 g/dL (ref 1.5–4.5)
Glucose: 91 mg/dL (ref 70–99)
Potassium: 4.1 mmol/L (ref 3.5–5.2)
Sodium: 138 mmol/L (ref 134–144)
Total Protein: 6.8 g/dL (ref 6.0–8.5)
eGFR: 96 mL/min/{1.73_m2}

## 2024-02-09 LAB — TSH: TSH: 0.518 u[IU]/mL (ref 0.450–4.500)

## 2024-02-11 ENCOUNTER — Other Ambulatory Visit: Payer: Self-pay | Admitting: Family Medicine

## 2024-02-11 ENCOUNTER — Encounter: Payer: Self-pay | Admitting: Family Medicine

## 2024-02-11 DIAGNOSIS — D649 Anemia, unspecified: Secondary | ICD-10-CM

## 2024-02-13 NOTE — Progress Notes (Signed)
 Called patient left voice message for patient to call office and schedule 1 week follow up for labs.

## 2024-02-14 ENCOUNTER — Other Ambulatory Visit: Payer: Self-pay | Admitting: Family Medicine

## 2024-02-14 NOTE — Telephone Encounter (Unsigned)
 Copied from CRM [id number]. Topic: Clinical - Prescription Issue >> Feb 14, 2024  2:25 PM Tiffany S wrote: Reason for CRM: Patient called needs a prescription for blood glucose meter please follow up  With patient

## 2024-02-15 ENCOUNTER — Encounter: Payer: Self-pay | Admitting: Family Medicine

## 2024-02-15 MED ORDER — BLOOD GLUCOSE MONITORING SUPPL DEVI: 1.0000 | Freq: Three times a day (TID) | 0 refills | Status: AC

## 2024-02-15 MED ORDER — BLOOD GLUCOSE MONITOR KIT: PACK | 0 refills | Status: AC

## 2024-02-18 ENCOUNTER — Other Ambulatory Visit

## 2024-02-19 ENCOUNTER — Other Ambulatory Visit

## 2024-02-19 ENCOUNTER — Other Ambulatory Visit: Payer: Self-pay

## 2024-02-19 ENCOUNTER — Telehealth: Payer: Self-pay | Admitting: Family Medicine

## 2024-02-19 DIAGNOSIS — D649 Anemia, unspecified: Secondary | ICD-10-CM | POA: Diagnosis not present

## 2024-02-19 NOTE — Telephone Encounter (Signed)
 Patient is aware the Brianna Leon 2/3 is pending with provider.

## 2024-02-19 NOTE — Telephone Encounter (Signed)
 Copied from CRM [id number]. Topic: Clinical - Medication Question >> Feb 19, 2024  4:05 PM Geneva B wrote: Reason for CRM: patient was told to take [facility] in place of her dexcom but she has received a rx for it please call patient  [phone]

## 2024-02-20 LAB — IRON AND TIBC
Iron Saturation: 25 % (ref 15–55)
Iron: 72 ug/dL (ref 27–139)
Total Iron Binding Capacity: 285 ug/dL (ref 250–450)
UIBC: 213 ug/dL (ref 118–369)

## 2024-02-20 LAB — CBC WITH DIFFERENTIAL/PLATELET
Basophils Absolute: 0 10*3/uL (ref 0.0–0.2)
Basos: 1 %
EOS (ABSOLUTE): 0.1 10*3/uL (ref 0.0–0.4)
Eos: 2 %
Hematocrit: 39 % (ref 34.0–46.6)
Hemoglobin: 13.3 g/dL (ref 11.1–15.9)
Immature Grans (Abs): 0 10*3/uL (ref 0.0–0.1)
Immature Granulocytes: 0 %
Lymphocytes Absolute: 1.8 10*3/uL (ref 0.7–3.1)
Lymphs: 35 %
MCH: 35.1 pg — ABNORMAL HIGH (ref 26.6–33.0)
MCHC: 34.1 g/dL (ref 31.5–35.7)
MCV: 103 fL — ABNORMAL HIGH (ref 79–97)
Monocytes Absolute: 0.6 10*3/uL (ref 0.1–0.9)
Monocytes: 12 %
Neutrophils Absolute: 2.5 10*3/uL (ref 1.4–7.0)
Neutrophils: 50 %
Platelets: 215 10*3/uL (ref 150–450)
RBC: 3.79 x10E6/uL (ref 3.77–5.28)
RDW: 12.4 % (ref 11.7–15.4)
WBC: 5 10*3/uL (ref 3.4–10.8)

## 2024-02-20 LAB — FERRITIN: Ferritin: 318 ng/mL — ABNORMAL HIGH (ref 15–150)

## 2024-02-20 LAB — VITAMIN B12: Vitamin B-12: >2000 pg/mL — ABNORMAL HIGH (ref 232–1245)

## 2024-02-20 MED ORDER — FREESTYLE LIBRE 3 SENSOR MISC: 1.0000 [IU] | 3 refills | Status: DC

## 2024-02-26 ENCOUNTER — Encounter: Payer: Self-pay | Admitting: Family Medicine

## 2024-03-17 ENCOUNTER — Other Ambulatory Visit: Payer: Self-pay | Admitting: Nurse Practitioner

## 2024-03-17 DIAGNOSIS — R809 Proteinuria, unspecified: Secondary | ICD-10-CM

## 2024-03-19 ENCOUNTER — Other Ambulatory Visit: Payer: Self-pay | Admitting: Family Medicine

## 2024-03-19 DIAGNOSIS — R809 Proteinuria, unspecified: Secondary | ICD-10-CM

## 2024-03-19 NOTE — Telephone Encounter (Unsigned)
 Copied from CRM [id number]. Topic: Clinical - Medication Refill >> Mar 19, 2024  3:10 PM Fredrica W wrote: Medication: insulin  glargine, 1 Unit Dial, (TOUJEO  SOLOSTAR) 300 UNIT/ML Solostar Pen  Has the patient contacted their pharmacy? Yes (Agent: If no, request that the patient contact the pharmacy for the refill. If patient does not wish to contact the pharmacy document the reason why and proceed with request.) (Agent: If yes, when and what did the pharmacy advise?) Need New Rx  This is the patient's preferred pharmacy:  [id number] [place], [place] - [address] AT [facility] [address] [place] [address] Phone: [phone] Fax: [phone]  Is this the correct pharmacy for this prescription? Yes If no, delete pharmacy and type the correct one.   Has the prescription been filled recently? Yes originally prescribed by [facility]  Is the patient out of the medication? No - enough for today and tomorrow   Has the patient been seen for an appointment in the last year OR does the patient have an upcoming appointment? Yes  Can we respond through MyChart? Yes  Agent: Please be advised that Rx refills may take up to 3 business days. We ask that you follow-up with your pharmacy.

## 2024-03-19 NOTE — Telephone Encounter (Signed)
 Requested medication (s) are due for refill today:   Not sure  Requested medication (s) are on the active medication list:   Yes  Future visit scheduled:   Yes 5/23 with Dr. Lincoln Renshaw     LOV 02/08/2024   Last ordered: 02/08/2024 1.5 ml, 12 refills  Refill being requested.   For F/U appt on 5/23 so wasn't sure how this is to be handled.    Has 12 refills.    Dr. Lincoln Renshaw to review.   Requested Prescriptions  Pending Prescriptions Disp Refills   TOUJEO  SOLOSTAR 300 UNIT/ML Solostar Pen [Pharmacy Med Name: TOUJEO  SOLOSTAR 300U/ML PEN 1.5ML] 1.5 mL 12    Sig: ADMINISTER 5 UNITS UNDER THE SKIN DAILY. TOTAL DAILY DOSE= 0.2-0.5 UNITS/ KG PER DAY---> 50% BASAL/ 50% BOLUS     Endocrinology:  Diabetes - Insulins Failed - 03/19/2024  9:31 AM      Failed - HBA1C is between 0 and 7.9 and within 180 days    Hemoglobin A1C  Date Value Ref Range Status  04/03/2017 per CE  Final  04/03/2017 9.4  Final   HB A1C (BAYER DCA - WAIVED)  Date Value Ref Range Status  02/08/2024 8.3 (H) 4.8 - 5.6 % Final    Comment:             Prediabetes: 5.7 - 6.4          Diabetes: >6.4          Glycemic control for adults with diabetes: <7.0          Passed - Valid encounter within last 6 months    Recent Outpatient Visits           1 month ago Encounter for annual wellness exam in Medicare patient   [place] [facility] [place], Megan P, DO   1 month ago Hyperglycemia   [facility] Donny Gall F, FNP   3 months ago Moderate persistent reactive airway disease with acute exacerbation   [place] [facility] [place], Megan P, DO

## 2024-03-21 NOTE — Telephone Encounter (Signed)
 Call to pharmacy- patient has RF at pharmacy Requested Prescriptions  Pending Prescriptions Disp Refills   insulin  glargine, 1 Unit Dial, (TOUJEO  SOLOSTAR) 300 UNIT/ML Solostar Pen 1.5 mL 12    Sig: Inject 7 Units into the skin daily.     Endocrinology:  Diabetes - Insulins Failed - 03/21/2024  9:41 AM      Failed - HBA1C is between 0 and 7.9 and within 180 days    Hemoglobin A1C  Date Value Ref Range Status  04/03/2017 per CE  Final  04/03/2017 9.4  Final   HB A1C (BAYER DCA - WAIVED)  Date Value Ref Range Status  02/08/2024 8.3 (H) 4.8 - 5.6 % Final    Comment:             Prediabetes: 5.7 - 6.4          Diabetes: >6.4          Glycemic control for adults with diabetes: <7.0          Passed - Valid encounter within last 6 months    Recent Outpatient Visits           1 month ago Encounter for annual wellness exam in Medicare patient   [place] [facility] [place], Megan P, DO   1 month ago Hyperglycemia   [facility] Donny Gall F, FNP   3 months ago Moderate persistent reactive airway disease with acute exacerbation   [place] [facility] [place], Megan P, DO

## 2024-03-28 ENCOUNTER — Encounter: Payer: Self-pay | Admitting: Family Medicine

## 2024-03-28 ENCOUNTER — Ambulatory Visit (INDEPENDENT_AMBULATORY_CARE_PROVIDER_SITE_OTHER): Admitting: Family Medicine

## 2024-03-28 VITALS — BP 112/65 | HR 81 | Temp 97.9°F | Ht 62.0 in | Wt 120.8 lb

## 2024-03-28 DIAGNOSIS — R809 Proteinuria, unspecified: Secondary | ICD-10-CM | POA: Diagnosis not present

## 2024-03-28 DIAGNOSIS — E1129 Type 2 diabetes mellitus with other diabetic kidney complication: Secondary | ICD-10-CM

## 2024-03-28 DIAGNOSIS — Z794 Long term (current) use of insulin: Secondary | ICD-10-CM

## 2024-03-28 MED ORDER — GLUCAGON EMERGENCY 1 MG/ML IJ SOLR
1.0000 mg | Freq: Once | INTRAMUSCULAR | 12 refills | Status: AC | PRN
Start: 2024-03-28 — End: ?

## 2024-03-28 NOTE — Assessment & Plan Note (Signed)
 Has had a couple of lows. Rx for glucacon sent to her pharmacy. Continue current regimen. Recheck in 6 weeks. Call with any concerns.

## 2024-03-28 NOTE — Progress Notes (Signed)
 BP 112/65   Pulse 81   Temp 97.9 F (36.6 C) (Oral)   Ht 5\' 2"  (1.575 m)   Wt 120 lb 12.8 oz (54.8 kg)   SpO2 97%   BMI 22.09 kg/m    Subjective:    Patient ID: Brianna Leon, female    DOB: [birth year], 68 y.o.   MRN: [id number]  HPI: Brianna Leon is a 69 y.o. female  Chief Complaint  Patient presents with   Diabetes    6 week f/up   DIABETES Hypoglycemic episodes: 48- middle of the night, in the 60s Polydipsia/polyuria: no Visual disturbance: no Chest pain: no Paresthesias: no Glucose Monitoring: yes  Accucheck frequency: continuous  Fasting glucose: 120-150  Post prandial: 250s Taking Insulin ?: yes  Long acting insulin : 7 units Blood Pressure Monitoring: not checking Retinal Examination: Up to Date Foot Exam: Up to Date Diabetic Education: Completed Pneumovax: Up to Date Influenza: Up to Date Aspirin: yes  Relevant past medical, surgical, family and social history reviewed and updated as indicated. Interim medical history since our last visit reviewed. Allergies and medications reviewed and updated.  Review of Systems  Constitutional: Negative.   Respiratory: Negative.    Cardiovascular: Negative.   Musculoskeletal: Negative.   Neurological: Negative.   Psychiatric/Behavioral: Negative.      Per HPI unless specifically indicated above     Objective:     BP 112/65   Pulse 81   Temp 97.9 F (36.6 C) (Oral)   Ht 5\' 2"  (1.575 m)   Wt 120 lb 12.8 oz (54.8 kg)   SpO2 97%   BMI 22.09 kg/m   Wt Readings from Last 3 Encounters:  03/28/24 120 lb 12.8 oz (54.8 kg)  02/08/24 123 lb 12.8 oz (56.2 kg)  01/28/24 123 lb (55.8 kg)    Physical Exam Vitals and nursing note reviewed.  Constitutional:      General: She is not in acute distress.    Appearance: Normal appearance. She is not ill-appearing, toxic-appearing or diaphoretic.  HENT:     Head: Normocephalic and atraumatic.     Right Ear: External ear normal.     Left Ear: External ear  normal.     Nose: Nose normal.     Mouth/Throat:     Mouth: Mucous membranes are moist.     Pharynx: Oropharynx is clear.  Eyes:     General: No scleral icterus.       Right eye: No discharge.        Left eye: No discharge.     Extraocular Movements: Extraocular movements intact.     Conjunctiva/sclera: Conjunctivae normal.     Pupils: Pupils are equal, round, and reactive to light.  Cardiovascular:     Rate and Rhythm: Normal rate and regular rhythm.     Pulses: Normal pulses.     Heart sounds: Normal heart sounds. No murmur heard.    No friction rub. No gallop.  Pulmonary:     Effort: Pulmonary effort is normal. No respiratory distress.     Breath sounds: Normal breath sounds. No stridor. No wheezing, rhonchi or rales.  Chest:     Chest wall: No tenderness.  Musculoskeletal:        General: Normal range of motion.     Cervical back: Normal range of motion and neck supple.  Skin:    General: Skin is warm and dry.     Capillary Refill: Capillary refill takes less than 2 seconds.  Coloration: Skin is not jaundiced or pale.     Findings: No bruising, erythema, lesion or rash.  Neurological:     General: No focal deficit present.     Mental Status: She is alert and oriented to person, place, and time. Mental status is at baseline.  Psychiatric:        Mood and Affect: Mood normal.        Behavior: Behavior normal.        Thought Content: Thought content normal.        Judgment: Judgment normal.     Results for orders placed or performed in visit on 02/19/24  B12   Collection Time: 02/19/24 10:10 AM  Result Value Ref Range   Vitamin B-12 >2000 (H) 232 - 1245 pg/mL  Iron Binding Cap (TIBC)([facility])   Collection Time: 02/19/24 10:10 AM  Result Value Ref Range   Total Iron Binding Capacity 285 250 - 450 ug/dL   UIBC 478 295 - 621 ug/dL   Iron 72 27 - 308 ug/dL   Iron Saturation 25 15 - 55 %  Ferritin   Collection Time: 02/19/24 10:10 AM  Result Value Ref  Range   Ferritin 318 (H) 15 - 150 ng/mL  CBC with Differential/Platelet   Collection Time: 02/19/24 10:10 AM  Result Value Ref Range   WBC 5.0 3.4 - 10.8 x10E3/uL   RBC 3.79 3.77 - 5.28 x10E6/uL   Hemoglobin 13.3 11.1 - 15.9 g/dL   Hematocrit 65.7 84.6 - 46.6 %   MCV 103 (H) 79 - 97 fL   MCH 35.1 (H) 26.6 - 33.0 pg   MCHC 34.1 31.5 - 35.7 g/dL   RDW 96.2 95.2 - 84.1 %   Platelets 215 150 - 450 x10E3/uL   Neutrophils 50 Not Estab. %   Lymphs 35 Not Estab. %   Monocytes 12 Not Estab. %   Eos 2 Not Estab. %   Basos 1 Not Estab. %   Neutrophils Absolute 2.5 1.4 - 7.0 x10E3/uL   Lymphocytes Absolute 1.8 0.7 - 3.1 x10E3/uL   Monocytes Absolute 0.6 0.1 - 0.9 x10E3/uL   EOS (ABSOLUTE) 0.1 0.0 - 0.4 x10E3/uL   Basophils Absolute 0.0 0.0 - 0.2 x10E3/uL   Immature Granulocytes 0 Not Estab. %   Immature Grans (Abs) 0.0 0.0 - 0.1 x10E3/uL      Assessment & Plan:   Problem List Items Addressed This Visit       Endocrine   DM (diabetes mellitus), type 2 (HCC) - Primary   Has had a couple of lows. Rx for glucacon sent to her pharmacy. Continue current regimen. Recheck in 6 weeks. Call with any concerns.       Relevant Medications   Glucagon HCl (GLUCAGON EMERGENCY) 1 MG/ML SOLR     Follow up plan: Return in about 6 weeks (around 05/09/2024).

## 2024-04-21 ENCOUNTER — Other Ambulatory Visit (HOSPITAL_COMMUNITY): Payer: Self-pay

## 2024-04-21 ENCOUNTER — Telehealth: Payer: Self-pay

## 2024-04-21 NOTE — Telephone Encounter (Signed)
 Pharmacy Patient Advocate Encounter   Received notification from CoverMyMeds that prior authorization for Rybelsus  3mg  is required/requested.   Insurance verification completed.   The patient is insured through [employer] ADVANTAGE/RX ADVANCE .   Per test claim: The current 30 day co-pay is, $0.00.  No PA needed at this time. This test claim was processed through [facility]- copay amounts may vary at other pharmacies due to pharmacy/plan contracts, or as the patient moves through the different stages of their insurance plan.

## 2024-04-28 ENCOUNTER — Encounter: Payer: Self-pay | Admitting: Family Medicine

## 2024-05-16 ENCOUNTER — Ambulatory Visit: Admitting: Family Medicine

## 2024-05-19 ENCOUNTER — Encounter: Payer: Self-pay | Admitting: Family Medicine

## 2024-05-19 ENCOUNTER — Ambulatory Visit (INDEPENDENT_AMBULATORY_CARE_PROVIDER_SITE_OTHER): Admitting: Family Medicine

## 2024-05-19 VITALS — BP 117/72 | HR 73 | Temp 98.0°F | Ht 62.0 in | Wt 125.4 lb

## 2024-05-19 DIAGNOSIS — Z794 Long term (current) use of insulin: Secondary | ICD-10-CM | POA: Diagnosis not present

## 2024-05-19 DIAGNOSIS — R809 Proteinuria, unspecified: Secondary | ICD-10-CM | POA: Diagnosis not present

## 2024-05-19 DIAGNOSIS — E1129 Type 2 diabetes mellitus with other diabetic kidney complication: Secondary | ICD-10-CM

## 2024-05-19 LAB — BAYER DCA HB A1C WAIVED: HB A1C (BAYER DCA - WAIVED): 6.5 % — ABNORMAL HIGH (ref 4.8–5.6)

## 2024-05-19 NOTE — Assessment & Plan Note (Signed)
 Doing great with A1c of 6.5 down from 8.3. Continue current regimen. Continue to monitor. Call with any concerns.

## 2024-05-19 NOTE — Progress Notes (Signed)
 BP 117/72   Pulse 73   Temp 98 F (36.7 C) (Oral)   Ht 5' 2 (1.575 m)   Wt 125 lb 6.4 oz (56.9 kg)   SpO2 98%   BMI 22.94 kg/m    Subjective:    Patient ID: Brianna Leon, female    DOB: [birth year], 68 y.o.   MRN: [id number]  HPI: Brianna Leon is a 68 y.o. female  Chief Complaint  Patient presents with   Diabetes   DIABETES Hypoglycemic episodes:no Polydipsia/polyuria: no Visual disturbance: no Chest pain: no Paresthesias: no Glucose Monitoring: yes  Accucheck frequency: continous  Fasting glucose: 100-150  Evening: 186, 250 Taking Insulin ?: yes  Long acting insulin : 7 units of toujeo  Blood Pressure Monitoring: rarely Retinal Examination: Up to Date Foot Exam: Up to Date Diabetic Education: Completed Pneumovax: Up to Date Influenza: Up to Date Aspirin: yes  Relevant past medical, surgical, family and social history reviewed and updated as indicated. Interim medical history since our last visit reviewed. Allergies and medications reviewed and updated.  Review of Systems  Constitutional: Negative.   Respiratory: Negative.    Cardiovascular: Negative.   Musculoskeletal: Negative.   Neurological: Negative.   Psychiatric/Behavioral: Negative.      Per HPI unless specifically indicated above     Objective:    BP 117/72   Pulse 73   Temp 98 F (36.7 C) (Oral)   Ht 5' 2 (1.575 m)   Wt 125 lb 6.4 oz (56.9 kg)   SpO2 98%   BMI 22.94 kg/m   Wt Readings from Last 3 Encounters:  05/19/24 125 lb 6.4 oz (56.9 kg)  03/28/24 120 lb 12.8 oz (54.8 kg)  02/08/24 123 lb 12.8 oz (56.2 kg)    Physical Exam Vitals and nursing note reviewed.  Constitutional:      General: She is not in acute distress.    Appearance: Normal appearance. She is not ill-appearing, toxic-appearing or diaphoretic.  HENT:     Head: Normocephalic and atraumatic.     Right Ear: External ear normal.     Left Ear: External ear normal.     Nose: Nose normal.     Mouth/Throat:      Mouth: Mucous membranes are moist.     Pharynx: Oropharynx is clear.  Eyes:     General: No scleral icterus.       Right eye: No discharge.        Left eye: No discharge.     Extraocular Movements: Extraocular movements intact.     Conjunctiva/sclera: Conjunctivae normal.     Pupils: Pupils are equal, round, and reactive to light.  Cardiovascular:     Rate and Rhythm: Normal rate and regular rhythm.     Pulses: Normal pulses.     Heart sounds: Normal heart sounds. No murmur heard.    No friction rub. No gallop.  Pulmonary:     Effort: Pulmonary effort is normal. No respiratory distress.     Breath sounds: Normal breath sounds. No stridor. No wheezing, rhonchi or rales.  Chest:     Chest wall: No tenderness.  Musculoskeletal:        General: Normal range of motion.     Cervical back: Normal range of motion and neck supple.  Skin:    General: Skin is warm and dry.     Capillary Refill: Capillary refill takes less than 2 seconds.     Coloration: Skin is not jaundiced or pale.  Findings: No bruising, erythema, lesion or rash.  Neurological:     General: No focal deficit present.     Mental Status: She is alert and oriented to person, place, and time. Mental status is at baseline.  Psychiatric:        Mood and Affect: Mood normal.        Behavior: Behavior normal.        Thought Content: Thought content normal.        Judgment: Judgment normal.     Results for orders placed or performed in visit on 02/19/24  B12   Collection Time: 02/19/24 10:10 AM  Result Value Ref Range   Vitamin B-12 >2000 (H) 232 - 1245 pg/mL  Iron Binding Cap (TIBC)([facility])   Collection Time: 02/19/24 10:10 AM  Result Value Ref Range   Total Iron Binding Capacity 285 250 - 450 ug/dL   UIBC 786 881 - 630 ug/dL   Iron 72 27 - 860 ug/dL   Iron Saturation 25 15 - 55 %  Ferritin   Collection Time: 02/19/24 10:10 AM  Result Value Ref Range   Ferritin 318 (H) 15 - 150 ng/mL  CBC with  Differential/Platelet   Collection Time: 02/19/24 10:10 AM  Result Value Ref Range   WBC 5.0 3.4 - 10.8 x10E3/uL   RBC 3.79 3.77 - 5.28 x10E6/uL   Hemoglobin 13.3 11.1 - 15.9 g/dL   Hematocrit 60.9 65.9 - 46.6 %   MCV 103 (H) 79 - 97 fL   MCH 35.1 (H) 26.6 - 33.0 pg   MCHC 34.1 31.5 - 35.7 g/dL   RDW 87.5 88.2 - 84.5 %   Platelets 215 150 - 450 x10E3/uL   Neutrophils 50 Not Estab. %   Lymphs 35 Not Estab. %   Monocytes 12 Not Estab. %   Eos 2 Not Estab. %   Basos 1 Not Estab. %   Neutrophils Absolute 2.5 1.4 - 7.0 x10E3/uL   Lymphocytes Absolute 1.8 0.7 - 3.1 x10E3/uL   Monocytes Absolute 0.6 0.1 - 0.9 x10E3/uL   EOS (ABSOLUTE) 0.1 0.0 - 0.4 x10E3/uL   Basophils Absolute 0.0 0.0 - 0.2 x10E3/uL   Immature Granulocytes 0 Not Estab. %   Immature Grans (Abs) 0.0 0.0 - 0.1 x10E3/uL      Assessment & Plan:   Problem List Items Addressed This Visit       Endocrine   DM (diabetes mellitus), type 2 (HCC) - Primary   Doing great with A1c of 6.5 down from 8.3. Continue current regimen. Continue to monitor. Call with any concerns.       Relevant Orders   Bayer DCA Hb A1c Waived     Follow up plan: Return in about 3 months (around 08/19/2024).

## 2024-05-21 ENCOUNTER — Ambulatory Visit: Admitting: Family Medicine

## 2024-06-02 DIAGNOSIS — H2513 Age-related nuclear cataract, bilateral: Secondary | ICD-10-CM | POA: Diagnosis not present

## 2024-06-02 DIAGNOSIS — H353131 Nonexudative age-related macular degeneration, bilateral, early dry stage: Secondary | ICD-10-CM | POA: Diagnosis not present

## 2024-06-02 DIAGNOSIS — H40013 Open angle with borderline findings, low risk, bilateral: Secondary | ICD-10-CM | POA: Diagnosis not present

## 2024-06-02 DIAGNOSIS — H35363 Drusen (degenerative) of macula, bilateral: Secondary | ICD-10-CM | POA: Diagnosis not present

## 2024-06-02 DIAGNOSIS — H5203 Hypermetropia, bilateral: Secondary | ICD-10-CM | POA: Diagnosis not present

## 2024-06-02 DIAGNOSIS — E119 Type 2 diabetes mellitus without complications: Secondary | ICD-10-CM | POA: Diagnosis not present

## 2024-06-02 LAB — HM DIABETES EYE EXAM

## 2024-06-03 ENCOUNTER — Encounter: Payer: Self-pay | Admitting: Family Medicine

## 2024-06-06 DIAGNOSIS — Z4689 Encounter for fitting and adjustment of other specified devices: Secondary | ICD-10-CM | POA: Diagnosis not present

## 2024-06-06 DIAGNOSIS — N8111 Cystocele, midline: Secondary | ICD-10-CM | POA: Diagnosis not present

## 2024-06-17 ENCOUNTER — Other Ambulatory Visit: Payer: Self-pay | Admitting: Pharmacist

## 2024-06-17 DIAGNOSIS — R809 Proteinuria, unspecified: Secondary | ICD-10-CM

## 2024-06-17 NOTE — Progress Notes (Signed)
 Pharmacy Quality Measure Review  This patient is appearing on a report for being at risk of failing the adherence measure for cholesterol (statin) medications this calendar year.   Medication: simvastatin  40 mg daily  Last fill date: 01/21/24 for 90 day supply  Patient reports that previously, she had been nonadherent to simvastatin  and has a supply at home. Notes she has been taking every evening.   She also reports that over the past few weeks, her sugars have been more elevated than at her last PCP appointment. Per Herlene, she reports readings in the 250-300s on Toujeo  7 units daily.   Tells me that she was previously on Rybelsus , Jardiance , and Trulicity , and denies any intolerability concerns - she remembers that she either ran out of refills and so stopped taking the medications, or that her sugars were well controlled so these agents were stopped.   Recommend consideration for re-initiation of Trulicity . Patient reports she thinks she would be able to remember a weekly injectable at this time. She notes that her daughter is involved in her care.   Will notify PCP and embedded pharmacist.   [facility], PharmD, [facility] Clinical Pharmacist [phone]

## 2024-06-19 ENCOUNTER — Ambulatory Visit: Payer: Self-pay

## 2024-06-19 ENCOUNTER — Telehealth: Payer: Self-pay | Admitting: Family Medicine

## 2024-06-19 NOTE — Telephone Encounter (Signed)
 Copied from CRM [id number]. Topic: General - Other >> Jun 19, 2024 12:33 PM Zebedee SAUNDERS wrote: Reason for CRM: Pt would like a call [phone] back regarding her feeling tried since medication change.

## 2024-06-19 NOTE — Telephone Encounter (Signed)
 Patient called, no answer, mailbox is full. Routing to the office for provider to review.   Copied from CRM #[id number]. Topic: Clinical - Red Word Triage >> Jun 19, 2024  5:59 PM Brianna Leon wrote: Red Word that prompted transfer to Nurse Triage: Blood sugar 194 and earlier 256 pt feels, tried.

## 2024-06-19 NOTE — Addendum Note (Signed)
 Addended by: VICCI DUWAINE SQUIBB on: 06/19/2024 11:11 AM   Modules accepted: Orders

## 2024-06-20 ENCOUNTER — Telehealth: Payer: Self-pay | Admitting: Family Medicine

## 2024-06-20 ENCOUNTER — Encounter: Payer: Self-pay | Admitting: Family Medicine

## 2024-06-20 ENCOUNTER — Ambulatory Visit: Admitting: Family Medicine

## 2024-06-20 VITALS — BP 130/76 | HR 81 | Temp 97.8°F | Resp 16 | Ht 62.01 in | Wt 128.6 lb

## 2024-06-20 DIAGNOSIS — R809 Proteinuria, unspecified: Secondary | ICD-10-CM | POA: Diagnosis not present

## 2024-06-20 DIAGNOSIS — E1129 Type 2 diabetes mellitus with other diabetic kidney complication: Secondary | ICD-10-CM

## 2024-06-20 MED ORDER — EMPAGLIFLOZIN 25 MG PO TABS: 25.0000 mg | ORAL_TABLET | Freq: Every day | ORAL | 3 refills | Status: DC

## 2024-06-20 MED ORDER — TRULICITY 0.75 MG/0.5ML ~~LOC~~ SOAJ
0.7500 mg | SUBCUTANEOUS | 0 refills | Status: DC
Start: 2024-06-20 — End: 2024-09-08

## 2024-06-20 MED ORDER — TRULICITY 1.5 MG/0.5ML ~~LOC~~ SOAJ
1.5000 mg | SUBCUTANEOUS | 0 refills | Status: DC
Start: 2024-07-18 — End: 2024-09-08

## 2024-06-20 NOTE — Telephone Encounter (Signed)
 Called patient after CRM received about blood sugar concerns and was able to get patient scheduled for a visit today.

## 2024-06-20 NOTE — Telephone Encounter (Signed)
 FYI Only or Action Required?: FYI only for provider.  Patient was last seen in primary care on 05/19/2024 by Vicci Duwaine SQUIBB, DO.  Called Nurse Triage reporting Hyperglycemia and Fatigue.  Symptoms began about a month ago.  Interventions attempted: Prescription medications: insulin /Toujeo .  Symptoms are: Hyperglycemia (3 times in the 400s, currently in 200s), fatigue (she states if she sits down for long enough she will fall asleep), palpitations, shoulder and back discomfort a couple nights ago and last night (resolved now), nausea, increased thirst and urination, productive cough x couple of weeks gradually worsening.  Triage Disposition: See Physician Within 24 Hours (overriding Call PCP Now)  Patient/caregiver understands and will follow disposition?: Yes             Reason for Disposition  [1] Blood glucose > 300 mg/dL (83.2 mmol/L) AND [7] two or more times in a row  Answer Assessment - Initial Assessment Questions Patient states she saw PCP in July for elevated blood glucose. She states she was having high blood sugars but PCP did not want to make changes due to [facility] was improved. She states last night her BG was 433 and 412. She also states Saturday night her BG was 403. Patient has appointment this afternoon with PCP at 2:40pm she states the office called her and scheduled just before she spoke with nurse triage.   1. BLOOD GLUCOSE: What is your blood glucose level?      264.  2. ONSET: When did you check the blood glucose?     This morning before eating or taking medications.  3. USUAL RANGE: What is your glucose level usually? (e.g., usual fasting morning value, usual evening value)     Normal morning value was around 120-150.  4. KETONES: Do you check for ketones (urine or blood test strips)? If Yes, ask: What does the test show now?      N/A.  5. TYPE 1 or 2:  Do you know what type of diabetes you have?  (e.g., Type 1, Type 2, Gestational;  doesn't know)      Type 2.  6. INSULIN : Do you take insulin ? What type of insulin (s) do you use? What is the mode of delivery? (syringe, pen; injection or pump)?      Insulin  7 units once daily Toujeo  solostar pen. No missed doses.  7. DIABETES PILLS: Do you take any pills for your diabetes? If Yes, ask: Have you missed taking any pills recently?     No.  8. OTHER SYMPTOMS: Do you have any symptoms? (e.g., fever, frequent urination, difficulty breathing, dizziness, weakness, vomiting)     Fatigue (she states if she sits down for long enough she will fall asleep), palpitations, shoulder and back discomfort a couple nights ago and last night (resolved now), nausea, increased thirst and urination, productive cough x couple of weeks. Denies chest pain, difficulty breathing, vomiting.  9. PREGNANCY: Is there any chance you are pregnant? When was your last menstrual period?     N/A.  Protocols used: Diabetes - High Blood Sugar-A-AH

## 2024-06-20 NOTE — Assessment & Plan Note (Signed)
 Stressed the importance of telling us  what medications she is taking to be able to appropriately manage her. Will continue 7 units toujeo . Will restart trulicity  and titrate up. Will restart jardiance . Will get pharmacy involved. Recheck 6 weeks. Continue to monitor closely.

## 2024-06-20 NOTE — Progress Notes (Signed)
 BP 130/76 (BP Location: Left Arm, Patient Position: Sitting, Cuff Size: Large)   Pulse 81   Temp 97.8 F (36.6 C) (Oral)   Resp 16   Ht 5' 2.01 (1.575 m)   Wt 128 lb 9.6 oz (58.3 kg)   SpO2 95%   BMI 23.52 kg/m    Subjective:    Patient ID: Brianna Leon, female    DOB: 07/29/1956, 68 y.o.   MRN: [id number]  HPI: Brianna Leon is a 68 y.o. female  Chief Complaint  Patient presents with   Diabetes    Has been running high over the last month but now into the high 300's the last week.    DIABETES- had actually been intermittently been taking left over rybelsus , trulicity  and jardiance  at home from April to July. Ran out of all the other medicine except toujeo  starting in July and her sugars have been going very high since then Hypoglycemic episodes:no Polydipsia/polyuria: yes Visual disturbance: yes Chest pain: no Paresthesias: yes Glucose Monitoring: yes  Accucheck frequency: continuous  Fasting glucose: 268  Evening: 390 Taking Insulin ?: yes  Long acting insulin : 7 units toujeo  Blood Pressure Monitoring: not checking Retinal Examination: Up to Date Foot Exam: Up to Date Diabetic Education: Completed Pneumovax: Up to Date Influenza: Not up to Date Aspirin: no   Relevant past medical, surgical, family and social history reviewed and updated as indicated. Interim medical history since our last visit reviewed. Allergies and medications reviewed and updated.  Review of Systems  Constitutional: Negative.   Respiratory: Negative.    Cardiovascular: Negative.   Musculoskeletal: Negative.   Neurological: Negative.   Psychiatric/Behavioral: Negative.      Per HPI unless specifically indicated above     Objective:    BP 130/76 (BP Location: Left Arm, Patient Position: Sitting, Cuff Size: Large)   Pulse 81   Temp 97.8 F (36.6 C) (Oral)   Resp 16   Ht 5' 2.01 (1.575 m)   Wt 128 lb 9.6 oz (58.3 kg)   SpO2 95%   BMI 23.52 kg/m   Wt Readings from Last 3  Encounters:  06/20/24 128 lb 9.6 oz (58.3 kg)  05/19/24 125 lb 6.4 oz (56.9 kg)  03/28/24 120 lb 12.8 oz (54.8 kg)    Physical Exam Vitals and nursing note reviewed.  Constitutional:      General: She is not in acute distress.    Appearance: Normal appearance. She is not ill-appearing, toxic-appearing or diaphoretic.  HENT:     Head: Normocephalic and atraumatic.     Right Ear: External ear normal.     Left Ear: External ear normal.     Nose: Nose normal.     Mouth/Throat:     Mouth: Mucous membranes are moist.     Pharynx: Oropharynx is clear.  Eyes:     General: No scleral icterus.       Right eye: No discharge.        Left eye: No discharge.     Extraocular Movements: Extraocular movements intact.     Conjunctiva/sclera: Conjunctivae normal.     Pupils: Pupils are equal, round, and reactive to light.  Cardiovascular:     Rate and Rhythm: Normal rate and regular rhythm.     Pulses: Normal pulses.     Heart sounds: Normal heart sounds. No murmur heard.    No friction rub. No gallop.  Pulmonary:     Effort: Pulmonary effort is normal. No respiratory distress.  Breath sounds: Normal breath sounds. No stridor. No wheezing, rhonchi or rales.  Chest:     Chest wall: No tenderness.  Musculoskeletal:        General: Normal range of motion.     Cervical back: Normal range of motion and neck supple.  Skin:    General: Skin is warm and dry.     Capillary Refill: Capillary refill takes less than 2 seconds.     Coloration: Skin is not jaundiced or pale.     Findings: No bruising, erythema, lesion or rash.  Neurological:     General: No focal deficit present.     Mental Status: She is alert and oriented to person, place, and time. Mental status is at baseline.  Psychiatric:        Mood and Affect: Mood normal.        Behavior: Behavior normal.        Thought Content: Thought content normal.        Judgment: Judgment normal.     Results for orders placed or performed in  visit on 06/03/24  HM DIABETES EYE EXAM   Collection Time: 06/02/24  4:43 PM  Result Value Ref Range   HM Diabetic Eye Exam No Retinopathy No Retinopathy      Assessment & Plan:   Problem List Items Addressed This Visit       Endocrine   DM (diabetes mellitus), type 2 (HCC) - Primary   Stressed the importance of telling us  what medications she is taking to be able to appropriately manage her. Will continue 7 units toujeo . Will restart trulicity  and titrate up. Will restart jardiance . Will get pharmacy involved. Recheck 6 weeks. Continue to monitor closely.      Relevant Medications   Dulaglutide  (TRULICITY ) 0.75 MG/0.5ML SOAJ   Dulaglutide  (TRULICITY ) 1.5 MG/0.5ML SOAJ (Start on 07/18/2024)   empagliflozin  (JARDIANCE ) 25 MG TABS tablet     Follow up plan: Return in about 6 weeks (around 08/01/2024).

## 2024-06-23 DIAGNOSIS — I2510 Atherosclerotic heart disease of native coronary artery without angina pectoris: Secondary | ICD-10-CM | POA: Diagnosis not present

## 2024-06-23 DIAGNOSIS — Z794 Long term (current) use of insulin: Secondary | ICD-10-CM | POA: Diagnosis not present

## 2024-06-23 DIAGNOSIS — K219 Gastro-esophageal reflux disease without esophagitis: Secondary | ICD-10-CM | POA: Diagnosis not present

## 2024-06-23 DIAGNOSIS — E119 Type 2 diabetes mellitus without complications: Secondary | ICD-10-CM | POA: Diagnosis not present

## 2024-06-23 DIAGNOSIS — R0789 Other chest pain: Secondary | ICD-10-CM | POA: Diagnosis not present

## 2024-06-23 DIAGNOSIS — E785 Hyperlipidemia, unspecified: Secondary | ICD-10-CM | POA: Diagnosis not present

## 2024-06-24 ENCOUNTER — Other Ambulatory Visit (HOSPITAL_COMMUNITY): Payer: Self-pay

## 2024-06-24 NOTE — Telephone Encounter (Signed)
 Copied from CRM #[id number]. Topic: Clinical - Medication Prior Auth >> Jun 24, 2024  1:32 PM Shardie S wrote: Reason for CRM: Patient states Dulaglutide  (TRULICITY ) 0.75 MG/0.5ML SOAJ needs a prior authorization  Callback # [phone]

## 2024-06-25 NOTE — Telephone Encounter (Signed)
 Pharmacy Patient Advocate Encounter  Received notification from [facility] [employer] that Prior Authorization for Trulicity  0.75MG /0.5ML auto-injectors has been APPROVED from 06/24/2024 to 06/24/2025   PA #/Case ID/Reference #: [id number]

## 2024-06-27 ENCOUNTER — Telehealth: Payer: Self-pay

## 2024-06-27 NOTE — Progress Notes (Signed)
 Care Guide Pharmacy Note  06/27/2024 Name: Brianna Leon MRN: [id number] DOB: 1956/02/11  Referred By: Vicci Duwaine SQUIBB, DO Reason for referral: Complex Care Management (Outreach to schedule with Pharm d)   Brianna Leon is a 68 y.o. year old female who is a primary care patient of Vicci Duwaine SQUIBB, DO.  Brianna Leon was referred to the pharmacist for assistance related to: DMII  An unsuccessful telephone outreach was attempted today to contact the patient who was referred to the pharmacy team for assistance with medication management. Additional attempts will be made to contact the patient.  Jeoffrey Buffalo , RMA     [facility]  [facility], [facility] Guide  Direct Dial: [phone]  Website: [email or web address]

## 2024-06-30 NOTE — Progress Notes (Signed)
 Care Guide Pharmacy Note  06/30/2024 Name: Brianna Leon MRN: [id number] DOB: 01-27-1956  Referred By: Vicci Duwaine SQUIBB, DO Reason for referral: Complex Care Management (Outreach to schedule with Pharm d)   Brianna Leon is a 68 y.o. year old female who is a primary care patient of Vicci Duwaine SQUIBB, DO.  Brianna Leon was referred to the pharmacist for assistance related to: DMII  Successful contact was made with the patient to discuss pharmacy services including being ready for the pharmacist to call at least 5 minutes before the scheduled appointment time and to have medication bottles and any blood pressure readings ready for review. The patient agreed to meet with the pharmacist via telephone visit on (date/time).07/14/2024  Jeoffrey Buffalo , RMA     [place]  [facility], [facility] Guide  Direct Dial: [phone]  Website: [email or web address]

## 2024-07-14 ENCOUNTER — Other Ambulatory Visit: Payer: Self-pay

## 2024-07-14 DIAGNOSIS — R809 Proteinuria, unspecified: Secondary | ICD-10-CM

## 2024-07-14 NOTE — Progress Notes (Signed)
 07/14/2024 Name: Brianna Leon MRN: [id number] DOB: 02-11-1956  Chief Complaint  Patient presents with   Diabetes Management Plan   Brianna Leon is a 68 y.o. year old female who presented for a telephone visit.   They were referred to the pharmacist by a quality report for assistance in managing adherence.   Subjective:  Care Team: Primary Care Provider: Vicci Duwaine SQUIBB, DO ; Next Scheduled Visit: 07/24/2024  Medication Access/Adherence  Current Pharmacy:  [id number] [address], [place] - [address] AT [facility] [address] [place] [address] Phone: [phone] Fax: [phone]  -Patient reports affordability concerns with their medications: No  -Patient reports access/transportation concerns to their pharmacy: No  -Patient reports adherence concerns with their medications:  No    Diabetes: Current medications: Trulicity  0.75mg  weekly, Jardiance  25mg  daily, Toujeo  300unit/mL -Medications tried in the past: metformin caused diarrhea -Using Libre 3 for CGM -Current glucose readings: 200-300; lowest reading she has seen in the past 2 weeks is 160 -Patient was contacted 8/12 in regard to adherence to simvastatin  (had been nonadherent but is now taking daily).  During conversation with PharmD she mentioned that she also had not been taking Jardiance  or Trulicity , only 7 units of Toujeo  for diabetes control. -Trulicity  resumed at 0.75mg  weekly and Jardiance  25mg  daily after follow-up with PCP on 8/15 -Patient endorses that she took her 3rd dose of Trulicity  0.75mg  yesterday and is tolerating this and Jardiance  25mg  daily so far. -Patient denies hypoglycemic s/sx including dizziness, shakiness, sweating.  -Patient reports hyperglycemic symptoms including polyuria and polydipsia  -No affordability concerns related to current regimen  Macrovascular and Microvascular Risk Reduction:  Statin? yes (simvastatin  40mg   daily); ACEi/ARB? no Last urinary albumin/creatinine ratio:  Lab Results  Component Value Date   MICRALBCREAT <30 02/08/2024   MICRALBCREAT 30-300 (H) 02/02/2023   MICRALBCREAT 30-300 (H) 08/04/2022   MICRALBCREAT <30 02/01/2022   MICRALBCREAT <30 02/11/2021   MICRALBCREAT <30 04/23/2020   MICRALBCREAT <30 08/08/2019   MICRALBCREAT <30 03/14/2019   MICRALBCREAT <30 12/14/2017   Last eye exam:  Lab Results  Component Value Date   HMDIABEYEEXA No Retinopathy 06/02/2024   Last foot exam: 02/08/2024 Tobacco Use:  Tobacco Use: Low Risk  (06/23/2024)   Received from [facility]   Patient History    Smoking Tobacco Use: Never    Smokeless Tobacco Use: Never    Passive Exposure: Not on file    Objective: Lab Results  Component Value Date   HGBA1C 6.5 (H) 05/19/2024   Lab Results  Component Value Date   CREATININE 0.69 02/08/2024   BUN 10 02/08/2024   NA 138 02/08/2024   K 4.1 02/08/2024   CL 99 02/08/2024   CO2 21 02/08/2024   Lab Results  Component Value Date   CHOL 142 02/08/2024   HDL 37 (L) 02/08/2024   LDLCALC 87 02/08/2024   TRIG 97 02/08/2024   Medications Reviewed Today     Reviewed by Deanna Channing LABOR, RPH (Pharmacist) on 07/14/24 at 1440  Med List Status: <None>   Medication Order Taking? Sig Documenting Provider Last Dose Status Informant  acetaminophen  (TYLENOL ) 500 MG tablet [id number]  Take 2 tablets (1,000 mg total) by mouth every 6 (six) hours. Simaan, Elizabeth S, PA-C  Active   albuterol  (VENTOLIN  HFA) 108 (90 Base) MCG/ACT inhaler [id number]  Inhale 2 puffs into the lungs every 6 (six) hours as needed for  wheezing or shortness of breath. Johnson, Megan P, DO  Active   Ascorbic Acid (CHEWABLE VITAMIN C PO) [id number]  Take by mouth daily. [provider]  Active   aspirin EC 81 MG tablet [id number]  Take 81 mg by mouth daily. [provider]  Active Self  blood glucose meter kit and supplies KIT [id number]  Dispense  based on patient and insurance preference. Use up to four times daily as directed. (FOR ICD-9 250.00, 250.01). Brianna Bouchard P, DO  Active   Blood Glucose Monitoring Suppl DEVI [id number]  1 each by Does not apply route in the morning, at noon, and at bedtime. May substitute to any manufacturer covered by patient's insurance. Johnson, Megan P, DO  Active   Continuous Glucose Sensor (FREESTYLE LIBRE 3 SENSOR) [place] [id number] Yes every 14 (fourteen) days. [provider]  Active   docusate sodium  (COLACE) 100 MG capsule [id number]  Take 1 capsule (100 mg total) by mouth 2 (two) times daily. Simaan, Elizabeth S, PA-C  Active   Dulaglutide  (TRULICITY ) 0.75 MG/0.5ML EMMANUEL [id number] Yes Inject 0.75 mg into the skin once a week. Johnson, Megan P, DO  Active   Dulaglutide  (TRULICITY ) 1.5 MG/0.5ML EMMANUEL [id number]  Inject 1.5 mg into the skin once a week. Brianna, Megan P, DO  Active   empagliflozin  (JARDIANCE ) 25 MG TABS tablet [id number] Yes Take 1 tablet (25 mg total) by mouth daily before breakfast. Brianna, Megan P, DO  Active   EPINEPHrine  0.3 mg/0.3 mL IJ SOAJ injection [id number]  INJECT INTRAMUSCULARLY AS DIRECTED Brianna, Megan P, DO  Active   escitalopram  (LEXAPRO ) 20 MG tablet [id number]  TAKE 1 TABLET(20 MG) BY MOUTH DAILY Johnson, Megan P, DO  Active   fexofenadine (ALLEGRA) 180 MG tablet [id number]  Take 180 mg by mouth daily. [provider]  Active   Glucagon  HCl (GLUCAGON  EMERGENCY) 1 MG/ML SOLR [id number]  Inject 1 mg as directed once as needed for up to 1 dose. Johnson, Megan P, DO  Active   ibuprofen (ADVIL) 600 MG tablet [id number]  Take 600 mg by mouth every 6 (six) hours as needed. [provider]  Active   insulin  glargine, 1 Unit Dial, (TOUJEO  SOLOSTAR) 300 UNIT/ML Solostar Pen [id number] Yes Inject 7 Units into the skin daily.  Patient taking differently: Inject 10 Units into the skin daily.   Johnson, Megan P, DO  Active   Insulin  Pen Needle (ASSURE ID DUO PRO PEN  NEEDLES) 31G X 5 MM MISC [id number] Yes 1 each by Does not apply route daily. Brianna, Megan P, DO  Active   LORazepam  (ATIVAN ) 0.5 MG tablet [id number]  Take 1 tablet (0.5 mg total) by mouth 2 (two) times daily as needed for anxiety. Johnson, Megan P, DO  Active   Multiple Vitamins-Minerals (OCUVITE ADULT 50+ PO) [id number]  Take 1 capsule by mouth daily. [provider]  Active Self  Multiple Vitamins-Minerals (ZINC PO) [id number]  Take by mouth daily. [provider]  Active Self  omeprazole  (PRILOSEC) 40 MG capsule [id number]  Take 1 capsule (40 mg total) by mouth daily. Johnson, Megan P, DO  Active   ondansetron  (ZOFRAN -ODT) 4 MG disintegrating tablet [id number]  Take 1 tablet (4 mg total) by mouth every 8 (eight) hours as needed for nausea or vomiting. Viviann Pastor, MD  Active   simvastatin  (ZOCOR ) 40 MG tablet [id number]  TAKE 1 TABLET(40 MG) BY MOUTH DAILY AT 6 PM Brianna Bouchard [place], [place]  Active   UNABLE TO FIND [id number]  Take by mouth daily. Vitamin D3, vitamin B, Bioflex [provider]  Active Self  UNABLE TO FIND [id number]  Take by mouth daily. Ocutive eye vitamins, cinnamon vitamins [provider]  Active Self           Assessment/Plan:   Diabetes: -Currently controlled based on last A1c, but home BG readings reflect not at goal. Cardiorenal risk reduction is optimized.. Blood pressure is at goal <130/80. LDL is at goal.  -Increase Toujeo  to 10 units daily -Continue Trulicity  0.75mg  for another week (to complete 4 weeks), then increase to 1.5mg  weekly (prescription on file at pharmacy) -Continue Jardiance  25mg  daily -Continue Libre 3 for CGM -Patient sees PCP again 9/18; based on BG readings at that time, may be able to decrease Toujeo  back to 7 or 8 units daily  Follow Up Plan: 10/8  Channing DELENA Mealing, PharmD, DPLA

## 2024-07-16 DIAGNOSIS — E119 Type 2 diabetes mellitus without complications: Secondary | ICD-10-CM | POA: Diagnosis not present

## 2024-07-16 DIAGNOSIS — Z794 Long term (current) use of insulin: Secondary | ICD-10-CM | POA: Diagnosis not present

## 2024-07-16 DIAGNOSIS — Z23 Encounter for immunization: Secondary | ICD-10-CM | POA: Diagnosis not present

## 2024-07-17 ENCOUNTER — Telehealth: Payer: Self-pay

## 2024-07-17 NOTE — Progress Notes (Signed)
   07/17/2024  Patient ID: Brianna Leon, female   DOB: [birth year], 68 y.o.   MRN: [id number]  In basket message stating patient was requesting a telephone call from me in regard to our upcoming telephone visit.  Patient recently established care with endocrinology for diabetes management; plan from patient's visit with Dr. Carlin at [facility] is as follows:  Continue trulicity  0.75 mg weekly. Increase to 1.5 mg weekly after 4 weeks total. Let me know when you are ready to go up to 3 mg weekly.  Increase Toujeo  to 12 units daily  How to adjust your basal (long acting) insulin : If morning fasting blood sugar is more than 150 most days, increase Toujeo  by 2 units. You can do this once a week. If morning fasting blood sugar is less than 100 most days, decrease Toujeo  by 2 units. You can do this once a week. Continue Jardiance  25 mg daily   Patient will see endocrinology again in December, but she has canceled her October follow-up with Dr Vicci and would like to cancel her telephone visit with me scheduled for later this month.  I have updated the patient's mediation list to reflect her current diabetes regimen.  I advised the patient to schedule a follow-up with Dr. Vicci in November (3 months from her most recent visit), so she can continue to follow the patient- she plans to call the office to do so.  I have canceled my telephone visit with her but provided her with my direct number, so she can contact me with any medication questions, concerns or needs.  Brianna Leon, PharmD, DPLA

## 2024-07-23 ENCOUNTER — Ambulatory Visit
Admission: RE | Admit: 2024-07-23 | Discharge: 2024-07-23 | Disposition: A | Discharge status: Home / Self Care | Source: Ambulatory Visit | Attending: Family Medicine | Admitting: Family Medicine

## 2024-07-23 DIAGNOSIS — Z1231 Encounter for screening mammogram for malignant neoplasm of breast: Secondary | ICD-10-CM | POA: Insufficient documentation

## 2024-07-24 ENCOUNTER — Ambulatory Visit: Admitting: Family Medicine

## 2024-07-25 ENCOUNTER — Ambulatory Visit: Payer: Self-pay | Admitting: Family Medicine

## 2024-08-04 ENCOUNTER — Ambulatory Visit: Admitting: Family Medicine

## 2024-08-07 NOTE — Progress Notes (Signed)
   08/07/2024  Patient ID: Brianna Leon, female   DOB: [birth year], 68 y.o.   MRN: [id number]  This patient is appearing on a report for being at risk of failing the adherence measure for cholesterol (statin) medications this calendar year.   Medication: Simvastatin  40 mg tablets Last fill date: 06/21/2024 for 90 day supply  Insurance report was not up to date. No action needed at this time.   Brianna Leon [facility] PharmD Candidate Class of [phone]

## 2024-08-13 ENCOUNTER — Other Ambulatory Visit

## 2024-08-22 ENCOUNTER — Ambulatory Visit: Admitting: Family Medicine

## 2024-09-08 ENCOUNTER — Ambulatory Visit: Admitting: Family Medicine

## 2024-09-08 VITALS — BP 138/62 | HR 66 | Temp 98.2°F | Ht 62.0 in | Wt 135.6 lb

## 2024-09-08 DIAGNOSIS — Z23 Encounter for immunization: Secondary | ICD-10-CM | POA: Diagnosis not present

## 2024-09-08 DIAGNOSIS — F419 Anxiety disorder, unspecified: Secondary | ICD-10-CM | POA: Diagnosis not present

## 2024-09-08 DIAGNOSIS — E1129 Type 2 diabetes mellitus with other diabetic kidney complication: Secondary | ICD-10-CM

## 2024-09-08 DIAGNOSIS — E782 Mixed hyperlipidemia: Secondary | ICD-10-CM | POA: Diagnosis not present

## 2024-09-08 DIAGNOSIS — Z7985 Long-term (current) use of injectable non-insulin antidiabetic drugs: Secondary | ICD-10-CM

## 2024-09-08 DIAGNOSIS — R809 Proteinuria, unspecified: Secondary | ICD-10-CM | POA: Diagnosis not present

## 2024-09-08 LAB — BAYER DCA HB A1C WAIVED: HB A1C (BAYER DCA - WAIVED): 7.4 % — ABNORMAL HIGH (ref 4.8–5.6)

## 2024-09-08 MED ORDER — ROSUVASTATIN CALCIUM 20 MG PO TABS: 20.0000 mg | ORAL_TABLET | Freq: Every day | ORAL | 1 refills | Status: AC

## 2024-09-08 MED ORDER — OMEPRAZOLE 40 MG PO CPDR: 40.0000 mg | DELAYED_RELEASE_CAPSULE | Freq: Every day | ORAL | 1 refills | Status: AC

## 2024-09-08 MED ORDER — TRULICITY 3 MG/0.5ML ~~LOC~~ SOAJ: 3.0000 mg | SUBCUTANEOUS | 0 refills | Status: AC

## 2024-09-08 MED ORDER — ESCITALOPRAM OXALATE 20 MG PO TABS: ORAL_TABLET | ORAL | 1 refills | Status: AC

## 2024-09-08 MED ORDER — EMPAGLIFLOZIN 25 MG PO TABS: 25.0000 mg | ORAL_TABLET | Freq: Every day | ORAL | 3 refills | Status: AC

## 2024-09-08 MED ORDER — LORAZEPAM 0.5 MG PO TABS: 0.5000 mg | ORAL_TABLET | Freq: Two times a day (BID) | ORAL | 1 refills | Status: AC | PRN

## 2024-09-08 NOTE — Assessment & Plan Note (Signed)
 Under good control on current regimen. Continue current regimen. Continue to monitor. Call with any concerns. Refills given.

## 2024-09-08 NOTE — Assessment & Plan Note (Signed)
 Under good control on current regimen. Continue current regimen. Continue to monitor. Call with any concerns. Refills given. Labs drawn today.

## 2024-09-08 NOTE — Progress Notes (Signed)
 BP 138/62   Pulse 66   Temp 98.2 F (36.8 C) (Oral)   Ht 5' 2 (1.575 m)   Wt 135 lb 9.6 oz (61.5 kg)   SpO2 97%   BMI 24.80 kg/m    Subjective:    Patient ID: Brianna Leon, female    DOB: [birth year], 68 y.o.   MRN: [id number]  HPI: Brianna Leon is a 68 y.o. female  Chief Complaint  Patient presents with   Diabetes   DIABETES- following with endocrine now Hypoglycemic episodes:no Polydipsia/polyuria: no Visual disturbance: no Chest pain: no Paresthesias: no Glucose Monitoring: yes  Accucheck frequency: continuous Taking Insulin ?: yes  Long acting insulin : 14 units insulin  Blood Pressure Monitoring: not checking Retinal Examination: Up to Date Foot Exam: Up to Date Diabetic Education: Completed Pneumovax: Up to Date Influenza: Up to Date Aspirin: yes  HYPERLIPIDEMIA Hyperlipidemia status: excellent compliance Satisfied with current treatment?  yes Side effects:  no Medication compliance: excellent compliance Past cholesterol meds: crestor Supplements: none Aspirin:  yes The 10-year ASCVD risk score (Arnett DK, et al., 2019) is: 13.2%   Values used to calculate the score:     Age: 36 years     Clincally relevant sex: Female     Is Non-Hispanic African American: No     Diabetic: Yes     Tobacco smoker: No     Systolic Blood Pressure: 138 mmHg     Is BP treated: No     HDL Cholesterol: 37 mg/dL     Total Cholesterol: 142 mg/dL Chest pain:  no Coronary artery disease:  yes  ANXIETY/STRESS- having some issues with her grand-daughter and school Duration: chronic Status:exacerbated Anxious mood: yes  Excessive worrying: yes Irritability: no  Sweating: no Nausea: no Palpitations:no Hyperventilation: no Panic attacks: no Agoraphobia: no  Obscessions/compulsions: no Depressed mood: no    09/08/2024    8:54 AM 06/20/2024    3:07 PM 05/19/2024    3:09 PM 03/28/2024    3:24 PM 02/08/2024    2:51 PM  Depression screen PHQ 2/9  Decreased Interest 1  0 0 0 1  Down, Depressed, Hopeless 1 0 0 0 1  PHQ - 2 Score 2 0 0 0 2  Altered sleeping 1  1 0 1  Tired, decreased energy 1  2 0 1  Change in appetite 1  0 0 1  Feeling bad or failure about yourself  0  0 0 1  Trouble concentrating 0  0 0 0  Moving slowly or fidgety/restless 0  0 0 0  Suicidal thoughts 0  0 0 0  PHQ-9 Score 5  3 0 6  Difficult doing work/chores Not difficult at all  Somewhat difficult Not difficult at all Somewhat difficult      09/08/2024    8:57 AM 06/20/2024    3:07 PM 05/19/2024    3:10 PM 03/28/2024    3:25 PM  GAD 7 : Generalized Anxiety Score  Nervous, Anxious, on Edge 2 0 0 0  Control/stop worrying 2 0 0 0  Worry too much - different things 2 0 0 0  Trouble relaxing 1 0 0 0  Restless  0 0 0  Easily annoyed or irritable 1 0 0 0  Afraid - awful might happen 3 0 0 0  Total GAD 7 Score  0 0 0  Anxiety Difficulty Somewhat difficult   Not difficult at all   Anhedonia: no Weight changes: no Insomnia: no   Hypersomnia:  no Fatigue/loss of energy: yes Feelings of worthlessness: no Feelings of guilt: no Impaired concentration/indecisiveness: no Suicidal ideations: no  Crying spells: no Recent Stressors/Life Changes: yes   Relationship problems: no   Family stress: yes     Financial stress: no    Job stress: no    Recent death/loss: no  Relevant past medical, surgical, family and social history reviewed and updated as indicated. Interim medical history since our last visit reviewed. Allergies and medications reviewed and updated.  Review of Systems  Constitutional: Negative.   Respiratory: Negative.    Cardiovascular: Negative.   Musculoskeletal: Negative.   Neurological: Negative.   Psychiatric/Behavioral: Negative.      Per HPI unless specifically indicated above     Objective:    BP 138/62   Pulse 66   Temp 98.2 F (36.8 C) (Oral)   Ht 5' 2 (1.575 m)   Wt 135 lb 9.6 oz (61.5 kg)   SpO2 97%   BMI 24.80 kg/m   Wt Readings from Last 3  Encounters:  09/08/24 135 lb 9.6 oz (61.5 kg)  06/20/24 128 lb 9.6 oz (58.3 kg)  05/19/24 125 lb 6.4 oz (56.9 kg)    Physical Exam Vitals and nursing note reviewed.  Constitutional:      General: She is not in acute distress.    Appearance: Normal appearance. She is normal weight. She is not ill-appearing, toxic-appearing or diaphoretic.  HENT:     Head: Normocephalic and atraumatic.     Right Ear: External ear normal.     Left Ear: External ear normal.     Nose: Nose normal.     Mouth/Throat:     Mouth: Mucous membranes are moist.     Pharynx: Oropharynx is clear.  Eyes:     General: No scleral icterus.       Right eye: No discharge.        Left eye: No discharge.     Extraocular Movements: Extraocular movements intact.     Conjunctiva/sclera: Conjunctivae normal.     Pupils: Pupils are equal, round, and reactive to light.  Cardiovascular:     Rate and Rhythm: Normal rate and regular rhythm.     Pulses: Normal pulses.     Heart sounds: Normal heart sounds. No murmur heard.    No friction rub. No gallop.  Pulmonary:     Effort: Pulmonary effort is normal. No respiratory distress.     Breath sounds: Normal breath sounds. No stridor. No wheezing, rhonchi or rales.  Chest:     Chest wall: No tenderness.  Musculoskeletal:        General: Normal range of motion.     Cervical back: Normal range of motion and neck supple.  Skin:    General: Skin is warm and dry.     Capillary Refill: Capillary refill takes less than 2 seconds.     Coloration: Skin is not jaundiced or pale.     Findings: No bruising, erythema, lesion or rash.  Neurological:     General: No focal deficit present.     Mental Status: She is alert and oriented to person, place, and time. Mental status is at baseline.  Psychiatric:        Mood and Affect: Mood normal.        Behavior: Behavior normal.        Thought Content: Thought content normal.        Judgment: Judgment normal.     Results for orders  placed or performed in visit on 06/03/24  HM DIABETES EYE EXAM   Collection Time: 06/02/24  4:43 PM  Result Value Ref Range   HM Diabetic Eye Exam No Retinopathy No Retinopathy      Assessment & Plan:   Problem List Items Addressed This Visit       Endocrine   DM (diabetes mellitus), type 2 (HCC)   A1c elevated to 7.4 from 6.5. Following with endocrinology. Will increase her trulicity  to 3mg  and continue to follow with them.       Relevant Medications   BAQSIMI  ONE PACK 3 MG/DOSE POWD   Dulaglutide  (TRULICITY ) 3 MG/0.5ML SOAJ   empagliflozin  (JARDIANCE ) 25 MG TABS tablet   rosuvastatin (CRESTOR) 20 MG tablet   Other Relevant Orders   Bayer DCA Hb A1c Waived   CBC with Differential/Platelet   Comprehensive metabolic panel with GFR   Lipid Panel w/o Chol/HDL Ratio     Other   Hyperlipidemia - Primary   Under good control on current regimen. Continue current regimen. Continue to monitor. Call with any concerns. Refills given. Labs drawn today.        Relevant Medications   rosuvastatin (CRESTOR) 20 MG tablet   Other Relevant Orders   Lipid Panel w/o Chol/HDL Ratio   Anxiety   Under good control on current regimen. Continue current regimen. Continue to monitor. Call with any concerns. Refills given.        Relevant Medications   escitalopram  (LEXAPRO ) 20 MG tablet   LORazepam  (ATIVAN ) 0.5 MG tablet   Other Visit Diagnoses       Needs flu shot       Flu shot given today.   Relevant Orders   Flu vaccine HIGH DOSE PF(Fluzone Trivalent)        Follow up plan: Return in about 22 weeks (around 02/09/2025) for physical.

## 2024-09-08 NOTE — Assessment & Plan Note (Signed)
 A1c elevated to 7.4 from 6.5. Following with endocrinology. Will increase her trulicity  to 3mg  and continue to follow with them.

## 2024-09-09 LAB — CBC WITH DIFFERENTIAL/PLATELET
Basophils Absolute: 0.1 x10E3/uL (ref 0.0–0.2)
Basos: 1 %
EOS (ABSOLUTE): 0.4 x10E3/uL (ref 0.0–0.4)
Eos: 4 %
Hematocrit: 41 % (ref 34.0–46.6)
Hemoglobin: 13 g/dL (ref 11.1–15.9)
Immature Grans (Abs): 0 x10E3/uL (ref 0.0–0.1)
Immature Granulocytes: 0 %
Lymphocytes Absolute: 2.7 x10E3/uL (ref 0.7–3.1)
Lymphs: 29 %
MCH: 31.9 pg (ref 26.6–33.0)
MCHC: 31.7 g/dL (ref 31.5–35.7)
MCV: 101 fL — ABNORMAL HIGH (ref 79–97)
Monocytes Absolute: 0.6 x10E3/uL (ref 0.1–0.9)
Monocytes: 7 %
Neutrophils Absolute: 5.5 x10E3/uL (ref 1.4–7.0)
Neutrophils: 59 %
Platelets: 238 x10E3/uL (ref 150–450)
RBC: 4.08 x10E6/uL (ref 3.77–5.28)
RDW: 11.3 % — ABNORMAL LOW (ref 11.7–15.4)
WBC: 9.2 x10E3/uL (ref 3.4–10.8)

## 2024-09-09 LAB — COMPREHENSIVE METABOLIC PANEL WITH GFR
ALT: 12 IU/L (ref 0–32)
AST: 14 IU/L (ref 0–40)
Albumin: 4.2 g/dL (ref 3.9–4.9)
Alkaline Phosphatase: 82 IU/L (ref 49–135)
BUN/Creatinine Ratio: 26 (ref 12–28)
BUN: 17 mg/dL (ref 8–27)
Bilirubin Total: 0.3 mg/dL (ref 0.0–1.2)
CO2: 25 mmol/L (ref 20–29)
Calcium: 9.4 mg/dL (ref 8.7–10.3)
Chloride: 101 mmol/L (ref 96–106)
Creatinine, Ser: 0.65 mg/dL (ref 0.57–1.00)
Globulin, Total: 2.6 g/dL (ref 1.5–4.5)
Glucose: 189 mg/dL — ABNORMAL HIGH (ref 70–99)
Potassium: 4.5 mmol/L (ref 3.5–5.2)
Sodium: 140 mmol/L (ref 134–144)
Total Protein: 6.8 g/dL (ref 6.0–8.5)
eGFR: 97 mL/min/1.73 (ref >59)

## 2024-09-09 LAB — LIPID PANEL W/O CHOL/HDL RATIO
Cholesterol, Total: 124 mg/dL (ref 100–199)
HDL: 39 mg/dL — ABNORMAL LOW (ref >39)
LDL Chol Calc (NIH): 61 mg/dL (ref 0–99)
Triglycerides: 138 mg/dL (ref 0–149)
VLDL Cholesterol Cal: 24 mg/dL (ref 5–40)

## 2024-09-11 ENCOUNTER — Ambulatory Visit: Payer: Self-pay | Admitting: Family Medicine

## 2024-09-17 DIAGNOSIS — Z4689 Encounter for fitting and adjustment of other specified devices: Secondary | ICD-10-CM | POA: Diagnosis not present

## 2024-09-17 DIAGNOSIS — N8111 Cystocele, midline: Secondary | ICD-10-CM | POA: Diagnosis not present

## 2024-09-30 ENCOUNTER — Telehealth: Payer: Self-pay

## 2024-09-30 NOTE — Progress Notes (Signed)
   09/30/2024  Patient ID: Brianna Leon, female   DOB: 27-Nov-1955, 68 y.o.   MRN: [id number]  This patient is appearing on a report for being at risk of failing the adherence measure for cholesterol (statin) medications this calendar year.   Medication: rosuvastatin  20mg  Last fill date: 07/16/24 for 90 day supply  MyChart message sent to patient.  Brianna Leon, PharmD, DPLA

## 2024-10-10 ENCOUNTER — Telehealth: Payer: Self-pay

## 2024-10-10 NOTE — Progress Notes (Signed)
   10/10/2024  Patient ID: Brianna Leon, female   DOB: [birth year], 68 y.o.   MRN: [id number]  This patient is appearing on a report for being at risk of failing the adherence measure for cholesterol (statin) medications this calendar year.   Medication: rosuvastatin  20mg  Last fill date: 07/16/24 for 90 day supply  Contacted [facility] to facilitate refill.  Brianna Leon, PharmD, DPLA

## 2025-02-09 ENCOUNTER — Encounter: Admitting: Family Medicine
